# Patient Record
Sex: Female | Born: 1955 | Hispanic: No | Smoking: Former smoker
Health system: Southern US, Community
[De-identification: ages and names within clinical notes are randomized; demographics above are authoritative.]

## PROBLEM LIST (undated history)

## (undated) DIAGNOSIS — Z9989 Dependence on other enabling machines and devices: Secondary | ICD-10-CM

## (undated) DIAGNOSIS — N182 Chronic kidney disease, stage 2 (mild): Secondary | ICD-10-CM

## (undated) DIAGNOSIS — H53453 Other localized visual field defect, bilateral: Secondary | ICD-10-CM

## (undated) DIAGNOSIS — Z972 Presence of dental prosthetic device (complete) (partial): Secondary | ICD-10-CM

## (undated) DIAGNOSIS — R531 Weakness: Secondary | ICD-10-CM

## (undated) DIAGNOSIS — I10 Essential (primary) hypertension: Secondary | ICD-10-CM

## (undated) DIAGNOSIS — N200 Calculus of kidney: Secondary | ICD-10-CM

## (undated) DIAGNOSIS — I6930 Unspecified sequelae of cerebral infarction: Secondary | ICD-10-CM

## (undated) DIAGNOSIS — F419 Anxiety disorder, unspecified: Secondary | ICD-10-CM

## (undated) HISTORY — DX: Weakness: R53.1

## (undated) HISTORY — PX: TROCHANTERIC BURSA EXCISION: SHX2581

## (undated) HISTORY — DX: Anxiety disorder, unspecified: F41.9

---

## 1995-04-27 HISTORY — PX: BREAST LUMPECTOMY: SHX2

## 2000-04-26 HISTORY — PX: LUMBAR SPINE SURGERY: SHX701

## 2005-04-26 DIAGNOSIS — I639 Cerebral infarction, unspecified: Secondary | ICD-10-CM

## 2005-04-26 HISTORY — DX: Cerebral infarction, unspecified: I63.9

## 2005-12-25 DIAGNOSIS — I6930 Unspecified sequelae of cerebral infarction: Secondary | ICD-10-CM

## 2005-12-25 HISTORY — DX: Unspecified sequelae of cerebral infarction: I69.30

## 2006-01-04 ENCOUNTER — Encounter: Admission: RE | Admit: 2006-01-04 | Discharge: 2006-03-14 | Payer: Self-pay | Admitting: Family Medicine

## 2008-04-26 HISTORY — PX: FOOT SURGERY: SHX648

## 2010-09-25 ENCOUNTER — Other Ambulatory Visit (HOSPITAL_COMMUNITY): Payer: Self-pay | Admitting: Podiatry

## 2010-09-25 DIAGNOSIS — S92902A Unspecified fracture of left foot, initial encounter for closed fracture: Secondary | ICD-10-CM

## 2010-10-05 ENCOUNTER — Encounter (HOSPITAL_COMMUNITY): Payer: Self-pay

## 2010-10-05 ENCOUNTER — Inpatient Hospital Stay (HOSPITAL_COMMUNITY): Admission: RE | Admit: 2010-10-05 | Payer: Self-pay | Source: Ambulatory Visit

## 2010-10-20 ENCOUNTER — Other Ambulatory Visit (HOSPITAL_COMMUNITY): Payer: Self-pay

## 2010-10-20 ENCOUNTER — Encounter (HOSPITAL_COMMUNITY): Payer: Self-pay

## 2010-11-12 ENCOUNTER — Other Ambulatory Visit: Payer: Self-pay | Admitting: Neurology

## 2010-11-12 DIAGNOSIS — M545 Low back pain: Secondary | ICD-10-CM

## 2010-11-18 ENCOUNTER — Ambulatory Visit
Admission: RE | Admit: 2010-11-18 | Discharge: 2010-11-18 | Disposition: A | Discharge status: Home / Self Care | Payer: Medicare Other | Source: Ambulatory Visit | Attending: Neurology | Admitting: Neurology

## 2010-11-18 DIAGNOSIS — M545 Low back pain: Secondary | ICD-10-CM

## 2010-11-18 MED ORDER — GADOBENATE DIMEGLUMINE 529 MG/ML IV SOLN
18.0000 mL | Freq: Once | INTRAVENOUS | Status: AC | PRN
  Administered 2010-11-18: 18 mL via INTRAVENOUS

## 2010-11-20 ENCOUNTER — Other Ambulatory Visit: Payer: Self-pay | Admitting: Neurology

## 2010-11-20 DIAGNOSIS — M5410 Radiculopathy, site unspecified: Secondary | ICD-10-CM

## 2010-11-24 ENCOUNTER — Ambulatory Visit
Admission: RE | Admit: 2010-11-24 | Discharge: 2010-11-24 | Disposition: A | Discharge status: Home / Self Care | Payer: Medicare Other | Source: Ambulatory Visit | Attending: Neurology | Admitting: Neurology

## 2010-11-24 DIAGNOSIS — M5410 Radiculopathy, site unspecified: Secondary | ICD-10-CM

## 2010-11-24 MED ORDER — IOHEXOL 180 MG/ML  SOLN
1.0000 mL | Freq: Once | INTRAMUSCULAR | Status: AC | PRN
  Administered 2010-11-24: 1 mL via EPIDURAL

## 2010-11-24 MED ORDER — METHYLPREDNISOLONE ACETATE 40 MG/ML INJ SUSP (RADIOLOG
120.0000 mg | Freq: Once | INTRAMUSCULAR | Status: AC
  Administered 2010-11-24: 120 mg via EPIDURAL

## 2011-08-17 ENCOUNTER — Other Ambulatory Visit (HOSPITAL_COMMUNITY)
Admission: RE | Admit: 2011-08-17 | Discharge: 2011-08-17 | Disposition: A | Discharge status: Home / Self Care | Payer: Medicare Other | Source: Ambulatory Visit | Attending: Family Medicine | Admitting: Family Medicine

## 2011-08-17 ENCOUNTER — Other Ambulatory Visit (HOSPITAL_COMMUNITY): Payer: Self-pay | Admitting: Family Medicine

## 2011-08-17 DIAGNOSIS — Z124 Encounter for screening for malignant neoplasm of cervix: Secondary | ICD-10-CM | POA: Insufficient documentation

## 2011-08-17 DIAGNOSIS — Z1231 Encounter for screening mammogram for malignant neoplasm of breast: Secondary | ICD-10-CM

## 2011-09-09 ENCOUNTER — Ambulatory Visit (HOSPITAL_COMMUNITY)
Admission: RE | Admit: 2011-09-09 | Discharge: 2011-09-09 | Disposition: A | Discharge status: Home / Self Care | Payer: Medicare Other | Source: Ambulatory Visit | Attending: Family Medicine | Admitting: Family Medicine

## 2011-09-09 DIAGNOSIS — Z1231 Encounter for screening mammogram for malignant neoplasm of breast: Secondary | ICD-10-CM | POA: Insufficient documentation

## 2011-11-17 ENCOUNTER — Other Ambulatory Visit (HOSPITAL_COMMUNITY): Payer: Self-pay | Admitting: Internal Medicine

## 2011-11-17 DIAGNOSIS — M25552 Pain in left hip: Secondary | ICD-10-CM

## 2011-11-21 ENCOUNTER — Ambulatory Visit
Admission: RE | Admit: 2011-11-21 | Discharge: 2011-11-21 | Disposition: A | Discharge status: Home / Self Care | Payer: Medicare Other | Source: Ambulatory Visit | Attending: Internal Medicine | Admitting: Internal Medicine

## 2011-11-21 DIAGNOSIS — M25552 Pain in left hip: Secondary | ICD-10-CM

## 2012-07-24 ENCOUNTER — Other Ambulatory Visit: Payer: Self-pay | Admitting: Internal Medicine

## 2012-07-24 DIAGNOSIS — E2839 Other primary ovarian failure: Secondary | ICD-10-CM

## 2012-09-04 ENCOUNTER — Other Ambulatory Visit (HOSPITAL_COMMUNITY): Payer: Self-pay | Admitting: Internal Medicine

## 2012-09-04 DIAGNOSIS — Z1231 Encounter for screening mammogram for malignant neoplasm of breast: Secondary | ICD-10-CM

## 2012-09-13 ENCOUNTER — Ambulatory Visit (HOSPITAL_COMMUNITY)
Admission: RE | Admit: 2012-09-13 | Discharge: 2012-09-13 | Disposition: A | Discharge status: Home / Self Care | Payer: Medicare Other | Source: Ambulatory Visit | Attending: Internal Medicine | Admitting: Internal Medicine

## 2012-09-13 DIAGNOSIS — Z1231 Encounter for screening mammogram for malignant neoplasm of breast: Secondary | ICD-10-CM

## 2012-09-13 DIAGNOSIS — E2839 Other primary ovarian failure: Secondary | ICD-10-CM

## 2012-09-13 DIAGNOSIS — Z780 Asymptomatic menopausal state: Secondary | ICD-10-CM | POA: Insufficient documentation

## 2012-09-13 DIAGNOSIS — Z13820 Encounter for screening for osteoporosis: Secondary | ICD-10-CM | POA: Insufficient documentation

## 2012-09-26 ENCOUNTER — Other Ambulatory Visit: Payer: Self-pay | Admitting: Orthopaedic Surgery

## 2012-09-26 DIAGNOSIS — M25552 Pain in left hip: Secondary | ICD-10-CM

## 2012-10-03 ENCOUNTER — Encounter: Payer: Self-pay | Admitting: Obstetrics & Gynecology

## 2012-10-05 ENCOUNTER — Ambulatory Visit
Admission: RE | Admit: 2012-10-05 | Discharge: 2012-10-05 | Disposition: A | Discharge status: Home / Self Care | Payer: Medicare Other | Source: Ambulatory Visit | Attending: Orthopaedic Surgery | Admitting: Orthopaedic Surgery

## 2012-10-05 DIAGNOSIS — M25552 Pain in left hip: Secondary | ICD-10-CM

## 2012-11-01 ENCOUNTER — Encounter: Payer: Self-pay | Admitting: Obstetrics

## 2012-11-01 ENCOUNTER — Ambulatory Visit (INDEPENDENT_AMBULATORY_CARE_PROVIDER_SITE_OTHER): Payer: Medicare Other | Admitting: Obstetrics

## 2012-11-01 ENCOUNTER — Ambulatory Visit: Payer: Medicare Other | Admitting: *Deleted

## 2012-11-01 VITALS — BP 114/81 | HR 93 | Temp 97.5°F | Wt 160.0 lb

## 2012-11-01 DIAGNOSIS — N760 Acute vaginitis: Secondary | ICD-10-CM

## 2012-11-01 DIAGNOSIS — Z01419 Encounter for gynecological examination (general) (routine) without abnormal findings: Secondary | ICD-10-CM

## 2012-11-01 NOTE — Progress Notes (Signed)
Subjective:     Sierra Krueger is a 57 y.o. female here for a routine exam.  Current complaints: none.  Personal health questionnaire reviewed: not asked.   Gynecologic History No LMP recorded. Patient is postmenopausal. Contraception: post menopausal status Last Pap: 2013. Results were: unknown Last mammogram: 09/2012. Results were: normal  Obstetric History OB History   Grav Para Term Preterm Abortions TAB SAB Ect Mult Living                   The following portions of the patient's history were reviewed and updated as appropriate: allergies, current medications, past family history, past medical history, past social history, past surgical history and problem list.  Review of Systems Pertinent items are noted in HPI.    Objective:    General appearance: alert and no distress Breasts: normal appearance, no masses or tenderness Abdomen: normal findings: soft, non-tender Pelvic: cervix normal in appearance, external genitalia normal, no adnexal masses or tenderness, no cervical motion tenderness, uterus normal size, shape, and consistency and vagina normal without discharge    Assessment:     Healthy female exam.    Plan:    Follow up in: 1 year.

## 2012-11-01 NOTE — Addendum Note (Signed)
Addended by: Julaine Hua on: 11/01/2012 11:24 AM   Modules accepted: Orders

## 2012-11-03 LAB — PAP IG W/ RFLX HPV ASCU

## 2012-11-06 ENCOUNTER — Other Ambulatory Visit: Payer: Self-pay | Admitting: *Deleted

## 2012-11-06 DIAGNOSIS — B9689 Other specified bacterial agents as the cause of diseases classified elsewhere: Secondary | ICD-10-CM

## 2012-11-06 MED ORDER — METRONIDAZOLE 500 MG PO TABS
500.0000 mg | ORAL_TABLET | Freq: Two times a day (BID) | ORAL | Status: AC
Start: 2012-11-06 — End: 2012-11-13

## 2012-11-06 NOTE — Progress Notes (Signed)
Pt aware of results and prescription sent to pt's pharmacy. Pt expressed understanding.   

## 2013-08-14 ENCOUNTER — Other Ambulatory Visit (HOSPITAL_COMMUNITY): Payer: Self-pay | Admitting: Internal Medicine

## 2013-08-14 DIAGNOSIS — Z1231 Encounter for screening mammogram for malignant neoplasm of breast: Secondary | ICD-10-CM

## 2013-08-23 ENCOUNTER — Ambulatory Visit: Payer: Medicare Other | Attending: Orthopaedic Surgery

## 2013-08-23 DIAGNOSIS — M25569 Pain in unspecified knee: Secondary | ICD-10-CM | POA: Insufficient documentation

## 2013-08-23 DIAGNOSIS — R269 Unspecified abnormalities of gait and mobility: Secondary | ICD-10-CM | POA: Insufficient documentation

## 2013-08-23 DIAGNOSIS — IMO0001 Reserved for inherently not codable concepts without codable children: Secondary | ICD-10-CM | POA: Diagnosis present

## 2013-08-29 ENCOUNTER — Ambulatory Visit: Payer: Medicare Other | Attending: Orthopaedic Surgery | Admitting: Rehabilitation

## 2013-08-29 DIAGNOSIS — R269 Unspecified abnormalities of gait and mobility: Secondary | ICD-10-CM | POA: Diagnosis not present

## 2013-08-29 DIAGNOSIS — IMO0001 Reserved for inherently not codable concepts without codable children: Secondary | ICD-10-CM | POA: Diagnosis present

## 2013-08-29 DIAGNOSIS — M25569 Pain in unspecified knee: Secondary | ICD-10-CM | POA: Insufficient documentation

## 2013-08-31 ENCOUNTER — Ambulatory Visit: Payer: Medicare Other

## 2013-08-31 DIAGNOSIS — IMO0001 Reserved for inherently not codable concepts without codable children: Secondary | ICD-10-CM | POA: Diagnosis not present

## 2013-09-03 ENCOUNTER — Ambulatory Visit: Payer: Medicare Other

## 2013-09-03 DIAGNOSIS — IMO0001 Reserved for inherently not codable concepts without codable children: Secondary | ICD-10-CM | POA: Diagnosis not present

## 2013-09-11 ENCOUNTER — Encounter: Payer: Medicare Other | Admitting: Rehabilitation

## 2013-09-18 ENCOUNTER — Ambulatory Visit (HOSPITAL_COMMUNITY): Payer: Medicare Other

## 2013-09-19 ENCOUNTER — Encounter: Payer: Medicare Other | Admitting: Rehabilitation

## 2013-09-28 ENCOUNTER — Ambulatory Visit (HOSPITAL_COMMUNITY)
Admission: RE | Admit: 2013-09-28 | Discharge: 2013-09-28 | Disposition: A | Discharge status: Home / Self Care | Payer: Medicare Other | Source: Ambulatory Visit | Attending: Internal Medicine | Admitting: Internal Medicine

## 2013-09-28 DIAGNOSIS — Z1231 Encounter for screening mammogram for malignant neoplasm of breast: Secondary | ICD-10-CM | POA: Insufficient documentation

## 2013-11-01 ENCOUNTER — Ambulatory Visit: Payer: Medicare Other | Admitting: Obstetrics

## 2014-07-31 DIAGNOSIS — M25552 Pain in left hip: Secondary | ICD-10-CM | POA: Diagnosis not present

## 2014-08-20 DIAGNOSIS — M5136 Other intervertebral disc degeneration, lumbar region: Secondary | ICD-10-CM | POA: Diagnosis not present

## 2014-08-20 DIAGNOSIS — F419 Anxiety disorder, unspecified: Secondary | ICD-10-CM | POA: Diagnosis not present

## 2014-08-20 DIAGNOSIS — Z131 Encounter for screening for diabetes mellitus: Secondary | ICD-10-CM | POA: Diagnosis not present

## 2014-08-20 DIAGNOSIS — I10 Essential (primary) hypertension: Secondary | ICD-10-CM | POA: Diagnosis not present

## 2014-08-20 DIAGNOSIS — Z13220 Encounter for screening for lipoid disorders: Secondary | ICD-10-CM | POA: Diagnosis not present

## 2014-08-20 DIAGNOSIS — M722 Plantar fascial fibromatosis: Secondary | ICD-10-CM | POA: Diagnosis not present

## 2014-08-20 DIAGNOSIS — K219 Gastro-esophageal reflux disease without esophagitis: Secondary | ICD-10-CM | POA: Diagnosis not present

## 2014-09-09 ENCOUNTER — Other Ambulatory Visit (HOSPITAL_COMMUNITY): Payer: Self-pay | Admitting: Internal Medicine

## 2014-09-09 DIAGNOSIS — Z1231 Encounter for screening mammogram for malignant neoplasm of breast: Secondary | ICD-10-CM

## 2014-09-16 DIAGNOSIS — M7752 Other enthesopathy of left foot: Secondary | ICD-10-CM | POA: Diagnosis not present

## 2014-09-16 DIAGNOSIS — G5762 Lesion of plantar nerve, left lower limb: Secondary | ICD-10-CM | POA: Diagnosis not present

## 2014-09-16 DIAGNOSIS — M7989 Other specified soft tissue disorders: Secondary | ICD-10-CM | POA: Diagnosis not present

## 2014-10-02 ENCOUNTER — Ambulatory Visit (HOSPITAL_COMMUNITY)
Admission: RE | Admit: 2014-10-02 | Discharge: 2014-10-02 | Disposition: A | Discharge status: Home / Self Care | Payer: Medicare Other | Source: Ambulatory Visit | Attending: Internal Medicine | Admitting: Internal Medicine

## 2014-10-02 DIAGNOSIS — Z1231 Encounter for screening mammogram for malignant neoplasm of breast: Secondary | ICD-10-CM | POA: Diagnosis not present

## 2014-10-03 DIAGNOSIS — M7752 Other enthesopathy of left foot: Secondary | ICD-10-CM | POA: Diagnosis not present

## 2014-10-03 DIAGNOSIS — G5762 Lesion of plantar nerve, left lower limb: Secondary | ICD-10-CM | POA: Diagnosis not present

## 2014-10-03 DIAGNOSIS — M792 Neuralgia and neuritis, unspecified: Secondary | ICD-10-CM | POA: Diagnosis not present

## 2014-10-17 DIAGNOSIS — M7989 Other specified soft tissue disorders: Secondary | ICD-10-CM | POA: Diagnosis not present

## 2014-10-17 DIAGNOSIS — G5762 Lesion of plantar nerve, left lower limb: Secondary | ICD-10-CM | POA: Diagnosis not present

## 2014-10-17 DIAGNOSIS — M7752 Other enthesopathy of left foot: Secondary | ICD-10-CM | POA: Diagnosis not present

## 2014-10-21 DIAGNOSIS — H35032 Hypertensive retinopathy, left eye: Secondary | ICD-10-CM | POA: Diagnosis not present

## 2014-10-21 DIAGNOSIS — H35031 Hypertensive retinopathy, right eye: Secondary | ICD-10-CM | POA: Diagnosis not present

## 2014-11-11 DIAGNOSIS — M25552 Pain in left hip: Secondary | ICD-10-CM | POA: Diagnosis not present

## 2014-11-13 ENCOUNTER — Other Ambulatory Visit: Payer: Self-pay | Admitting: Orthopaedic Surgery

## 2014-11-13 DIAGNOSIS — M25552 Pain in left hip: Secondary | ICD-10-CM

## 2014-11-17 ENCOUNTER — Ambulatory Visit
Admission: RE | Admit: 2014-11-17 | Discharge: 2014-11-17 | Disposition: A | Discharge status: Home / Self Care | Payer: Medicare Other | Source: Ambulatory Visit | Attending: Orthopaedic Surgery | Admitting: Orthopaedic Surgery

## 2014-11-17 DIAGNOSIS — M5137 Other intervertebral disc degeneration, lumbosacral region: Secondary | ICD-10-CM | POA: Diagnosis not present

## 2014-11-17 DIAGNOSIS — M94251 Chondromalacia, right hip: Secondary | ICD-10-CM | POA: Diagnosis not present

## 2014-11-17 DIAGNOSIS — M160 Bilateral primary osteoarthritis of hip: Secondary | ICD-10-CM | POA: Diagnosis not present

## 2014-11-17 DIAGNOSIS — M25552 Pain in left hip: Secondary | ICD-10-CM

## 2014-11-17 DIAGNOSIS — M94252 Chondromalacia, left hip: Secondary | ICD-10-CM | POA: Diagnosis not present

## 2014-11-19 DIAGNOSIS — I10 Essential (primary) hypertension: Secondary | ICD-10-CM | POA: Diagnosis not present

## 2014-11-19 DIAGNOSIS — I6789 Other cerebrovascular disease: Secondary | ICD-10-CM | POA: Diagnosis not present

## 2014-11-19 DIAGNOSIS — M7062 Trochanteric bursitis, left hip: Secondary | ICD-10-CM | POA: Diagnosis not present

## 2014-11-19 DIAGNOSIS — Z13220 Encounter for screening for lipoid disorders: Secondary | ICD-10-CM | POA: Diagnosis not present

## 2014-11-19 DIAGNOSIS — K219 Gastro-esophageal reflux disease without esophagitis: Secondary | ICD-10-CM | POA: Diagnosis not present

## 2014-11-25 DIAGNOSIS — M25552 Pain in left hip: Secondary | ICD-10-CM | POA: Diagnosis not present

## 2015-02-06 DIAGNOSIS — G5732 Lesion of lateral popliteal nerve, left lower limb: Secondary | ICD-10-CM | POA: Diagnosis not present

## 2015-02-06 DIAGNOSIS — M79672 Pain in left foot: Secondary | ICD-10-CM | POA: Diagnosis not present

## 2015-02-06 DIAGNOSIS — I639 Cerebral infarction, unspecified: Secondary | ICD-10-CM | POA: Diagnosis not present

## 2015-02-06 DIAGNOSIS — M5417 Radiculopathy, lumbosacral region: Secondary | ICD-10-CM | POA: Diagnosis not present

## 2015-02-06 DIAGNOSIS — M2142 Flat foot [pes planus] (acquired), left foot: Secondary | ICD-10-CM | POA: Diagnosis not present

## 2015-03-07 DIAGNOSIS — I10 Essential (primary) hypertension: Secondary | ICD-10-CM | POA: Diagnosis not present

## 2015-03-07 DIAGNOSIS — M5136 Other intervertebral disc degeneration, lumbar region: Secondary | ICD-10-CM | POA: Diagnosis not present

## 2015-03-07 DIAGNOSIS — Z23 Encounter for immunization: Secondary | ICD-10-CM | POA: Diagnosis not present

## 2015-03-07 DIAGNOSIS — M7062 Trochanteric bursitis, left hip: Secondary | ICD-10-CM | POA: Diagnosis not present

## 2015-03-07 DIAGNOSIS — K219 Gastro-esophageal reflux disease without esophagitis: Secondary | ICD-10-CM | POA: Diagnosis not present

## 2015-03-19 DIAGNOSIS — G603 Idiopathic progressive neuropathy: Secondary | ICD-10-CM | POA: Diagnosis not present

## 2015-03-19 DIAGNOSIS — G541 Lumbosacral plexus disorders: Secondary | ICD-10-CM | POA: Diagnosis not present

## 2015-03-19 DIAGNOSIS — M5441 Lumbago with sciatica, right side: Secondary | ICD-10-CM | POA: Diagnosis not present

## 2015-03-19 DIAGNOSIS — G8929 Other chronic pain: Secondary | ICD-10-CM | POA: Diagnosis not present

## 2015-03-19 DIAGNOSIS — M545 Low back pain: Secondary | ICD-10-CM | POA: Diagnosis not present

## 2015-04-04 DIAGNOSIS — G890 Central pain syndrome: Secondary | ICD-10-CM | POA: Diagnosis not present

## 2015-04-16 DIAGNOSIS — M25562 Pain in left knee: Secondary | ICD-10-CM | POA: Diagnosis not present

## 2015-04-16 DIAGNOSIS — G894 Chronic pain syndrome: Secondary | ICD-10-CM | POA: Diagnosis not present

## 2015-04-16 DIAGNOSIS — Z79891 Long term (current) use of opiate analgesic: Secondary | ICD-10-CM | POA: Diagnosis not present

## 2015-09-08 ENCOUNTER — Other Ambulatory Visit: Payer: Self-pay | Admitting: Internal Medicine

## 2015-09-08 DIAGNOSIS — E2839 Other primary ovarian failure: Secondary | ICD-10-CM

## 2015-09-08 DIAGNOSIS — Z1231 Encounter for screening mammogram for malignant neoplasm of breast: Secondary | ICD-10-CM

## 2015-09-17 ENCOUNTER — Ambulatory Visit: Payer: Self-pay | Admitting: Certified Nurse Midwife

## 2015-10-03 ENCOUNTER — Ambulatory Visit
Admission: RE | Admit: 2015-10-03 | Discharge: 2015-10-03 | Disposition: A | Discharge status: Home / Self Care | Payer: Medicare Other | Source: Ambulatory Visit | Attending: Internal Medicine | Admitting: Internal Medicine

## 2015-10-03 DIAGNOSIS — E2839 Other primary ovarian failure: Secondary | ICD-10-CM

## 2015-10-03 DIAGNOSIS — Z1231 Encounter for screening mammogram for malignant neoplasm of breast: Secondary | ICD-10-CM

## 2015-10-10 ENCOUNTER — Ambulatory Visit (INDEPENDENT_AMBULATORY_CARE_PROVIDER_SITE_OTHER): Payer: Medicare Other | Admitting: Certified Nurse Midwife

## 2015-10-10 ENCOUNTER — Encounter: Payer: Self-pay | Admitting: Certified Nurse Midwife

## 2015-10-10 VITALS — BP 121/86 | HR 86 | Temp 97.7°F | Wt 168.0 lb

## 2015-10-10 DIAGNOSIS — Z01419 Encounter for gynecological examination (general) (routine) without abnormal findings: Secondary | ICD-10-CM | POA: Diagnosis not present

## 2015-10-10 NOTE — Progress Notes (Signed)
Patient ID: Sierra Krueger, female   DOB: Mar 20, 1956, 60 y.o.   MRN: [id number]    Subjective:      Sierra Krueger is a 60 y.o. female here for a routine exam.  Current complaints: none.  Postmenopausal status.  Is having issues with hot flashes, at night.  Takes black cohosh, and states that it works well for her.    Not currently sexually active. On disability since CVA.  Dr. Suzy Krueger is her PCP; needs dermatology skin exam.  Declines blood STD testing.  Personal health questionnaire:  Is patient Ashkenazi Jewish, have a family history of breast and/or ovarian cancer: no Is there a family history of uterine cancer diagnosed at age < 54, gastrointestinal cancer, urinary tract cancer, family member who is a Field seismologist syndrome-associated carrier: no Is the patient overweight and hypertensive, family history of diabetes, personal history of gestational diabetes, preeclampsia or PCOS: no Is patient over 31, have PCOS,  family history of premature CHD under age 51, diabetes, smoke, have hypertension or peripheral artery disease:  Yes, patient had a CVA At any time, has a partner hit, kicked or otherwise hurt or frightened you?: no Over the past 2 weeks, have you felt down, depressed or hopeless?: no Over the past 2 weeks, have you felt little interest or pleasure in doing things?:no   Gynecologic History No LMP recorded. Patient is postmenopausal. Contraception: post menopausal status Last Pap: 7/14. Results were: normal Last mammogram: 10/06/15. Results were: normal  Obstetric History OB History  No data available    Past Medical History  Diagnosis Date  . Stroke [facility]) 2007  . Left-sided weakness   . Anxiety     Past Surgical History  Procedure Laterality Date  . Foot surgery Left 2010  . Back surgery  2002    L3,4,5   . Breast lumpectomy Left 1997    benign tumor      Current outpatient prescriptions:  .  aspirin 325 MG tablet, Take 325 mg by mouth daily., Disp: , Rfl:  .  BLACK COHOSH  PO, Take 1 capsule by mouth 2 (two) times daily., Disp: , Rfl:  .  Calcium Carbonate-Vitamin D (CALCIUM + D PO), Take 1 capsule by mouth daily., Disp: , Rfl:  .  clonazePAM (KLONOPIN) 1 MG tablet, Take 1 mg by mouth as needed for anxiety., Disp: , Rfl:  .  traMADol (ULTRAM) 50 MG tablet, Take 50 mg by mouth every 6 (six) hours as needed for pain., Disp: , Rfl:  .  zolpidem (AMBIEN) 5 MG tablet, Take 5 mg by mouth at bedtime as needed for sleep., Disp: , Rfl:  .  Multiple Vitamin (MULTIVITAMIN) tablet, Take 1 tablet by mouth daily., Disp: , Rfl:  Allergies  Allergen Reactions  . Codeine Hives  . Morphine And Related Itching and Nausea And Vomiting    Social History  Substance Use Topics  . Smoking status: Former Smoker    Types: Cigarettes    Quit date: 11/01/2008  . Smokeless tobacco: Never Used  . Alcohol Use: Yes     Comment: rare    No family history on file.    Review of Systems  Constitutional: negative for fatigue and weight loss Respiratory: negative for cough and wheezing Cardiovascular: negative for chest pain, fatigue and palpitations Gastrointestinal: negative for abdominal pain and change in bowel habits Musculoskeletal:negative for myalgias Neurological: negative for gait problems and tremors Behavioral/Psych: negative for abusive relationship, depression Endocrine: negative for temperature intolerance   Genitourinary:negative for  abnormal menstrual periods, genital lesions, hot flashes, sexual problems and vaginal discharge Integument/breast: negative for breast lump, breast tenderness, nipple discharge and skin lesion(s)    Objective:       BP 121/86 mmHg  Pulse 86  Temp(Src) 97.7 F (36.5 C)  Wt 168 lb (76.204 kg) General:   alert  Skin:   no rash or abnormalities  Lungs:   clear to auscultation bilaterally  Heart:   regular rate and rhythm, S1, S2 normal, no murmur, click, rub or gallop  Breasts:   normal without suspicious masses, skin or nipple  changes or axillary nodes  Abdomen:  normal findings: no organomegaly, soft, non-tender and no hernia  Pelvis:  External genitalia: normal general appearance Urinary system: urethral meatus normal and bladder without fullness, nontender Vaginal: normal without tenderness, induration or masses Cervix: normal appearance Adnexa: normal bimanual exam Uterus: anteverted and non-tender, normal size   Lab Review Urine pregnancy test Labs reviewed yes Radiologic studies reviewed yes  50% of 30 min visit spent on counseling and coordination of care.   Assessment:    Healthy female exam.   Post menopausal status  Plan:    Education reviewed: calcium supplements, depression evaluation, low fat, low cholesterol diet, safe sex/STD prevention, self breast exams, skin cancer screening and weight bearing exercise. Hormone replacement therapy: OTC black cohosh. Follow up in: 1 year. Up to date on mammogram   No orders of the defined types were placed in this encounter.   No orders of the defined types were placed in this encounter.    Follow up as needed or in 2 years for routine exam..

## 2015-10-13 LAB — NUSWAB VG, CANDIDA 6SP
Atopobium vaginae: HIGH Score — AB
CANDIDA ALBICANS, NAA: NEGATIVE
CANDIDA GLABRATA, NAA: NEGATIVE
CANDIDA PARAPSILOSIS, NAA: NEGATIVE
CANDIDA TROPICALIS, NAA: NEGATIVE
Candida krusei, NAA: NEGATIVE
Candida lusitaniae, NAA: NEGATIVE
Trich vag by NAA: NEGATIVE

## 2015-10-14 ENCOUNTER — Other Ambulatory Visit: Payer: Self-pay | Admitting: Certified Nurse Midwife

## 2015-10-14 DIAGNOSIS — B9689 Other specified bacterial agents as the cause of diseases classified elsewhere: Secondary | ICD-10-CM

## 2015-10-14 DIAGNOSIS — N760 Acute vaginitis: Principal | ICD-10-CM

## 2015-10-14 LAB — PAP IG AND HPV HIGH-RISK
HPV, high-risk: POSITIVE — AB
PAP SMEAR COMMENT: 0

## 2015-10-14 MED ORDER — METRONIDAZOLE 500 MG PO TABS: 500.0000 mg | ORAL_TABLET | Freq: Two times a day (BID) | ORAL | Status: DC

## 2015-10-16 ENCOUNTER — Encounter: Payer: Self-pay | Admitting: *Deleted

## 2015-10-17 LAB — SPECIMEN STATUS REPORT

## 2015-10-17 LAB — HPV GENOTYPES 16/18,45
HPV GENOTYPE 16: NEGATIVE
HPV Genotype 18,45: NEGATIVE

## 2016-03-17 ENCOUNTER — Ambulatory Visit (INDEPENDENT_AMBULATORY_CARE_PROVIDER_SITE_OTHER): Payer: Medicare Other | Admitting: Orthopaedic Surgery

## 2016-03-17 ENCOUNTER — Encounter (INDEPENDENT_AMBULATORY_CARE_PROVIDER_SITE_OTHER): Payer: Self-pay

## 2016-03-17 DIAGNOSIS — M7062 Trochanteric bursitis, left hip: Secondary | ICD-10-CM | POA: Diagnosis not present

## 2016-03-17 MED ORDER — LIDOCAINE HCL 1 % IJ SOLN
3.0000 mL | INTRAMUSCULAR | Status: AC | PRN
  Administered 2016-03-17: 3 mL

## 2016-03-17 MED ORDER — METHYLPREDNISOLONE ACETATE 40 MG/ML IJ SUSP
40.0000 mg | INTRAMUSCULAR | Status: AC | PRN
  Administered 2016-03-17: 40 mg via INTRA_ARTICULAR

## 2016-03-17 NOTE — Progress Notes (Signed)
Office Visit Note   Patient: Sierra Krueger           Date of Birth: 09-May-1955           MRN: [id number] Visit Date: 03/17/2016              Requested by: Nolene Ebbs, MD [place], [place] [address] PCP: Philis Fendt, MD   Assessment & Plan: Visit Diagnoses: No diagnosis found.  Plan: At this point we have done multiple injections over trochanteric area on the left hip, she has tried physical therapy, rest, ice, heat, activity modification and multiple other modalities. She was consider surgical intervention systems is been going on for over 7 years now. I told her about a bursectomy over this area and described what the surgery involves. She is interested in having this potentially in the future. Right now she would like [place] to follow-up again in 3 months which I think is reasonable. Been 6 months since her last injection.  Follow-Up Instructions: No Follow-up on file.   Orders:  No orders of the defined types were placed in this encounter.  No orders of the defined types were placed in this encounter.     Procedures: Large Joint Inj Date/Time: 03/17/2016 10:18 AM Performed by: Mcarthur Rossetti Authorized by: Mcarthur Rossetti   Indications:  Pain Location:  Hip Site:  L greater trochanter Ultrasound Guidance: No   Fluoroscopic Guidance: No   Arthrogram: No   Medications:  3 mL lidocaine 1 %; 40 mg methylPREDNISolone acetate 40 MG/ML     Clinical Data: No additional findings.   Subjective: Chief Complaint  Patient presents with  . Left Hip - Pain    Patient would like hip injection today. Her last injection was 09/08/15.    HPI She is having the pain that she is always had of her left trochanteric area. We've injected this area multiple times. It hurts mainly with activities and sleeping on that side and is just never gone away. Knee injections have helped temporize it. This is been going on for now over 7 years. At this point is  detrimental effect her activities daily living, her quality of life, and her mobility. Review of Systems Negative for headache, chest pain, shortness breath, fever, chills, nausea, vomiting.  Objective: Vital Signs: There were no vitals taken for this visit.  Physical Exam She is alert and oriented 3 in no acute distress robs discomfort other than left hip pain. Ortho Exam Examination of her left hip show severe pain over the left trochanteric area for the remainder of her hip exam is normal. Specialty Comments:  No specialty comments available.  Imaging: No results found.   PMFS History: There are no active problems to display for this patient.  Past Medical History:  Diagnosis Date  . Anxiety   . Left-sided weakness   . Stroke [facility]) 2007    No family history on file.  Past Surgical History:  Procedure Laterality Date  . BACK SURGERY  2002   L3,4,5   . BREAST LUMPECTOMY Left 1997   benign tumor   . FOOT SURGERY Left 2010   Social History   Occupational History  .  Disabled   Social History Main Topics  . Smoking status: Former Smoker    Types: Cigarettes    Quit date: 11/01/2008  . Smokeless tobacco: Never Used  . Alcohol use Yes     Comment: rare  . Drug use: No  .  Sexual activity: Not Currently

## 2016-05-18 ENCOUNTER — Ambulatory Visit (INDEPENDENT_AMBULATORY_CARE_PROVIDER_SITE_OTHER): Payer: Medicare Other | Admitting: Orthopaedic Surgery

## 2016-05-18 ENCOUNTER — Other Ambulatory Visit (INDEPENDENT_AMBULATORY_CARE_PROVIDER_SITE_OTHER): Payer: Self-pay | Admitting: Orthopaedic Surgery

## 2016-05-18 DIAGNOSIS — M7062 Trochanteric bursitis, left hip: Secondary | ICD-10-CM | POA: Diagnosis not present

## 2016-05-18 MED ORDER — ETODOLAC 500 MG PO TABS: 500.0000 mg | ORAL_TABLET | Freq: Two times a day (BID) | ORAL | 3 refills | Status: DC | PRN

## 2016-05-18 MED ORDER — METHYLPREDNISOLONE ACETATE 40 MG/ML IJ SUSP
40.0000 mg | INTRAMUSCULAR | Status: AC | PRN
  Administered 2016-05-18: 40 mg via INTRA_ARTICULAR

## 2016-05-18 MED ORDER — LIDOCAINE HCL 1 % IJ SOLN
3.0000 mL | INTRAMUSCULAR | Status: AC | PRN
  Administered 2016-05-18: 3 mL

## 2016-05-18 NOTE — Progress Notes (Signed)
   Office Visit Note   Patient: Sierra Krueger           Date of Birth: 02-10-1956           MRN: [id number] Visit Date: 05/18/2016              Requested by: Nolene Ebbs, MD [address] [place], [place] [address] PCP: Philis Fendt, MD   Assessment & Plan: Visit Diagnoses:  1. Trochanteric bursitis, left hip     Plan: She tolerated the left hip trochanteric injection well. We'll see her back in about 3 months to see how she is doing and see if she like to be set up for trochanteric bursectomy.  Follow-Up Instructions: Return in about 3 months (around 08/16/2016).   Orders:  Orders Placed This Encounter  Procedures  . Large Joint Injection/Arthrocentesis   No orders of the defined types were placed in this encounter.     Procedures: Large Joint Inj Date/Time: 05/18/2016 10:40 AM Performed by: Mcarthur Rossetti Authorized by: Mcarthur Rossetti   Location:  Hip Site:  L greater trochanter Ultrasound Guidance: No   Fluoroscopic Guidance: No   Arthrogram: No   Medications:  3 mL lidocaine 1 %; 40 mg methylPREDNISolone acetate 40 MG/ML     Clinical Data: No additional findings.   Subjective: No chief complaint on file.  HPI The patient is well-known to me. She has chronic left hip trochanteric bursitis that is been consistent with same over many years now. She says she may follow to surgery with the failure conservative treatment but not to the warmer weather. She would like to have injection today. Her pain is still only on the lateral aspect of her hip. Review of Systems  She denies any recent chest pain, shortness of breath, fever, chills, nausea, vomiting or headache.  Objective: Vital Signs: There were no vitals taken for this visit.  Physical Exam She is alert and oriented 3 and in no acute distress. She family with a cane. Ortho Exam Examination of her left hip shows full range of motion of the hip actively and passively. Her pain is only  over the trochanteric area. Specialty Comments:  No specialty comments available.  Imaging: No results found. An MRI in the past only shows mild arthritis of the left hip.  PMFS History: Patient Active Problem List   Diagnosis Date Noted  . Trochanteric bursitis, left hip 05/18/2016   Past Medical History:  Diagnosis Date  . Anxiety   . Left-sided weakness   . Stroke [facility]) 2007    No family history on file.  Past Surgical History:  Procedure Laterality Date  . BACK SURGERY  2002   L3,4,5   . BREAST LUMPECTOMY Left 1997   benign tumor   . FOOT SURGERY Left 2010   Social History   Occupational History  .  Disabled   Social History Main Topics  . Smoking status: Former Smoker    Types: Cigarettes    Quit date: 11/01/2008  . Smokeless tobacco: Never Used  . Alcohol use Yes     Comment: rare  . Drug use: No  . Sexual activity: Not Currently

## 2016-08-17 ENCOUNTER — Ambulatory Visit (INDEPENDENT_AMBULATORY_CARE_PROVIDER_SITE_OTHER): Payer: Medicare Other | Admitting: Orthopaedic Surgery

## 2016-08-17 DIAGNOSIS — M7062 Trochanteric bursitis, left hip: Secondary | ICD-10-CM

## 2016-08-17 MED ORDER — LIDOCAINE HCL 1 % IJ SOLN
3.0000 mL | INTRAMUSCULAR | Status: AC | PRN
  Administered 2016-08-17: 3 mL

## 2016-08-17 MED ORDER — METHYLPREDNISOLONE ACETATE 40 MG/ML IJ SUSP
40.0000 mg | INTRAMUSCULAR | Status: AC | PRN
  Administered 2016-08-17: 40 mg via INTRA_ARTICULAR

## 2016-08-17 NOTE — Progress Notes (Signed)
Office Visit Note   Patient: Sierra Krueger           Date of Birth: 1955-07-02           MRN: [id number] Visit Date: 08/17/2016              Requested by: Nolene Ebbs, MD [place], [place] [address] PCP: Philis Fendt, MD   Assessment & Plan: Visit Diagnoses: No diagnosis found.  Plan: She tolerated the trochanteric injection well and her left hip. At this point she is been dealing with this for over 7 years like to consider a trochanteric bursectomy of her left hip. I showed her hip model and explained in detail with the surgery involves. I do feel that she is a good candidate for this since injections have helped. I had a thorough discussion with her about the risk and benefits of surgery and what her postoperative course with involved. I gave her our surgery scheduler's card she was consider having this done in about a month or so once her daughter is out of school. I told her to give [place] a call and we would see her back in 2 weeks postoperative of the surgery. All questions were encouraged and answered.  Follow-Up Instructions: Return for 2 weeks post-op.   Orders:  No orders of the defined types were placed in this encounter.  No orders of the defined types were placed in this encounter.     Procedures: Large Joint Inj Date/Time: 08/17/2016 11:23 AM Performed by: Mcarthur Rossetti Authorized by: Mcarthur Rossetti   Location:  Hip Site:  L greater trochanter Ultrasound Guidance: No   Fluoroscopic Guidance: No   Arthrogram: No   Medications:  3 mL lidocaine 1 %; 40 mg methylPREDNISolone acetate 40 MG/ML     Clinical Data: No additional findings.   Subjective: No chief complaint on file. The patient is well-known to me. She has chronic left hip trochanteric bursitis and is been getting injections occasionally over time. She had one in January like to have one again today. She's been dealing with this rather 7 years now and I'll follow her  regularly for this. At this point she does wish proceed with the trochanteric bursectomy of her left hip in the near future.  HPI  Review of Systems She denies any chest pain, shortness of breath, fever, chills, nausea, vomiting.  Objective: Vital Signs: There were no vitals taken for this visit.  Physical Exam She is alert and oriented 3 and in no acute distress. She family with a cane. Ortho Exam She has fluid range of motion of her left hip with no pain in the groin. Her pain is only over the trochanteric area and IT band of her left side. Specialty Comments:  No specialty comments available.  Imaging: No results found.   PMFS History: Patient Active Problem List   Diagnosis Date Noted  . Trochanteric bursitis, left hip 05/18/2016   Past Medical History:  Diagnosis Date  . Anxiety   . Left-sided weakness   . Stroke [facility]) 2007    No family history on file.  Past Surgical History:  Procedure Laterality Date  . BACK SURGERY  2002   L3,4,5   . BREAST LUMPECTOMY Left 1997   benign tumor   . FOOT SURGERY Left 2010   Social History   Occupational History  .  Disabled   Social History Main Topics  . Smoking status: Former Smoker    Types:  Cigarettes    Quit date: 11/01/2008  . Smokeless tobacco: Never Used  . Alcohol use Yes     Comment: rare  . Drug use: No  . Sexual activity: Not Currently

## 2016-09-02 ENCOUNTER — Encounter: Payer: Self-pay | Admitting: Orthopaedic Surgery

## 2016-09-02 DIAGNOSIS — M7062 Trochanteric bursitis, left hip: Secondary | ICD-10-CM | POA: Diagnosis not present

## 2016-09-16 ENCOUNTER — Encounter (INDEPENDENT_AMBULATORY_CARE_PROVIDER_SITE_OTHER): Payer: Self-pay

## 2016-09-16 ENCOUNTER — Ambulatory Visit (INDEPENDENT_AMBULATORY_CARE_PROVIDER_SITE_OTHER): Payer: Medicare Other | Admitting: Orthopaedic Surgery

## 2016-09-16 DIAGNOSIS — M7062 Trochanteric bursitis, left hip: Secondary | ICD-10-CM

## 2016-09-16 NOTE — Progress Notes (Signed)
The patient is 2 weeks status post a trochanteric bursectomy of her left hip. She is doing well. She family with a cane. She had chronic bursitis for many years now. We did talk about postoperatively would expect from her recovery. On exam her incision looks great and I removed the old sutures and place new ones. There is no evidence infection at all. She is tender to be expected but she's had significant chronic pain with this hip.  We talked about still avoiding laying on her hip and no high impact aerobic activities for another month. I like see her back in 4 weeks as she doing overall. All questions were encouraged and answered.

## 2016-10-18 ENCOUNTER — Ambulatory Visit (INDEPENDENT_AMBULATORY_CARE_PROVIDER_SITE_OTHER): Payer: Medicare Other | Admitting: Orthopaedic Surgery

## 2016-10-18 DIAGNOSIS — M7062 Trochanteric bursitis, left hip: Secondary | ICD-10-CM

## 2016-10-18 MED ORDER — MELOXICAM 15 MG PO TABS: 15.0000 mg | ORAL_TABLET | Freq: Every day | ORAL | 3 refills | Status: DC

## 2016-10-18 NOTE — Progress Notes (Signed)
The patient is following up at 6 weeks status post a left hip trochanteric bursectomy. She says other than some inflammation when she sits too long she is doing great exam of the cane but overall feels that the surgery was successful.  On examination her incisions well-healed. She tolerates me pushing on the trochanteric area on her left side with much less pain.  At this point she is doing so well follow up as needed. Per her request I'll send in some more meloxicam.

## 2016-11-05 ENCOUNTER — Other Ambulatory Visit: Payer: Self-pay | Admitting: Internal Medicine

## 2016-11-05 DIAGNOSIS — Z1231 Encounter for screening mammogram for malignant neoplasm of breast: Secondary | ICD-10-CM

## 2016-11-12 ENCOUNTER — Ambulatory Visit
Admission: RE | Admit: 2016-11-12 | Discharge: 2016-11-12 | Disposition: A | Discharge status: Home / Self Care | Payer: Medicare Other | Source: Ambulatory Visit | Attending: Internal Medicine | Admitting: Internal Medicine

## 2016-11-12 DIAGNOSIS — Z1231 Encounter for screening mammogram for malignant neoplasm of breast: Secondary | ICD-10-CM

## 2017-01-24 ENCOUNTER — Ambulatory Visit (INDEPENDENT_AMBULATORY_CARE_PROVIDER_SITE_OTHER): Payer: Medicare Other | Admitting: Orthopaedic Surgery

## 2017-01-24 DIAGNOSIS — M7062 Trochanteric bursitis, left hip: Secondary | ICD-10-CM | POA: Diagnosis not present

## 2017-01-24 MED ORDER — METHYLPREDNISOLONE ACETATE 40 MG/ML IJ SUSP
40.0000 mg | INTRAMUSCULAR | Status: AC | PRN
  Administered 2017-01-24: 40 mg via INTRA_ARTICULAR

## 2017-01-24 MED ORDER — LIDOCAINE HCL 1 % IJ SOLN
3.0000 mL | INTRAMUSCULAR | Status: AC | PRN
  Administered 2017-01-24: 3 mL

## 2017-01-24 NOTE — Progress Notes (Signed)
Office Visit Note   Patient: Sierra Krueger           Date of Birth: 07/25/1955           MRN: [id number] Visit Date: 01/24/2017              Requested by: Nolene Ebbs, MD [address] [place], [place] [address] PCP: Nolene Ebbs, MD   Assessment & Plan: Visit Diagnoses:  1. Trochanteric bursitis, left hip     Plan: I talked her about trying another steroid injection in this area and she was amenable to this. She tolerated the injection well. I'll see her back in 3 weeks to see how she doing overall. If she still having a significant amount of pain I would then like an AP and lateral of that left hip. I would then likely consider an MRI.  Follow-Up Instructions: Return in about 3 weeks (around 02/14/2017).   Orders:  No orders of the defined types were placed in this encounter.  No orders of the defined types were placed in this encounter.     Procedures: Large Joint Inj Date/Time: 01/24/2017 4:27 PM Performed by: Mcarthur Rossetti Authorized by: Mcarthur Rossetti   Location:  Hip Site:  L greater trochanter Ultrasound Guidance: No   Fluoroscopic Guidance: No   Arthrogram: No   Medications:  3 mL lidocaine 1 %; 40 mg methylPREDNISolone acetate 40 MG/ML     Clinical Data: No additional findings.   Subjective: No chief complaint on file. Patient comes in today with chief complaint of left hip pain. This is a hip that has had chronic trochanteric bursitis for long period time and finally after years performed a trochanteric bursectomy. She said she has done well but then about 2 months ago started to get some pain on that side of the hip again. She denies any other change in her pain or change in her medical status. She does feel like that I can hip can be warm and swollen at times. She says she tries her stretching exercises as well. She has been using a cane in her opposite hand.  HPI  Review of Systems He currently denies any headache, chest  pain, shortness of breath, fever, chills, or vomiting  Objective: Vital Signs: There were no vitals taken for this visit.  Physical Exam She is alert and oriented 3 and in no acute distress Ortho Exam She is tender with palpation over her incision and trochanteric area on the left side. The incisions well-healed. There is no redness or warmth and I did not since there was any swelling in this area. Specialty Comments:  No specialty comments available.  Imaging: No results found.   PMFS History: Patient Active Problem List   Diagnosis Date Noted  . Trochanteric bursitis, left hip 05/18/2016   Past Medical History:  Diagnosis Date  . Anxiety   . Left-sided weakness   . Stroke [facility]) 2007    No family history on file.  Past Surgical History:  Procedure Laterality Date  . BACK SURGERY  2002   L3,4,5   . BREAST EXCISIONAL BIOPSY Left   . BREAST LUMPECTOMY Left 1997   benign tumor   . FOOT SURGERY Left 2010   Social History   Occupational History  .  Disabled   Social History Main Topics  . Smoking status: Former Smoker    Types: Cigarettes    Quit date: 11/01/2008  . Smokeless tobacco: Never Used  . Alcohol use Yes  Comment: rare  . Drug use: No  . Sexual activity: Not Currently

## 2017-02-14 ENCOUNTER — Ambulatory Visit (INDEPENDENT_AMBULATORY_CARE_PROVIDER_SITE_OTHER): Payer: Medicare Other | Admitting: Orthopaedic Surgery

## 2017-02-14 DIAGNOSIS — M7062 Trochanteric bursitis, left hip: Secondary | ICD-10-CM

## 2017-02-14 NOTE — Progress Notes (Signed)
Patient is continuing to follow-up for her left hip.  She had a trochanteric bursectomy time ago.  I injected the trochanteric area at her last visit with a steroid.  He said that helped greatly but is having pain more anterior is where she points to.  Examination I can easily palpate over trochanteric area with no pain at all and her incision is well-healed.  Her pain anterior muscles around the tensor fascia lata.  Injection around this area and we will see if we get a good response to healing.  We will see her back in 3 months to see how she is doing overall.

## 2017-05-17 ENCOUNTER — Ambulatory Visit (INDEPENDENT_AMBULATORY_CARE_PROVIDER_SITE_OTHER): Payer: Medicare Other | Admitting: Orthopaedic Surgery

## 2017-06-09 ENCOUNTER — Other Ambulatory Visit (INDEPENDENT_AMBULATORY_CARE_PROVIDER_SITE_OTHER): Payer: Self-pay | Admitting: Orthopaedic Surgery

## 2017-08-09 ENCOUNTER — Other Ambulatory Visit (INDEPENDENT_AMBULATORY_CARE_PROVIDER_SITE_OTHER): Payer: Self-pay | Admitting: Orthopaedic Surgery

## 2017-08-11 ENCOUNTER — Encounter (INDEPENDENT_AMBULATORY_CARE_PROVIDER_SITE_OTHER): Payer: Self-pay | Admitting: Orthopaedic Surgery

## 2017-08-11 ENCOUNTER — Other Ambulatory Visit (INDEPENDENT_AMBULATORY_CARE_PROVIDER_SITE_OTHER): Payer: Self-pay

## 2017-08-11 ENCOUNTER — Ambulatory Visit (INDEPENDENT_AMBULATORY_CARE_PROVIDER_SITE_OTHER): Payer: Medicare Other | Admitting: Orthopaedic Surgery

## 2017-08-11 DIAGNOSIS — M7062 Trochanteric bursitis, left hip: Secondary | ICD-10-CM

## 2017-08-11 DIAGNOSIS — M25552 Pain in left hip: Secondary | ICD-10-CM | POA: Diagnosis not present

## 2017-08-11 DIAGNOSIS — Z96642 Presence of left artificial hip joint: Secondary | ICD-10-CM

## 2017-08-11 NOTE — Progress Notes (Signed)
The patient is well-known to me.  She has chronic and severe left hip pain that we have treated for years now.  We have performed a trochanteric bursectomy about 2 years ago.  She still has severe pain of the trochanteric area but also pain in her thigh and in the groin.  She has had back surgery on the lumbar spine at several levels.  She does not feel that this is a spine issue at all and continues to be hip issue.  On exam she has significant pain still over the trochanteric area of her hip as well as in the thigh in the groin.  At this point I did provide a more steroid injection of the trochanteric area.  This is on the left side.  At this point an MRI is warranted to assess the cartilage of her left hip as well as the surrounding musculature of the proximal thigh and hip area based on the severity of her pain.  We will see her back in 2 weeks and go over that MRI.  Again I feel this is medically and clinically warranted given the severity of her pain is been going on for multiple years now and we have tried surgery in the trochanteric area we have tried physical therapy as well as anti-inflammatories and time.

## 2017-08-25 ENCOUNTER — Ambulatory Visit (INDEPENDENT_AMBULATORY_CARE_PROVIDER_SITE_OTHER): Payer: Medicare Other | Admitting: Orthopaedic Surgery

## 2017-08-26 ENCOUNTER — Ambulatory Visit
Admission: RE | Admit: 2017-08-26 | Discharge: 2017-08-26 | Disposition: A | Discharge status: Home / Self Care | Payer: Medicare Other | Source: Ambulatory Visit | Attending: Orthopaedic Surgery | Admitting: Orthopaedic Surgery

## 2017-08-26 DIAGNOSIS — Z96642 Presence of left artificial hip joint: Secondary | ICD-10-CM

## 2017-09-01 ENCOUNTER — Ambulatory Visit (INDEPENDENT_AMBULATORY_CARE_PROVIDER_SITE_OTHER): Payer: Medicare Other | Admitting: Orthopaedic Surgery

## 2017-10-10 ENCOUNTER — Ambulatory Visit (INDEPENDENT_AMBULATORY_CARE_PROVIDER_SITE_OTHER): Payer: Medicare Other | Admitting: Physician Assistant

## 2017-10-14 ENCOUNTER — Other Ambulatory Visit (INDEPENDENT_AMBULATORY_CARE_PROVIDER_SITE_OTHER): Payer: Self-pay | Admitting: Orthopaedic Surgery

## 2017-11-16 ENCOUNTER — Encounter (INDEPENDENT_AMBULATORY_CARE_PROVIDER_SITE_OTHER): Payer: Self-pay | Admitting: Orthopaedic Surgery

## 2017-11-16 ENCOUNTER — Ambulatory Visit (INDEPENDENT_AMBULATORY_CARE_PROVIDER_SITE_OTHER): Payer: Medicare Other | Admitting: Orthopaedic Surgery

## 2017-11-16 ENCOUNTER — Other Ambulatory Visit (INDEPENDENT_AMBULATORY_CARE_PROVIDER_SITE_OTHER): Payer: Self-pay

## 2017-11-16 DIAGNOSIS — M79605 Pain in left leg: Secondary | ICD-10-CM

## 2017-11-16 DIAGNOSIS — M25552 Pain in left hip: Secondary | ICD-10-CM

## 2017-11-16 MED ORDER — DOXYCYCLINE HYCLATE 100 MG PO TABS: 100.0000 mg | ORAL_TABLET | Freq: Two times a day (BID) | ORAL | 0 refills | Status: DC

## 2017-11-16 MED ORDER — METHYLPREDNISOLONE 4 MG PO TABS: ORAL_TABLET | ORAL | 0 refills | Status: DC

## 2017-11-16 NOTE — Progress Notes (Signed)
The patient comes in today to go over an MRI of her left hip area.  She still has a lot of pain all around the proximal thigh and hip in general.  She does stretching in the morning and it hurts her quite a bit and she says debilitating at this point in terms of definitely affecting her active daily living and her quality of life.  On exam I can easily move her left hip to internal extra rotation with minimal discomfort and pain.  Her pain seems to be around the proximal thigh area and hip and gluteus area as well.  The MRI is reviewed with her and shows only minimal arthritic changes in the hip itself.  There is no fluid collections around the gluteal area or the trochanteric area.  There is some mild tendinosis of the hip abductor muscles.  There is nonspecific inguinal node swelling but this is only mild.  They can see the lower aspect lumbar spine on the MRI and does show a herniated disc at L3-L4 with possible nerve impingement.  I would like to send her physical therapy to see if they can work on stretching her hip and other modalities around the hip itself in the proximal thigh muscles we can decrease her pain such as even considering a TENS unit.  They can certainly work on her spine as well.  I will send in a 6-day steroid taper and just due to the swollen lymph nodes we will try at least a short course of doxycycline.  Again the skin does not appear redness area and there is no evidence of cellulitis.  I might loss from a orthopedic standpoint with else I can offer.  She is to take the MRI report her primary care physician as well.  I do feel that outpatient physical therapy is worth trying.  Certainly if this ends up being more of lumbar spine issue due to what seen at the L3-L4 level we can always have her set up for an epidural steroid injection in the left side at L3-4 level.  We will see her back in 6 weeks to see how she is doing overall.

## 2017-11-28 ENCOUNTER — Encounter: Payer: Self-pay | Admitting: Physical Therapy

## 2017-11-28 ENCOUNTER — Other Ambulatory Visit: Payer: Self-pay

## 2017-11-28 ENCOUNTER — Ambulatory Visit: Payer: Medicare Other | Attending: Orthopaedic Surgery | Admitting: Physical Therapy

## 2017-11-28 DIAGNOSIS — M62838 Other muscle spasm: Secondary | ICD-10-CM | POA: Diagnosis present

## 2017-11-28 DIAGNOSIS — M25552 Pain in left hip: Secondary | ICD-10-CM | POA: Insufficient documentation

## 2017-11-28 DIAGNOSIS — M6281 Muscle weakness (generalized): Secondary | ICD-10-CM | POA: Diagnosis present

## 2017-11-28 NOTE — Patient Instructions (Signed)
Access Code: [email or web address]  URL: [email or web address]  Date: 11/28/2017  Prepared by: Elly Modena   Exercises  Supine Bridge - 10 reps - 3 sets - 2x daily - 7x weekly  Sidelying Quadriceps Stretch - 3 reps - 20 hold - 2x daily - 7x weekly    [facility] [address] [place],  [address] Phone # [phone] Fax [phone]

## 2017-11-28 NOTE — Therapy (Signed)
[facility] [address] [place], [place], [address] Phone: [phone]   Fax:  [phone]  Physical Therapy Evaluation  Patient Details  Name: Sierra Krueger MRN: [id number] Date of Birth: 02/07/1956 Referring Provider: Jean Rosenthal, MD    Encounter Date: 11/28/2017  PT End of Session - 11/28/17 1322    Visit Number  1    Date for PT Re-Evaluation  01/28/18    Authorization Type  UHC Medicare     Authorization Time Period  11/28/17 to 01/28/18    Authorization - Visit Number  1    Authorization - Number of Visits  10    PT Start Time  6962    PT Stop Time  9528    PT Time Calculation (min)  43 min    Activity Tolerance  Patient tolerated treatment well;No increased pain    Behavior During Therapy  WFL for tasks assessed/performed       Past Medical History:  Diagnosis Date  . Anxiety   . Left-sided weakness   . Stroke [facility]) 2007    Past Surgical History:  Procedure Laterality Date  . BACK SURGERY  2002   L3,4,5   . BREAST EXCISIONAL BIOPSY Left   . BREAST LUMPECTOMY Left 1997   benign tumor   . FOOT SURGERY Left 2010    There were no vitals filed for this visit.   Subjective Assessment - 11/28/17 1236    Subjective  Pt reports that she has had Lt hip pain for years since her stroke in 2007. She had a bursectomy approximately 2 years ago, but recently her Lt hip is still bothering her. She does not need surgery and was encouraged to come to PT to improve pain, strength and activity.     Pertinent History  Lt trochanteric bursectomy in 2017; stroke with Lt sided weakness 2007    Limitations  Sitting    How long can you sit comfortably?  unsure     Currently in Pain?  Yes    Pain Score  8     Pain Location  Hip    Pain Orientation  Anterior;Left    Pain Descriptors / Indicators  Aching;Throbbing    Pain Type  Chronic pain    Pain Radiating Towards  none     Pain Onset  More than a month ago    Pain Frequency  Constant    Aggravating Factors   sitting, reaching into the oven will bother it     Pain Relieving Factors  unsure    Effect of Pain on Daily Activities  difficulty with prolonged activity; limited walking     Multiple Pain Sites  No         [facility] PT Assessment - 11/28/17 0001      Assessment   Medical Diagnosis  pain in Lt leg     Referring Provider  Jean Rosenthal, MD     Next MD Visit  Sept 3, 2019      Precautions   Precautions  None      Restrictions   Weight Bearing Restrictions  No      Balance Screen   Has the patient fallen in the past 6 months  No    Has the patient had a decrease in activity level because of a fear of falling?   No    Is the patient reluctant to leave their home because of a fear of falling?   No  Home Environment   Living Environment  Private residence    Additional Comments  bedroom is upstairs       Prior Function   Level of Independence  Independent      Cognition   Overall Cognitive Status  Within Functional Limits for tasks assessed      Observation/Other Assessments   Focus on Therapeutic Outcomes (FOTO)   39% limited       Sensation   Additional Comments  denies numbness and tingling       ROM / Strength   AROM / PROM / Strength  AROM;Strength      AROM   Overall AROM Comments  standing active lumbar flexion x10 reps (no change in pain), repeated extension in standing x10 reps       Strength   Strength Assessment Site  Hip;Knee;Ankle    Right/Left Hip  Right;Left    Right Hip Flexion  4/5    Right Hip Extension  3/5    Right Hip External Rotation   4/5    Right Hip Internal Rotation  4/5    Right Hip ABduction  3/5 pain with resistance     Left Hip Flexion  3/5    Left Hip Extension  3/5    Left Hip External Rotation  3/5    Left Hip Internal Rotation  3/5    Left Hip ABduction  3/5    Right/Left Knee  Right;Left    Right Knee Flexion  4/5    Right Knee Extension  5/5    Left Knee Flexion   4/5    Left Knee Extension  5/5    Right/Left Ankle  Right;Left    Right Ankle Dorsiflexion  5/5    Left Ankle Dorsiflexion  5/5      Flexibility   Soft Tissue Assessment /Muscle Length  yes    Quadriceps  Lt (+) pain and 100 deg       Palpation   Palpation comment  tenderness along glutes, Lt quadriceps       Special Tests   Other special tests  (+) slump on Lt but (-) after second attempt due to pt difficulty describing pain/symptoms       Transfers   Five time sit to stand comments   13 sec, no UE support                 Objective measurements completed on examination: See above findings.      [facility] Adult PT Treatment/Exercise - 11/28/17 0001      Exercises   Exercises  Knee/Hip      Knee/Hip Exercises: Stretches   Quad Stretch  Left;1 rep;20 seconds    Quad Stretch Limitations  sidelying       Knee/Hip Exercises: Supine   Bridges  1 set;Both;5 reps    Other Supine Knee/Hip Exercises  clam LLE only with green TB x5 reps              PT Education - 11/28/17 1321    Education Details  eval findings/POC; implemented and reviewed HEP    Person(s) Educated  Patient    Methods  Explanation;Handout    Comprehension  Verbalized understanding;Returned demonstration       PT Short Term Goals - 11/28/17 1330      PT SHORT TERM GOAL #1   Title  Pt will demo consistency and independence with her initial HEP to improve strength and flexibility.     Time  4    Period  Weeks    Status  New    Target Date  12/29/17      PT SHORT TERM GOAL #2   Title  Pt will demo improved Lt quadriceps flexibility, to atleast 120 deg which will decrease strain on the anterior hip with daily activity.     Time  4    Period  Weeks    Status  New      PT SHORT TERM GOAL #3   Title  Pt will report atleast 30% improvement in her pain from the start of PT.    Time  4    Period  Weeks    Status  New        PT Long Term Goals - 11/28/17 1332      PT LONG TERM GOAL #1    Title  Pt will demo atleast 4/5 MMT with strength testing of the LLE to improve safety with functional activity.    Time  8    Period  Weeks    Status  New    Target Date  01/28/18      PT LONG TERM GOAL #2   Title  Pt will demo improved wellness evident by her report of walking atleast 20 min, 3x/week without difficulty.     Time  8    Period  Weeks    Status  New      PT LONG TERM GOAL #3   Title  Pt will report atleast 60% improvement in her pain from the start of PT which will allow her to go out with her senior group without limitation.     Time  8    Period  Weeks    Status  New      PT LONG TERM GOAL #4   Title  Pt will complete 5x sit to stand in less than 11 sec without UE support, to reflect improvements in LE strength and power.     Time  8    Status  New             Plan - 11/28/17 1324    Clinical Impression Statement  Pt is a pleasant 62 y.o F referred to OPPT with complaints of Lt hip/thigh pain for several years. She has tenderness along the Lt gluteals and quadriceps, as well as weakness of the hip abductors, rotators and extensors on the Lt. Pt had no symptom response to repeated lumbar movements and her response to neural tension testing made it difficult to clearly determine if this is contributing to her symptoms at this time. She would benefit from skilled PT to address her limitations in ROM, improve hip strength and flexibility as well as facilitate her independence with an advanced HEP to maintain her wellness and functional independence.    History and Personal Factors relevant to plan of care:  Lt trochanteric bursectomy 2017, stroke 2007    Clinical Presentation  Stable    Clinical Decision Making  Low    Rehab Potential  Good    PT Frequency  2x / week    PT Duration  8 weeks    PT Treatment/Interventions  ADLs/Self Care Home Management;Moist Heat;Electrical Stimulation;Cryotherapy;Therapeutic exercise;Therapeutic activities;Functional mobility  training;Neuromuscular re-education;Balance training;Patient/family education;Manual techniques;Passive range of motion;Dry needling    PT Next Visit Plan  STM Lt quadriceps, gluteals; progress hip strength (all directions)    PT Home Exercise Plan  [email or web address]    Consulted and Agree with  Plan of Care  Patient       Patient will benefit from skilled therapeutic intervention in order to improve the following deficits and impairments:  Decreased activity tolerance, Decreased balance, Difficulty walking, Hypomobility, Increased muscle spasms, Obesity, Improper body mechanics, Decreased range of motion, Impaired flexibility, Postural dysfunction, Pain, Decreased strength  Visit Diagnosis: Pain in left hip - Plan: PT plan of care cert/re-cert  Muscle weakness (generalized) - Plan: PT plan of care cert/re-cert  Other muscle spasm - Plan: PT plan of care cert/re-cert     Problem List Patient Active Problem List   Diagnosis Date Noted  . Pain in left hip 08/11/2017  . Trochanteric bursitis, left hip 05/18/2016    1:39 PM,11/28/17 Sherol Dade PT, DPT [place] [facility] [address] [place], [place], [address] Phone: [phone]   Fax:  [phone]  Name: Sierra Krueger MRN: [id number] Date of Birth: June 19, 1955

## 2017-11-30 ENCOUNTER — Ambulatory Visit: Payer: Medicare Other | Admitting: Physical Therapy

## 2017-11-30 DIAGNOSIS — M25552 Pain in left hip: Secondary | ICD-10-CM | POA: Diagnosis not present

## 2017-11-30 DIAGNOSIS — M62838 Other muscle spasm: Secondary | ICD-10-CM

## 2017-11-30 DIAGNOSIS — M6281 Muscle weakness (generalized): Secondary | ICD-10-CM

## 2017-11-30 NOTE — Therapy (Signed)
[facility] [address], [place], [place], [address] Phone: [phone]   Fax:  [phone]  Physical Therapy Treatment  Patient Details  Name: Sierra Krueger MRN: [id number] Date of Birth: 07-Nov-1955 Referring Provider: Jean Rosenthal, MD    Encounter Date: 11/30/2017  PT End of Session - 11/30/17 1451    Visit Number  2    Date for PT Re-Evaluation  01/28/18    Authorization Type  UHC Medicare     Authorization Time Period  11/28/17 to 01/28/18    Authorization - Visit Number  2    Authorization - Number of Visits  10    PT Start Time  1401    PT Stop Time  6962    PT Time Calculation (min)  44 min    Activity Tolerance  Patient tolerated treatment well;No increased pain    Behavior During Therapy  WFL for tasks assessed/performed       Past Medical History:  Diagnosis Date  . Anxiety   . Left-sided weakness   . Stroke [facility]) 2007    Past Surgical History:  Procedure Laterality Date  . BACK SURGERY  2002   L3,4,5   . BREAST EXCISIONAL BIOPSY Left   . BREAST LUMPECTOMY Left 1997   benign tumor   . FOOT SURGERY Left 2010    There were no vitals filed for this visit.  Subjective Assessment - 11/30/17 1405    Subjective  Pt reports that things are going well. She is a little sore after sitting on a hard chair outside before the session.     Pertinent History  Lt trochanteric bursectomy in 2017; stroke with Lt sided weakness 2007    Limitations  Sitting    How long can you sit comfortably?  unsure     Currently in Pain?  Yes    Pain Score  6     Pain Location  Hip    Pain Orientation  Left;Anterior    Pain Descriptors / Indicators  Aching;Throbbing    Pain Type  Chronic pain    Pain Radiating Towards  none    Pain Onset  More than a month ago    Pain Frequency  Constant                       [facility] Adult PT Treatment/Exercise - 11/30/17 0001      Self-Care   Self-Care   RICE;Posture    RICE  ice parameters at home     Posture  sleeping with pillow between knees       Knee/Hip Exercises: Stretches   Other Knee/Hip Stretches  adductor butterfly stretch 10x10 sec     Other Knee/Hip Stretches  Lt and Rt piriformis stretch 3x30 sec each      Knee/Hip Exercises: Seated   Ball Squeeze  3 sec x10 reps       Knee/Hip Exercises: Supine   Bridges  Both;1 set;10 reps    Other Supine Knee/Hip Exercises  Lt hip extension isometric hold 2 sec x15 reps     Other Supine Knee/Hip Exercises  hooklying hip flexion isometric hold 3sec x10 reps each LE      Modalities   Modalities  Cryotherapy      Cryotherapy   Number Minutes Cryotherapy  8 Minutes    Cryotherapy Location  Hip Lt     Type of Cryotherapy  Ice pack      Manual Therapy  Manual Therapy  Soft tissue mobilization    Soft tissue mobilization  Rolling stick Lt lateral quad and ITB, STM Glute max and med             PT Education - 11/30/17 1450    Education Details  technique with therex; ice application; sleeping position    Person(s) Educated  Patient    Methods  Explanation;Verbal cues    Comprehension  Verbalized understanding;Returned demonstration       PT Short Term Goals - 11/28/17 1330      PT SHORT TERM GOAL #1   Title  Pt will demo consistency and independence with her initial HEP to improve strength and flexibility.     Time  4    Period  Weeks    Status  New    Target Date  12/29/17      PT SHORT TERM GOAL #2   Title  Pt will demo improved Lt quadriceps flexibility, to atleast 120 deg which will decrease strain on the anterior hip with daily activity.     Time  4    Period  Weeks    Status  New      PT SHORT TERM GOAL #3   Title  Pt will report atleast 30% improvement in her pain from the start of PT.    Time  4    Period  Weeks    Status  New        PT Long Term Goals - 11/28/17 1332      PT LONG TERM GOAL #1   Title  Pt will demo atleast 4/5 MMT with strength  testing of the LLE to improve safety with functional activity.    Time  8    Period  Weeks    Status  New    Target Date  01/28/18      PT LONG TERM GOAL #2   Title  Pt will demo improved wellness evident by her report of walking atleast 20 min, 3x/week without difficulty.     Time  8    Period  Weeks    Status  New      PT LONG TERM GOAL #3   Title  Pt will report atleast 60% improvement in her pain from the start of PT which will allow her to go out with her senior group without limitation.     Time  8    Period  Weeks    Status  New      PT LONG TERM GOAL #4   Title  Pt will complete 5x sit to stand in less than 11 sec without UE support, to reflect improvements in LE strength and power.     Time  8    Status  New            Plan - 11/30/17 1451    Clinical Impression Statement  Pt arrived for her first treatment since her evaluation 2 days ago. Therapist completed soft tissue mobilization along the Lt lateral and anterior hip. Pt does have scar adhesions from her previous surgery which are causing pain with palpation to the area. Also progressed gentle hip strength and flexibility, with pt requiring intermittent cuing. Ended session without increase in pain. Will continue with current POC.     Rehab Potential  Good    PT Frequency  2x / week    PT Duration  8 weeks    PT Treatment/Interventions  ADLs/Self Care Home Management;Moist  Heat;Electrical Stimulation;Cryotherapy;Therapeutic exercise;Therapeutic activities;Functional mobility training;Neuromuscular re-education;Balance training;Patient/family education;Manual techniques;Passive range of motion;Dry needling    PT Next Visit Plan  STM Lt quadriceps, gluteals; progress hip strength (all directions)    PT Home Exercise Plan  [email or web address]    Consulted and Agree with Plan of Care  Patient       Patient will benefit from skilled therapeutic intervention in order to improve the following deficits and impairments:  Decreased  activity tolerance, Decreased balance, Difficulty walking, Hypomobility, Increased muscle spasms, Obesity, Improper body mechanics, Decreased range of motion, Impaired flexibility, Postural dysfunction, Pain, Decreased strength  Visit Diagnosis: Pain in left hip  Muscle weakness (generalized)  Other muscle spasm     Problem List Patient Active Problem List   Diagnosis Date Noted  . Pain in left hip 08/11/2017  . Trochanteric bursitis, left hip 05/18/2016    2:58 PM,11/30/17 Sherol Dade PT, DPT [place] [facility] [address] [place], [place], [address] Phone: [phone]   Fax:  [phone]  Name: Sierra Krueger MRN: [id number] Date of Birth: [birth year]

## 2017-12-05 ENCOUNTER — Encounter: Payer: Medicare Other | Admitting: Physical Therapy

## 2017-12-07 ENCOUNTER — Ambulatory Visit: Payer: Medicare Other | Admitting: Physical Therapy

## 2017-12-07 DIAGNOSIS — M25552 Pain in left hip: Secondary | ICD-10-CM | POA: Diagnosis not present

## 2017-12-07 DIAGNOSIS — M62838 Other muscle spasm: Secondary | ICD-10-CM

## 2017-12-07 DIAGNOSIS — M6281 Muscle weakness (generalized): Secondary | ICD-10-CM

## 2017-12-07 NOTE — Patient Instructions (Signed)
Access Code: [email or web address]  URL: [email or web address]  Date: 12/07/2017  Prepared by: Elly Modena   Exercises  Supine Bridge - 10 reps - 3 sets - 2x daily - 7x weekly  Sidelying Quadriceps Stretch - 3 reps - 20 hold - 2x daily - 7x weekly  Clamshell with Resistance - 10 reps - 2 sets - 2x daily - 7x weekly    [facility] [address], [place], [place] [address] Phone # [phone] Fax [phone]

## 2017-12-07 NOTE — Therapy (Signed)
[facility] [address], [place], [place], [address] Phone: [phone]   Fax:  [phone]  Physical Therapy Treatment  Patient Details  Name: Sierra Krueger MRN: [id number] Date of Birth: 22-Nov-1955 Referring Provider: Jean Rosenthal, MD    Encounter Date: 12/07/2017  PT End of Session - 12/07/17 1224    Visit Number  3    Date for PT Re-Evaluation  01/28/18    Authorization Type  UHC Medicare     Authorization Time Period  11/28/17 to 01/28/18    Authorization - Visit Number  3    Authorization - Number of Visits  10    PT Start Time  9628    PT Stop Time  3662    PT Time Calculation (min)  39 min    Activity Tolerance  Patient tolerated treatment well;No increased pain    Behavior During Therapy  WFL for tasks assessed/performed       Past Medical History:  Diagnosis Date  . Anxiety   . Left-sided weakness   . Stroke [facility]) 2007    Past Surgical History:  Procedure Laterality Date  . BACK SURGERY  2002   L3,4,5   . BREAST EXCISIONAL BIOPSY Left   . BREAST LUMPECTOMY Left 1997   benign tumor   . FOOT SURGERY Left 2010    There were no vitals filed for this visit.  Subjective Assessment - 12/07/17 1149    Subjective  Pt states that her Lt hip is sore today. She saw her NP who thinks she should go see her oncologist due to swollen lymphnodes in her Lt groin area.     Pertinent History  Lt trochanteric bursectomy in 2017; stroke with Lt sided weakness 2007    Limitations  Sitting    How long can you sit comfortably?  unsure     Currently in Pain?  Yes    Pain Score  6     Pain Location  Hip    Pain Orientation  Left    Pain Descriptors / Indicators  Aching;Throbbing    Pain Type  Chronic pain    Pain Radiating Towards  none     Pain Onset  More than a month ago    Pain Frequency  Constant    Aggravating Factors   alot of activity.     Pain Relieving Factors  ice                        [facility] Adult PT Treatment/Exercise - 12/07/17 0001      Exercises   Exercises  Knee/Hip      Knee/Hip Exercises: Stretches   Hip Flexor Stretch  3 reps;20 seconds;Both    Hip Flexor Stretch Limitations  lunge on steps     Other Knee/Hip Stretches  Lt and Rt standing hip adductor stretch 2x10 sec with close supervision       Knee/Hip Exercises: Standing   Other Standing Knee Exercises  3 way hip slider on Lt x10 reps       Knee/Hip Exercises: Seated   Ball Squeeze  10x5 sec hold     Sit to Sand  2 sets;10 reps;without UE support   2nd set on foam pad      Knee/Hip Exercises: Supine   Bridges  Both;2 sets;10 reps    Straight Leg Raises  2 sets;10 reps;Both    Other Supine Knee/Hip Exercises  Rt clam with red TB  2x10 reps       Knee/Hip Exercises: Sidelying   Clams  Lt 2x10 reps with red TB             PT Education - 12/07/17 1224    Education Details  technique with therex; updated HEP    Person(s) Educated  Patient    Methods  Explanation;Verbal cues;Tactile cues    Comprehension  Verbalized understanding;Returned demonstration       PT Short Term Goals - 12/07/17 1227      PT SHORT TERM GOAL #1   Title  Pt will demo consistency and independence with her initial HEP to improve strength and flexibility.     Time  4    Period  Weeks    Status  Achieved      PT SHORT TERM GOAL #2   Title  Pt will demo improved Lt quadriceps flexibility, to atleast 120 deg which will decrease strain on the anterior hip with daily activity.     Time  4    Period  Weeks    Status  On-going      PT SHORT TERM GOAL #3   Title  Pt will report atleast 30% improvement in her pain from the start of PT.    Time  4    Period  Weeks    Status  On-going        PT Long Term Goals - 11/28/17 1332      PT LONG TERM GOAL #1   Title  Pt will demo atleast 4/5 MMT with strength testing of the LLE to improve safety with functional activity.    Time  8     Period  Weeks    Status  New    Target Date  01/28/18      PT LONG TERM GOAL #2   Title  Pt will demo improved wellness evident by her report of walking atleast 20 min, 3x/week without difficulty.     Time  8    Period  Weeks    Status  New      PT LONG TERM GOAL #3   Title  Pt will report atleast 60% improvement in her pain from the start of PT which will allow her to go out with her senior group without limitation.     Time  8    Period  Weeks    Status  New      PT LONG TERM GOAL #4   Title  Pt will complete 5x sit to stand in less than 11 sec without UE support, to reflect improvements in LE strength and power.     Time  8    Status  New            Plan - 12/07/17 1224    Clinical Impression Statement  Pt has been consistently completing her HEP since last session. She did note increase in Lt hip pain following a busy weekend, but she was able to demonstrate good use of ice modality to manage her pain. Therapist instructed pt in several new exercises to promote hip strength, and pt was able to complete without increase in hip pain. Clamshells were modified to avoid laying on Lt hip. No increase in pain was reported end of session.     Rehab Potential  Good    PT Frequency  2x / week    PT Duration  8 weeks    PT Treatment/Interventions  ADLs/Self Care Home Management;Moist  Heat;Electrical Stimulation;Cryotherapy;Therapeutic exercise;Therapeutic activities;Functional mobility training;Neuromuscular re-education;Balance training;Patient/family education;Manual techniques;Passive range of motion;Dry needling    PT Next Visit Plan  STM Lt quadriceps, gluteals; progress hip strength (all directions)    PT Home Exercise Plan  [email or web address]    Consulted and Agree with Plan of Care  Patient       Patient will benefit from skilled therapeutic intervention in order to improve the following deficits and impairments:  Decreased activity tolerance, Decreased balance, Difficulty walking,  Hypomobility, Increased muscle spasms, Obesity, Improper body mechanics, Decreased range of motion, Impaired flexibility, Postural dysfunction, Pain, Decreased strength  Visit Diagnosis: Pain in left hip  Muscle weakness (generalized)  Other muscle spasm     Problem List Patient Active Problem List   Diagnosis Date Noted  . Pain in left hip 08/11/2017  . Trochanteric bursitis, left hip 05/18/2016   12:29 PM,12/07/17 Sherol Dade PT, DPT [place] [facility] [place] [facility] [address], [place], [place], [address] Phone: [phone]   Fax:  [phone]  Name: Carinna Newhart MRN: [id number] Date of Birth: 1955-09-10

## 2017-12-08 ENCOUNTER — Other Ambulatory Visit (INDEPENDENT_AMBULATORY_CARE_PROVIDER_SITE_OTHER): Payer: Self-pay | Admitting: Orthopaedic Surgery

## 2017-12-15 ENCOUNTER — Encounter: Payer: Medicare Other | Admitting: Physical Therapy

## 2017-12-19 ENCOUNTER — Ambulatory Visit: Payer: Medicare Other | Admitting: Physical Therapy

## 2017-12-19 ENCOUNTER — Encounter: Payer: Self-pay | Admitting: Physical Therapy

## 2017-12-19 DIAGNOSIS — M6281 Muscle weakness (generalized): Secondary | ICD-10-CM

## 2017-12-19 DIAGNOSIS — M62838 Other muscle spasm: Secondary | ICD-10-CM

## 2017-12-19 DIAGNOSIS — M25552 Pain in left hip: Secondary | ICD-10-CM

## 2017-12-19 NOTE — Therapy (Signed)
[facility] [address], [place], [place], [address] Phone: [phone]   Fax:  [phone]  Physical Therapy Treatment  Patient Details  Name: Sierra Krueger MRN: [id number] Date of Birth: 1956-03-29 Referring Provider: Jean Rosenthal, MD    Encounter Date: 12/19/2017  PT End of Session - 12/19/17 1120    Visit Number  4    Date for PT Re-Evaluation  01/28/18    Authorization Type  [employer] Medicare     Authorization Time Period  11/28/17 to 01/28/18    Authorization - Visit Number  4    Authorization - Number of Visits  10    PT Start Time  1100    PT Stop Time  5053    PT Time Calculation (min)  44 min    Activity Tolerance  Patient tolerated treatment well;No increased pain    Behavior During Therapy  WFL for tasks assessed/performed       Past Medical History:  Diagnosis Date  . Anxiety   . Left-sided weakness   . Stroke [facility]) 2007    Past Surgical History:  Procedure Laterality Date  . BACK SURGERY  2002   L3,4,5   . BREAST EXCISIONAL BIOPSY Left   . BREAST LUMPECTOMY Left 1997   benign tumor   . FOOT SURGERY Left 2010    There were no vitals filed for this visit.  Subjective Assessment - 12/19/17 1103    Subjective  Pt reports that she had high blood pressure over the last week, but is doing better now. She states her hip is feeling good right now just with her normal morning routine. She will try some of her exercises at home but not the one with the stairs.     Pertinent History  Lt trochanteric bursectomy in 2017; stroke with Lt sided weakness 2007    Limitations  Sitting    How long can you sit comfortably?  unsure     Currently in Pain?  No/denies    Pain Onset  More than a month ago                       [facility] Adult PT Treatment/Exercise - 12/19/17 0001      Exercises   Exercises  Knee/Hip      Knee/Hip Exercises: Stretches   Quad Stretch  Left;3 reps;30 seconds    Quad Stretch Limitations  2 prone and 1 sidelying     Other Knee/Hip Stretches  Lt glute stretch 3x15 sec       Knee/Hip Exercises: Aerobic   Stationary Bike  L2 x5 min, PT present to discuss progress       Knee/Hip Exercises: Standing   Other Standing Knee Exercises  3 way hip slider Lt and Rt x5 reps each, x5 reps yellow TB       Knee/Hip Exercises: Seated   Other Seated Knee/Hip Exercises  Lt and Rt hip ER with yellow TB 2x15 reps each, graded resistance for 2nd set       Knee/Hip Exercises: Supine   Straight Leg Raise with External Rotation  Both;2 sets;10 reps      Knee/Hip Exercises: Prone   Hip Extension  Left;2 sets;10 reps    Hip Extension Limitations  1x10 reps on Rt              PT Education - 12/19/17 1118    Education Details  technique with therex    Person(s)  Educated  Patient    Methods  Explanation;Verbal cues;Tactile cues    Comprehension  Verbalized understanding;Returned demonstration       PT Short Term Goals - 12/19/17 1106      PT SHORT TERM GOAL #1   Title  Pt will demo consistency and independence with her initial HEP to improve strength and flexibility.     Time  4    Period  Weeks    Status  Achieved      PT SHORT TERM GOAL #2   Title  Pt will demo improved Lt quadriceps flexibility, to atleast 120 deg which will decrease strain on the anterior hip with daily activity.     Time  4    Period  Weeks    Status  On-going      PT SHORT TERM GOAL #3   Title  Pt will report atleast 30% improvement in her pain from the start of PT.    Baseline  50% improved    Time  4    Period  Weeks    Status  Achieved        PT Long Term Goals - 11/28/17 1332      PT LONG TERM GOAL #1   Title  Pt will demo atleast 4/5 MMT with strength testing of the LLE to improve safety with functional activity.    Time  8    Period  Weeks    Status  New    Target Date  01/28/18      PT LONG TERM GOAL #2   Title  Pt will demo improved wellness evident by her  report of walking atleast 20 min, 3x/week without difficulty.     Time  8    Period  Weeks    Status  New      PT LONG TERM GOAL #3   Title  Pt will report atleast 60% improvement in her pain from the start of PT which will allow her to go out with her senior group without limitation.     Time  8    Period  Weeks    Status  New      PT LONG TERM GOAL #4   Title  Pt will complete 5x sit to stand in less than 11 sec without UE support, to reflect improvements in LE strength and power.     Time  8    Status  New            Plan - 12/19/17 1120    Clinical Impression Statement  Pt is making steady progress towards her goals, noting that she feels 50% improved overall. She was able to complete her cooking yesterday without needing to sit down. Session continued with therex to promote hip strength, pt has increased difficulty with standing therex due to gluteal weakness, and therapist had to make some cues to improve alignment. Progressed LE strengthening with noted LLE fatigue, but no increase in pain was reported. Will continue with current POC to improve LE strength and flexibility.     Rehab Potential  Good    PT Frequency  2x / week    PT Duration  8 weeks    PT Treatment/Interventions  ADLs/Self Care Home Management;Moist Heat;Electrical Stimulation;Cryotherapy;Therapeutic exercise;Therapeutic activities;Functional mobility training;Neuromuscular re-education;Balance training;Patient/family education;Manual techniques;Passive range of motion;Dry needling    PT Next Visit Plan  STM Lt quadriceps, gluteals; progress hip strength (all directions)    PT Home Exercise Plan  [facility] [place] [facility]  Consulted and Agree with Plan of Care  Patient       Patient will benefit from skilled therapeutic intervention in order to improve the following deficits and impairments:  Decreased activity tolerance, Decreased balance, Difficulty walking, Hypomobility, Increased muscle spasms, Obesity, Improper body  mechanics, Decreased range of motion, Impaired flexibility, Postural dysfunction, Pain, Decreased strength  Visit Diagnosis: Pain in left hip  Muscle weakness (generalized)  Other muscle spasm     Problem List Patient Active Problem List   Diagnosis Date Noted  . Pain in left hip 08/11/2017  . Trochanteric bursitis, left hip 05/18/2016     11:41 AM,12/19/17 Sherol Dade PT, DPT [facility] [address], [place], [place], [address] Phone: [phone]   Fax:  [phone]  Name: Sierra Krueger MRN: [id number] Date of Birth: [birth year]

## 2017-12-21 ENCOUNTER — Encounter: Payer: Self-pay | Admitting: Physical Therapy

## 2017-12-21 ENCOUNTER — Ambulatory Visit: Payer: Medicare Other | Admitting: Physical Therapy

## 2017-12-21 DIAGNOSIS — M25552 Pain in left hip: Secondary | ICD-10-CM

## 2017-12-21 DIAGNOSIS — M62838 Other muscle spasm: Secondary | ICD-10-CM

## 2017-12-21 DIAGNOSIS — M6281 Muscle weakness (generalized): Secondary | ICD-10-CM

## 2017-12-21 NOTE — Therapy (Addendum)
[facility] [address], [place], [place], [address] Phone: [phone]   Fax:  [phone]  Physical Therapy Treatment/Discharge  Patient Details  Name: Sierra Krueger MRN: [id number] Date of Birth: Nov 17, 1955 Referring Provider: Jean Rosenthal, MD    Encounter Date: 12/21/2017  PT End of Session - 12/21/17 1105    Visit Number  5    Date for PT Re-Evaluation  01/28/18    Authorization Type  [employer] Medicare     Authorization Time Period  11/28/17 to 01/28/18    Authorization - Visit Number  5    Authorization - Number of Visits  10    PT Start Time  1050    PT Stop Time  1130    PT Time Calculation (min)  40 min    Activity Tolerance  Patient tolerated treatment well;No increased pain    Behavior During Therapy  WFL for tasks assessed/performed       Past Medical History:  Diagnosis Date  . Anxiety   . Left-sided weakness   . Stroke [facility]) 2007    Past Surgical History:  Procedure Laterality Date  . BACK SURGERY  2002   L3,4,5   . BREAST EXCISIONAL BIOPSY Left   . BREAST LUMPECTOMY Left 1997   benign tumor   . FOOT SURGERY Left 2010    There were no vitals filed for this visit.  Subjective Assessment - 12/21/17 1053    Subjective  Pt reports that she slept really good last night. She has no pain in her hip currently and thinks that her blood pressure is undercontrol.     Pertinent History  Lt trochanteric bursectomy in 2017; stroke with Lt sided weakness 2007    Limitations  Sitting    How long can you sit comfortably?  unsure     Currently in Pain?  No/denies    Pain Onset  More than a month ago                       [facility] Adult PT Treatment/Exercise - 12/21/17 0001      Knee/Hip Exercises: Stretches   Hip Flexor Stretch  3 reps;Both;30 seconds    Hip Flexor Stretch Limitations  on steps       Knee/Hip Exercises: Aerobic   Stationary Bike  x5 min, L2/L4 intervals for LE  endurance      Knee/Hip Exercises: Standing   Other Standing Knee Exercises  3 way hip slider with yellow TB around ankles x10 reps each LE      Manual Therapy   Manual Therapy  Myofascial release    Manual therapy comments  Lt glute max stretch 2x20 sec     Soft tissue mobilization  STM Lt quadriceps, Lt TFL     Myofascial Release  Lt quad trigger point release              PT Education - 12/21/17 1108    Education Details  technique with therex     Person(s) Educated  Patient    Methods  Explanation;Verbal cues;Handout       PT Short Term Goals - 12/19/17 1106      PT SHORT TERM GOAL #1   Title  Pt will demo consistency and independence with her initial HEP to improve strength and flexibility.     Time  4    Period  Weeks    Status  Achieved      PT  SHORT TERM GOAL #2   Title  Pt will demo improved Lt quadriceps flexibility, to atleast 120 deg which will decrease strain on the anterior hip with daily activity.     Time  4    Period  Weeks    Status  On-going      PT SHORT TERM GOAL #3   Title  Pt will report atleast 30% improvement in her pain from the start of PT.    Baseline  50% improved    Time  4    Period  Weeks    Status  Achieved        PT Long Term Goals - 11/28/17 1332      PT LONG TERM GOAL #1   Title  Pt will demo atleast 4/5 MMT with strength testing of the LLE to improve safety with functional activity.    Time  8    Period  Weeks    Status  New    Target Date  01/28/18      PT LONG TERM GOAL #2   Title  Pt will demo improved wellness evident by her report of walking atleast 20 min, 3x/week without difficulty.     Time  8    Period  Weeks    Status  New      PT LONG TERM GOAL #3   Title  Pt will report atleast 60% improvement in her pain from the start of PT which will allow her to go out with her senior group without limitation.     Time  8    Period  Weeks    Status  New      PT LONG TERM GOAL #4   Title  Pt will complete 5x  sit to stand in less than 11 sec without UE support, to reflect improvements in LE strength and power.     Time  8    Status  New            Plan - 12/21/17 1131    Clinical Impression Statement  Pt reports intermittent Lt quadriceps pain throughout the day. Continued with therex to improve hip strength and therapist completed manual treatment to the quadriceps and TFL. Pt did have palpable trigger points in the quadriceps and therapist noted improved tissue flexibility end of session. Pt's clamshell was progressed with a green TB for home and she verbalized understanding of this. Ended without reports of increased LLE pain.     Rehab Potential  Good    PT Frequency  2x / week    PT Duration  8 weeks    PT Treatment/Interventions  ADLs/Self Care Home Management;Moist Heat;Electrical Stimulation;Cryotherapy;Therapeutic exercise;Therapeutic activities;Functional mobility training;Neuromuscular re-education;Balance training;Patient/family education;Manual techniques;Passive range of motion;Dry needling    PT Next Visit Plan  STM Lt quadriceps, gluteals; progress hip strength (all directions)    PT Home Exercise Plan  [id number]    Consulted and Agree with Plan of Care  Patient       Patient will benefit from skilled therapeutic intervention in order to improve the following deficits and impairments:  Decreased activity tolerance, Decreased balance, Difficulty walking, Hypomobility, Increased muscle spasms, Obesity, Improper body mechanics, Decreased range of motion, Impaired flexibility, Postural dysfunction, Pain, Decreased strength  Visit Diagnosis: Pain in left hip  Muscle weakness (generalized)  Other muscle spasm     Problem List Patient Active Problem List   Diagnosis Date Noted  . Pain in left hip 08/11/2017  . Trochanteric bursitis,  left hip 05/18/2016     11:41 AM,12/21/17 Sherol Dade PT, DPT [facility] [address], [place], [place], [address] Phone: [phone]   Fax:  [phone]  Name: Sierra Krueger MRN: [id number] Date of Birth: 24-Feb-1956  *addendum to resolve episode of care and d/c pt from [place]  Visits from Start of Care: 5  Current functional level related to goals / functional outcomes: See above for more details    Remaining deficits: See above for more details    Education / Equipment: See above for more details  Plan: Patient agrees to discharge.  Patient goals were partially met. Patient is being discharged due to the physician's request.  ?????    9:15 AM,01/10/18 Sherol Dade PT, [place] [facility] [place]

## 2017-12-27 ENCOUNTER — Encounter (INDEPENDENT_AMBULATORY_CARE_PROVIDER_SITE_OTHER): Payer: Self-pay | Admitting: Orthopaedic Surgery

## 2017-12-27 ENCOUNTER — Ambulatory Visit (INDEPENDENT_AMBULATORY_CARE_PROVIDER_SITE_OTHER): Payer: Medicare Other | Admitting: Orthopaedic Surgery

## 2017-12-27 DIAGNOSIS — M79652 Pain in left thigh: Secondary | ICD-10-CM | POA: Diagnosis not present

## 2017-12-27 NOTE — Progress Notes (Signed)
HPI: Mrs. Sierra Krueger returns today for follow-up of her left thigh pain.  She states that the Medrol dose pack it helps some.  She is unable to see tolerate the Etodolac due to GI upset.  She is back on Mobic.  She seen her primary care physician who was ordered a CT scan that has not been set up as of yet to evaluate her left thigh.  She continues to have thigh swelling pain anterior and posterior aspect of the thigh.  No numbness or tingling down the leg no back pain.  She continues to work with physical therapy states that therapy helps the day that she is doing therapy the next day she is back to having thigh pain is located.  She does find Voltaren gel that she is rubbing on 5 to be beneficial.  She denies any fevers chills.  She is set up to start seeing pain management on 12/29/2017.  Review of systems please see HPI otherwise negative  Physical exam: General well-developed well-nourished female in no acute distress mood affect appropriate Left thigh: Anterior thigh global tenderness.  Posterior thigh proximal thigh is tender to palpation.  She has good range of motion of the left hip without significant pain.  Negative straight leg raise bilaterally.  She is able to touch her toes.  She has good extension of the lumbar spine without pain.  Nontender over the lower lumbar spinal column and paraspinous region.  Impression: Left thigh pain  Plan: We will see her back in 1 month to review the CT scan of her thigh also see how she is overall doing regards to her left thigh pain.  She will continue exercises as taught by therapy.  Continue Voltaren gel and Mobic.  Questions were encouraged and answered at length.

## 2017-12-29 ENCOUNTER — Encounter: Payer: Medicare Other | Admitting: Physical Therapy

## 2018-01-03 ENCOUNTER — Encounter: Payer: Medicare Other | Admitting: Physical Therapy

## 2018-01-05 ENCOUNTER — Encounter: Payer: Medicare Other | Admitting: Physical Therapy

## 2018-01-10 ENCOUNTER — Encounter: Payer: Medicare Other | Admitting: Physical Therapy

## 2018-01-12 ENCOUNTER — Encounter: Payer: Medicare Other | Admitting: Physical Therapy

## 2018-01-16 ENCOUNTER — Other Ambulatory Visit: Payer: Self-pay | Admitting: Internal Medicine

## 2018-01-16 DIAGNOSIS — R591 Generalized enlarged lymph nodes: Secondary | ICD-10-CM

## 2018-01-16 DIAGNOSIS — M5136 Other intervertebral disc degeneration, lumbar region: Secondary | ICD-10-CM

## 2018-01-17 ENCOUNTER — Encounter: Payer: Medicare Other | Admitting: Physical Therapy

## 2018-01-19 ENCOUNTER — Encounter: Payer: Medicare Other | Admitting: Physical Therapy

## 2018-01-20 ENCOUNTER — Ambulatory Visit
Admission: RE | Admit: 2018-01-20 | Discharge: 2018-01-20 | Disposition: A | Discharge status: Home / Self Care | Payer: Medicare Other | Source: Ambulatory Visit | Attending: Internal Medicine | Admitting: Internal Medicine

## 2018-01-20 DIAGNOSIS — R591 Generalized enlarged lymph nodes: Secondary | ICD-10-CM

## 2018-01-20 MED ORDER — IOPAMIDOL (ISOVUE-300) INJECTION 61%
100.0000 mL | Freq: Once | INTRAVENOUS | Status: AC | PRN
  Administered 2018-01-20: 75 mL via INTRAVENOUS

## 2018-01-21 ENCOUNTER — Ambulatory Visit
Admission: RE | Admit: 2018-01-21 | Discharge: 2018-01-21 | Disposition: A | Discharge status: Home / Self Care | Payer: Medicare Other | Source: Ambulatory Visit | Attending: Internal Medicine | Admitting: Internal Medicine

## 2018-01-21 DIAGNOSIS — M5136 Other intervertebral disc degeneration, lumbar region: Secondary | ICD-10-CM

## 2018-01-24 ENCOUNTER — Encounter: Payer: Medicare Other | Admitting: Physical Therapy

## 2018-01-26 ENCOUNTER — Ambulatory Visit (INDEPENDENT_AMBULATORY_CARE_PROVIDER_SITE_OTHER): Payer: Medicare Other | Admitting: Physician Assistant

## 2018-01-26 ENCOUNTER — Encounter: Payer: Medicare Other | Admitting: Physical Therapy

## 2018-02-07 ENCOUNTER — Other Ambulatory Visit (INDEPENDENT_AMBULATORY_CARE_PROVIDER_SITE_OTHER): Payer: Self-pay | Admitting: Orthopaedic Surgery

## 2018-03-20 ENCOUNTER — Encounter (INDEPENDENT_AMBULATORY_CARE_PROVIDER_SITE_OTHER): Payer: Self-pay | Admitting: Orthopaedic Surgery

## 2018-03-20 ENCOUNTER — Ambulatory Visit (INDEPENDENT_AMBULATORY_CARE_PROVIDER_SITE_OTHER): Payer: Medicare Other | Admitting: Orthopaedic Surgery

## 2018-03-20 ENCOUNTER — Other Ambulatory Visit (HOSPITAL_COMMUNITY): Payer: Self-pay | Admitting: Urology

## 2018-03-20 ENCOUNTER — Other Ambulatory Visit: Payer: Self-pay | Admitting: Urology

## 2018-03-20 DIAGNOSIS — M48061 Spinal stenosis, lumbar region without neurogenic claudication: Secondary | ICD-10-CM | POA: Diagnosis not present

## 2018-03-20 DIAGNOSIS — N200 Calculus of kidney: Secondary | ICD-10-CM

## 2018-03-20 DIAGNOSIS — M7062 Trochanteric bursitis, left hip: Secondary | ICD-10-CM | POA: Diagnosis not present

## 2018-03-20 MED ORDER — LIDOCAINE HCL 1 % IJ SOLN
3.0000 mL | INTRAMUSCULAR | Status: AC | PRN
  Administered 2018-03-20: 3 mL

## 2018-03-20 MED ORDER — METHYLPREDNISOLONE ACETATE 40 MG/ML IJ SUSP
40.0000 mg | INTRAMUSCULAR | Status: AC | PRN
  Administered 2018-03-20: 40 mg via INTRA_ARTICULAR

## 2018-03-20 NOTE — Progress Notes (Signed)
Office Visit Note   Patient: Sierra Krueger           Date of Birth: Jan 16, 1956           MRN: [id number] Visit Date: 03/20/2018              Requested by: Nolene Ebbs, MD [address] [place], [place] [address] PCP: Nolene Ebbs, MD   Assessment & Plan: Visit Diagnoses:  1. Trochanteric bursitis, left hip   2. Spinal stenosis of lumbar region without neurogenic claudication     Plan: I did provide a steroid injection of the trochanteric area per request today of her left hip.  I will send her to Dr. Louanne Skye to see if she is potentially a surgical candidate from the standpoint of her lumbar spine.  Follow-Up Instructions: No follow-ups on file.   Orders:  Orders Placed This Encounter  Procedures  . Large Joint Inj  . Large Joint Inj   No orders of the defined types were placed in this encounter.     Procedures: Large Joint Inj: L greater trochanter on 03/20/2018 9:30 AM Indications: pain and diagnostic evaluation Details: 22 G 1.5 in needle, lateral approach  Arthrogram: No  Medications: 3 mL lidocaine 1 %; 40 mg methylPREDNISolone acetate 40 MG/ML Outcome: tolerated well, no immediate complications Procedure, treatment alternatives, risks and benefits explained, specific risks discussed. Consent was given by the patient. Immediately prior to procedure a time out was called to verify the correct patient, procedure, equipment, support staff and site/side marked as required. Patient was prepped and draped in the usual sterile fashion.       Clinical Data: No additional findings.   Subjective: Chief Complaint  Patient presents with  . Left Hip - Pain  The patient comes in continues to complain of severe left hip pain.  I have seen her for this before and heavy for an MRI of her left hip.  She had a previous lumbar laminectomy done elsewhere and not in [place] but this was many years ago.  There is no retained hardware in her spine.  This is a laminectomy to  the left at L4.  She is recently had an MRI of her lumbar spine and a CT scan of her abdomen pelvis that was done by primary care physician.  She is here today in the office in tears complaining of her left-sided hip pain and weakness.  She is had several visits to pain management as well.  She denies any groin pain.  She is frustrated.  She does use a cane when she ambulates.  I have actually even performed a left hip trochanteric bursectomy and a Z-lengthening of the IT band in this area to try to calm her symptoms down and this did not help.  This was done remotely.  HPI  Review of Systems She denies any headache or chest pain.  She denies any shortness of breath, fever, chills, nausea, vomiting.  Objective: Vital Signs: There were no vitals taken for this visit.  Physical Exam She is alert and oriented x3 and in no acute distress Ortho Exam Examination of the left hip shows that moves fluidly.  She has pain of the trochanteric area but also issue of pain.  She has a positive straight leg raise left side.  She seems weak in both legs worse on the left than the right. Specialty Comments:  No specialty comments available.  Imaging: No results found. The MRI is independently reviewed and it does  show severe stenosis at multiple levels of lumbar spine especially at L2-L3 more so at L3-L4 and L4-L5.  There is evidence of her previous laminectomy as well.  There is significant foraminal stenosis at the areas to the left.  The CT scan is also reviewed and the left hip appears normal in terms of the bony anatomy of the joint space itself.  PMFS History: Patient Active Problem List   Diagnosis Date Noted  . Pain in left hip 08/11/2017  . Trochanteric bursitis, left hip 05/18/2016   Past Medical History:  Diagnosis Date  . Anxiety   . Left-sided weakness   . Stroke [facility]) 2007    History reviewed. No pertinent family history.  Past Surgical History:  Procedure Laterality Date  . BACK  SURGERY  2002   L3,4,5   . BREAST EXCISIONAL BIOPSY Left   . BREAST LUMPECTOMY Left 1997   benign tumor   . FOOT SURGERY Left 2010   Social History   Occupational History    Employer: DISABLED  Tobacco Use  . Smoking status: Former Smoker    Types: Cigarettes    Last attempt to quit: 11/01/2008    Years since quitting: 9.3  . Smokeless tobacco: Never Used  Substance and Sexual Activity  . Alcohol use: Yes    Comment: rare  . Drug use: No  . Sexual activity: Not Currently

## 2018-04-09 ENCOUNTER — Other Ambulatory Visit (INDEPENDENT_AMBULATORY_CARE_PROVIDER_SITE_OTHER): Payer: Self-pay | Admitting: Orthopaedic Surgery

## 2018-04-10 ENCOUNTER — Other Ambulatory Visit (INDEPENDENT_AMBULATORY_CARE_PROVIDER_SITE_OTHER): Payer: Self-pay | Admitting: Orthopaedic Surgery

## 2018-04-10 NOTE — Telephone Encounter (Signed)
Ok to refill 

## 2018-04-10 NOTE — Telephone Encounter (Signed)
Patient aware.

## 2018-04-10 NOTE — Telephone Encounter (Signed)
Done. Duplicate

## 2018-04-17 NOTE — Patient Instructions (Signed)
Sierra Krueger  04/17/2018   Your procedure is scheduled on: 04-24-18   Report to [facility] Main  Entrance    Report to RADIOLOGY department at 8:00AM    Call this number if you have problems the morning of surgery [phone]     Remember: Do not eat food or drink liquids :After Midnight. BRUSH YOUR TEETH MORNING OF SURGERY AND RINSE YOUR MOUTH OUT, NO CHEWING GUM CANDY OR MINTS.     Take these medicines the morning of surgery with A SIP OF WATER: AMLODIPINE, PROTONIX, ZYRTEC IF NEEDED, FLONASE NASAL SPRAY IF NEEDED                                 You may not have any metal on your body including hair pins and              piercings  Do not wear jewelry, make-up, lotions, powders or perfumes, deodorant             Do not wear nail polish.  Do not shave  48 hours prior to surgery.           Do not bring valuables to the hospital. [place].  Contacts, dentures or bridgework may not be worn into surgery.  Leave suitcase in the car. After surgery it may be brought to your room.                   Please read over the following fact sheets you were given: _____________________________________________________________________             [facility] - Preparing for Surgery Before surgery, you can play an important role.  Because skin is not sterile, your skin needs to be as free of germs as possible.  You can reduce the number of germs on your skin by washing with CHG (chlorahexidine gluconate) soap before surgery.  CHG is an antiseptic cleaner which kills germs and bonds with the skin to continue killing germs even after washing. Please DO NOT use if you have an allergy to CHG or antibacterial soaps.  If your skin becomes reddened/irritated stop using the CHG and inform your nurse when you arrive at Short Stay. Do not shave (including legs and underarms) for at least 48 hours prior to the first CHG shower.   You may shave your face/neck. Please follow these instructions carefully:  1.  Shower with CHG Soap the night before surgery and the  morning of Surgery.  2.  If you choose to wash your hair, wash your hair first as usual with your  normal  shampoo.  3.  After you shampoo, rinse your hair and body thoroughly to remove the  shampoo.                           4.  Use CHG as you would any other liquid soap.  You can apply chg directly  to the skin and wash                       Gently with a scrungie or clean washcloth.  5.  Apply the CHG Soap to your body ONLY  FROM THE NECK DOWN.   Do not use on face/ open                           Wound or open sores. Avoid contact with eyes, ears mouth and genitals (private parts).                       Wash face,  Genitals (private parts) with your normal soap.             6.  Wash thoroughly, paying special attention to the area where your surgery  will be performed.  7.  Thoroughly rinse your body with warm water from the neck down.  8.  DO NOT shower/wash with your normal soap after using and rinsing off  the CHG Soap.                9.  Pat yourself dry with a clean towel.            10.  Wear clean pajamas.            11.  Place clean sheets on your bed the night of your first shower and do not  sleep with pets. Day of Surgery : Do not apply any lotions/deodorants the morning of surgery.  Please wear clean clothes to the hospital/surgery center.  FAILURE TO FOLLOW THESE INSTRUCTIONS MAY RESULT IN THE CANCELLATION OF YOUR SURGERY PATIENT SIGNATURE_________________________________  NURSE SIGNATURE__________________________________  ________________________________________________________________________

## 2018-04-20 ENCOUNTER — Other Ambulatory Visit: Payer: Self-pay

## 2018-04-20 ENCOUNTER — Encounter (HOSPITAL_COMMUNITY): Payer: Self-pay

## 2018-04-20 ENCOUNTER — Encounter (HOSPITAL_COMMUNITY)
Admission: RE | Admit: 2018-04-20 | Discharge: 2018-04-20 | Disposition: A | Discharge status: Home / Self Care | Payer: Medicare Other | Source: Ambulatory Visit | Attending: Urology | Admitting: Urology

## 2018-04-20 ENCOUNTER — Other Ambulatory Visit: Payer: Self-pay | Admitting: Radiology

## 2018-04-20 DIAGNOSIS — Z01818 Encounter for other preprocedural examination: Secondary | ICD-10-CM | POA: Diagnosis not present

## 2018-04-20 HISTORY — DX: Other localized visual field defect, bilateral: H53.453

## 2018-04-20 HISTORY — DX: Dependence on other enabling machines and devices: Z99.89

## 2018-04-20 LAB — CBC
HEMATOCRIT: 40.3 % (ref 36.0–46.0)
Hemoglobin: 12.3 g/dL (ref 12.0–15.0)
MCH: 28.7 pg (ref 26.0–34.0)
MCHC: 30.5 g/dL (ref 30.0–36.0)
MCV: 93.9 fL (ref 80.0–100.0)
NRBC: 0 % (ref 0.0–0.2)
Platelets: 261 10*3/uL (ref 150–400)
RBC: 4.29 MIL/uL (ref 3.87–5.11)
RDW: 14.4 % (ref 11.5–15.5)
WBC: 5.5 10*3/uL (ref 4.0–10.5)

## 2018-04-20 LAB — BASIC METABOLIC PANEL
ANION GAP: 9 (ref 5–15)
BUN: 17 mg/dL (ref 6–20)
CHLORIDE: 106 mmol/L (ref 98–111)
CO2: 24 mmol/L (ref 22–32)
CREATININE: 1.23 mg/dL — AB (ref 0.44–1.00)
Calcium: 10.4 mg/dL — ABNORMAL HIGH (ref 8.9–10.3)
GFR calc Af Amer: 55 mL/min — ABNORMAL LOW (ref >60)
GFR calc non Af Amer: 48 mL/min — ABNORMAL LOW (ref >60)
GLUCOSE: 90 mg/dL (ref 70–99)
Potassium: 4.1 mmol/L (ref 3.5–5.1)
Sodium: 139 mmol/L (ref 135–145)

## 2018-04-22 NOTE — H&P (Signed)
HPI: Sierra Krueger is a 62 year-old female patient with bilateral renal calculi Lt..Rt. who presents today for a left PCNL.  The problem is on both sides. Her symptoms include back pain. Patient denies having flank pain, groin pain, nausea, vomiting, fever, chills, and blood in urine. She first began experiencing symptoms 11/11/2017. She is currently having back pain. She denies having flank pain, groin pain, nausea, vomiting, fever, and chills. She has not caught a stone in her urine strainer since her symptoms began.   She has never had surgical treatment for calculi in the past.   03/14/18: She underwent a CT scan on 01/20/18 that revealed 2 large left renal calculi (17 mm in the lower pole, 1100 HU and 12 mm in renal pelvis, 700 HU) and a large right renal pelvic calculus measuring 9 mm with Hounsfield units of 1100 and a smaller 3 mm lower pole stone.  She reports that in 7/19 after a long trade ride she began experiencing back pain. Her back pain is worsened by bending movements. It is constant. It is not localized to 1 side or the other such as the flank. She does have a history of prior back surgery. She has no prior history stones. She has not experienced any hematuria and has no history of UTIs.     ALLERGIES: Animal dander - Dogs Codeine Derivatives Fresh Fruit Morphine Sulfate TABS Shellfish-derived Products     MEDICATIONS: AmLODIPine Besylate 2.5 MG Oral Tablet Oral  ClonazePAM 1 MG Oral Tablet Oral  Flexeril 10 MG TABS Oral  Mirtazapine  Nucynta  Pantoprazole Sodium  Tizanidine Hcl  TraMADol HCl - 50 MG Oral Tablet Oral  Zolpidem Tartrate  Zolpidem Tartrate 5 MG Oral Tablet Oral     GU PSH: None     PSH Notes: Back Surgery, Foot Surgery, Breast Surgery Lumpectomy   NON-GU PSH: Back surgery Foot surgery (unspecified) Remove Breast Lesion - 2013    GU PMH: None     PMH Notes: .   NON-GU PMH: Cerebral infarction, unspecified, Stroke Syndrome - 2014 Personal  history of other diseases of the circulatory system, History of hypertension - 2014 Personal history of other specified conditions, History of insomnia - 2014 Anxiety Depression GERD Hypertension    FAMILY HISTORY: None   SOCIAL HISTORY: Marital Status: Divorced Preferred Language: English; Ethnicity: Not Hispanic Or Latino; Race: Black or African American Current Smoking Status: Patient does not smoke anymore.   Tobacco Use Assessment Completed: Used Tobacco in last 30 days? Has never drank.  Drinks 1 caffeinated drink per day.     Notes: Caffeine Use, Alcohol Use   REVIEW OF SYSTEMS:    GU Review Female:   Patient reports frequent urination, burning /pain with urination, and get up at night to urinate. Patient denies hard to postpone urination, leakage of urine, stream starts and stops, trouble starting your stream, have to strain to urinate, and being pregnant.  Gastrointestinal (Upper):   Patient denies nausea, vomiting, and indigestion/ heartburn.  Gastrointestinal (Lower):   Patient denies diarrhea and constipation.  Constitutional:   Patient denies fever, night sweats, weight loss, and fatigue.  Skin:   Patient denies skin rash/ lesion and itching.  Eyes:   Patient denies blurred vision and double vision.  Ears/ Nose/ Throat:   Patient denies sore throat and sinus problems.  Hematologic/Lymphatic:   Patient denies swollen glands and easy bruising.  Cardiovascular:   Patient denies leg swelling and chest pains.  Respiratory:   Patient denies  cough and shortness of breath.  Endocrine:   Patient denies excessive thirst.  Musculoskeletal:   Patient denies back pain and joint pain.  Neurological:   Patient denies headaches and dizziness.  Psychologic:   Patient denies depression and anxiety.   VITAL SIGNS: None   MULTI-SYSTEM PHYSICAL EXAMINATION:    Constitutional: Well-nourished. No physical deformities. Normally developed. Good grooming. She is in a wheelchair.  Neck:  Neck symmetrical, not swollen. Normal tracheal position.  Respiratory: No labored breathing, no use of accessory muscles.   Cardiovascular: Normal temperature, normal extremity pulses, no swelling, no varicosities.  Lymphatic: No enlargement of neck, axillae, groin.  Skin: No paleness, no jaundice, no cyanosis. No lesion, no ulcer, no rash.  Neurologic / Psychiatric: Oriented to time, oriented to place, oriented to person. No depression, no anxiety, no agitation.  Gastrointestinal: No mass, no tenderness, no rigidity, non obese abdomen.  Eyes: Normal conjunctivae. Normal eyelids.  Ears, Nose, Mouth, and Throat: Left ear no scars, no lesions, no masses. Right ear no scars, no lesions, no masses. Nose no scars, no lesions, no masses. Normal hearing. Normal lips.  Musculoskeletal: Normal gait and station of head and neck.     PAST DATA REVIEWED:  Source Of History:  Patient  Lab Test Review:   BUN/Creatinine, Calcium  X-Ray Review: C.T. Abdomen/Pelvis: Reviewed Films. Reviewed Report. CT scan 01/20/18: Extensive bilateral nephrolithiasis greater on the left with a 17 mm lower pole stone and an oval 12 mm stone in the renal pelvis near the UPJ. On the right side there was a 9 mm stone MRI Lumbar Spine: Reviewed Report. Lumbar spine MRI on 01/21/18 revealed the 11 mm left renal pelvic calculus and a 6 mm right renal pelvic calculus without hydronephrosis.   Notes:                     In 5/19 her creatinine was 1.28 with an elevated serum calcium of 11.2.   PROCEDURES: None   ASSESSMENT/PLAN:      ICD-10 Details  1 GU:   Renal calculus - N20.0 Bilateral, Because of obstruction, larger stone burden and what appears to be some chronic obstructive changes of the kidney on the left-hand side I have recommended proceeding with left PCNL. I told her that I thought there was a very low likelihood that the pain she was experiencing in her back was from her stones although it is remotely possible however  unlikely. She understands this. Has elected to proceed with surgery.  2 NON-GU:   Hypercalcemia - E83.52 She was found to have hypercalcemia. Because of this I am going to blood work to rule out hyperparathyroidism. He          Notes:   She has 2 large stones in the left kidney one in the lower pole 1 in the renal pelvis without obstruction. There is a stone in the right renal pelvis as well but it's not obstructing the kidney. There is some parenchymal loss on the left-hand side. The stones have Hounsfield units that range from 700-900. She also has hypercalcemia.   We discussed the management of urinary stones. These options include observation, ureteroscopy, shockwave lithotripsy, and PCNL. We discussed which options are relevant to these particular stones. We discussed the natural history of stones as well as the complications of untreated stones and the impact on quality of life without treatment as well as with each of the above listed treatments. We also discussed the efficacy of each  treatment in its ability to clear the stone burden. With any of these management options I discussed the signs and symptoms of infection and the need for emergent treatment should these be experienced. For each option we discussed the ability of each procedure to clear the patient of their stone burden.   For observation I described the risks which include but are not limited to silent renal damage, life-threatening infection, need for emergent surgery, failure to pass stone, and pain.   For ureteroscopy I described the risks which include heart attack, stroke, pulmonary embolus, death, bleeding, infection, damage to contiguous structures, positioning injury, ureteral stricture, ureteral avulsion, ureteral injury, need for ureteral stent, inability to perform ureteroscopy, need for an interval procedure, inability to clear stone burden, stent discomfort and pain.   For shockwave lithotripsy I described the risks which  include arrhythmia, kidney contusion, kidney hemorrhage, need for transfusion, long-term risk of diabetes or hypertension, back discomfort, flank ecchymosis, flank abrasion, inability to break up stone, inability to pass stone fragments, Steinstrasse, infection associated with obstructing stones, need for different surgical procedure and possible need for repeat shockwave lithotripsy.   For PCNL I described the risks including heart attack, sure, pulmonary embolus, death, positioning injury, pneumothorax, hydrothorax, need for chest tube, inability to clear stone burden, renal laceration, arterial venous fistula or malformation, need for embolization of kidney, loss of kidney or renal function, need for repeat procedure, need for prolonged nephrostomy tube, ureteral avulsion, fistula.  I drew her a picture to help explain how the procedure is performed in its entirety. We discussed the need for an overnight stay in the hospital and the possible need for a nephrostomy tube. I told her she would have a ureteral stent in place.

## 2018-04-22 NOTE — Discharge Instructions (Signed)

## 2018-04-24 ENCOUNTER — Other Ambulatory Visit: Payer: Self-pay

## 2018-04-24 ENCOUNTER — Ambulatory Visit (HOSPITAL_COMMUNITY): Payer: Medicare Other

## 2018-04-24 ENCOUNTER — Encounter (HOSPITAL_COMMUNITY)
Admission: RE | Disposition: A | Discharge status: Home / Self Care | Payer: Self-pay | Source: Home / Self Care | Attending: Urology

## 2018-04-24 ENCOUNTER — Ambulatory Visit (HOSPITAL_COMMUNITY): Payer: Medicare Other | Admitting: Anesthesiology

## 2018-04-24 ENCOUNTER — Ambulatory Visit (INDEPENDENT_AMBULATORY_CARE_PROVIDER_SITE_OTHER): Payer: Medicare Other | Admitting: Specialist

## 2018-04-24 ENCOUNTER — Ambulatory Visit (HOSPITAL_COMMUNITY)
Admission: RE | Admit: 2018-04-24 | Discharge: 2018-04-25 | Disposition: A | Discharge status: Home / Self Care | Payer: Medicare Other | Attending: Urology | Admitting: Urology

## 2018-04-24 ENCOUNTER — Ambulatory Visit (HOSPITAL_COMMUNITY)
Admission: RE | Admit: 2018-04-24 | Discharge: 2018-04-24 | Disposition: A | Discharge status: Home / Self Care | Payer: Medicare Other | Source: Ambulatory Visit | Attending: Urology | Admitting: Urology

## 2018-04-24 ENCOUNTER — Encounter (HOSPITAL_COMMUNITY): Payer: Self-pay | Admitting: *Deleted

## 2018-04-24 DIAGNOSIS — Z87891 Personal history of nicotine dependence: Secondary | ICD-10-CM

## 2018-04-24 DIAGNOSIS — K219 Gastro-esophageal reflux disease without esophagitis: Secondary | ICD-10-CM | POA: Diagnosis not present

## 2018-04-24 DIAGNOSIS — Z8673 Personal history of transient ischemic attack (TIA), and cerebral infarction without residual deficits: Secondary | ICD-10-CM

## 2018-04-24 DIAGNOSIS — N200 Calculus of kidney: Secondary | ICD-10-CM

## 2018-04-24 DIAGNOSIS — F329 Major depressive disorder, single episode, unspecified: Secondary | ICD-10-CM | POA: Insufficient documentation

## 2018-04-24 DIAGNOSIS — Z7982 Long term (current) use of aspirin: Secondary | ICD-10-CM | POA: Insufficient documentation

## 2018-04-24 DIAGNOSIS — I10 Essential (primary) hypertension: Secondary | ICD-10-CM | POA: Diagnosis not present

## 2018-04-24 DIAGNOSIS — R531 Weakness: Secondary | ICD-10-CM | POA: Insufficient documentation

## 2018-04-24 DIAGNOSIS — Z79899 Other long term (current) drug therapy: Secondary | ICD-10-CM | POA: Insufficient documentation

## 2018-04-24 DIAGNOSIS — Z885 Allergy status to narcotic agent status: Secondary | ICD-10-CM | POA: Insufficient documentation

## 2018-04-24 DIAGNOSIS — G4700 Insomnia, unspecified: Secondary | ICD-10-CM | POA: Diagnosis not present

## 2018-04-24 DIAGNOSIS — F419 Anxiety disorder, unspecified: Secondary | ICD-10-CM | POA: Diagnosis not present

## 2018-04-24 DIAGNOSIS — Z436 Encounter for attention to other artificial openings of urinary tract: Secondary | ICD-10-CM

## 2018-04-24 HISTORY — PX: IR URETERAL STENT LEFT NEW ACCESS W/O SEP NEPHROSTOMY CATH: IMG6075

## 2018-04-24 HISTORY — PX: NEPHROLITHOTOMY: SHX5134

## 2018-04-24 LAB — BASIC METABOLIC PANEL
Anion gap: 12 (ref 5–15)
BUN: 16 mg/dL (ref 6–20)
CALCIUM: 10.6 mg/dL — AB (ref 8.9–10.3)
CO2: 21 mmol/L — ABNORMAL LOW (ref 22–32)
Chloride: 107 mmol/L (ref 98–111)
Creatinine, Ser: 1.08 mg/dL — ABNORMAL HIGH (ref 0.44–1.00)
GFR calc Af Amer: >60 mL/min (ref >60)
GFR calc non Af Amer: 56 mL/min — ABNORMAL LOW (ref >60)
Glucose, Bld: 99 mg/dL (ref 70–99)
Potassium: 3.9 mmol/L (ref 3.5–5.1)
Sodium: 140 mmol/L (ref 135–145)

## 2018-04-24 LAB — CBC WITH DIFFERENTIAL/PLATELET
Abs Immature Granulocytes: 0.01 10*3/uL (ref 0.00–0.07)
Basophils Absolute: 0 10*3/uL (ref 0.0–0.1)
Basophils Relative: 1 %
Eosinophils Absolute: 0.1 10*3/uL (ref 0.0–0.5)
Eosinophils Relative: 3 %
HCT: 40.5 % (ref 36.0–46.0)
Hemoglobin: 12.6 g/dL (ref 12.0–15.0)
Immature Granulocytes: 0 %
Lymphocytes Relative: 44 %
Lymphs Abs: 2.1 10*3/uL (ref 0.7–4.0)
MCH: 29 pg (ref 26.0–34.0)
MCHC: 31.1 g/dL (ref 30.0–36.0)
MCV: 93.1 fL (ref 80.0–100.0)
MONO ABS: 0.3 10*3/uL (ref 0.1–1.0)
Monocytes Relative: 7 %
Neutro Abs: 2.2 10*3/uL (ref 1.7–7.7)
Neutrophils Relative %: 45 %
Platelets: 257 10*3/uL (ref 150–400)
RBC: 4.35 MIL/uL (ref 3.87–5.11)
RDW: 14.5 % (ref 11.5–15.5)
WBC: 4.8 10*3/uL (ref 4.0–10.5)
nRBC: 0 % (ref 0.0–0.2)

## 2018-04-24 LAB — PROTIME-INR
INR: 1.04
Prothrombin Time: 13.5 seconds (ref 11.4–15.2)

## 2018-04-24 SURGERY — NEPHROLITHOTOMY PERCUTANEOUS
Anesthesia: General | Laterality: Left

## 2018-04-24 MED ORDER — OXYBUTYNIN CHLORIDE 5 MG PO TABS: 5.0000 mg | ORAL_TABLET | Freq: Three times a day (TID) | ORAL | Status: DC | PRN

## 2018-04-24 MED ORDER — OXYCODONE-ACETAMINOPHEN 5-325 MG PO TABS
1.0000 | ORAL_TABLET | ORAL | Status: DC | PRN
  Administered 2018-04-25 (×2): 2 via ORAL
  Filled 2018-04-24 (×2): qty 2

## 2018-04-24 MED ORDER — SUGAMMADEX SODIUM 500 MG/5ML IV SOLN
INTRAVENOUS | Status: AC
  Filled 2018-04-24: qty 5

## 2018-04-24 MED ORDER — FENTANYL CITRATE (PF) 100 MCG/2ML IJ SOLN
INTRAMUSCULAR | Status: AC
  Filled 2018-04-24: qty 2

## 2018-04-24 MED ORDER — MIDAZOLAM HCL 2 MG/2ML IJ SOLN
INTRAMUSCULAR | Status: AC
  Filled 2018-04-24: qty 6

## 2018-04-24 MED ORDER — FENTANYL CITRATE (PF) 100 MCG/2ML IJ SOLN
25.0000 ug | INTRAMUSCULAR | Status: DC | PRN
  Administered 2018-04-24 (×3): 50 ug via INTRAVENOUS

## 2018-04-24 MED ORDER — ACETAMINOPHEN 325 MG PO TABS: 650.0000 mg | ORAL_TABLET | ORAL | Status: DC | PRN

## 2018-04-24 MED ORDER — LIDOCAINE 2% (20 MG/ML) 5 ML SYRINGE
INTRAMUSCULAR | Status: AC
  Filled 2018-04-24: qty 5

## 2018-04-24 MED ORDER — ONDANSETRON HCL 4 MG/2ML IJ SOLN
INTRAMUSCULAR | Status: AC
  Filled 2018-04-24: qty 2

## 2018-04-24 MED ORDER — FENTANYL CITRATE (PF) 250 MCG/5ML IJ SOLN
INTRAMUSCULAR | Status: DC | PRN
  Administered 2018-04-24 (×3): 50 ug via INTRAVENOUS
  Administered 2018-04-24: 100 ug via INTRAVENOUS
  Administered 2018-04-24 (×2): 50 ug via INTRAVENOUS

## 2018-04-24 MED ORDER — MEPERIDINE HCL 50 MG/ML IJ SOLN
6.2500 mg | INTRAMUSCULAR | Status: DC | PRN
  Administered 2018-04-24: 12.5 mg via INTRAVENOUS

## 2018-04-24 MED ORDER — DEXAMETHASONE SODIUM PHOSPHATE 10 MG/ML IJ SOLN
INTRAMUSCULAR | Status: DC | PRN
  Administered 2018-04-24: 10 mg via INTRAVENOUS

## 2018-04-24 MED ORDER — IOPAMIDOL (ISOVUE-300) INJECTION 61%
INTRAVENOUS | Status: AC
  Administered 2018-04-24: 5 mL
  Filled 2018-04-24: qty 50

## 2018-04-24 MED ORDER — LIDOCAINE HCL 1 % IJ SOLN
INTRAMUSCULAR | Status: AC
  Filled 2018-04-24: qty 20

## 2018-04-24 MED ORDER — ZOLPIDEM TARTRATE 5 MG PO TABS
5.0000 mg | ORAL_TABLET | Freq: Once | ORAL | Status: AC
  Administered 2018-04-24: 5 mg via ORAL
  Filled 2018-04-24: qty 1

## 2018-04-24 MED ORDER — PHENYLEPHRINE 40 MCG/ML (10ML) SYRINGE FOR IV PUSH (FOR BLOOD PRESSURE SUPPORT)
PREFILLED_SYRINGE | INTRAVENOUS | Status: DC | PRN
  Administered 2018-04-24: 80 ug via INTRAVENOUS

## 2018-04-24 MED ORDER — CEFAZOLIN SODIUM-DEXTROSE 1-4 GM/50ML-% IV SOLN
1.0000 g | Freq: Three times a day (TID) | INTRAVENOUS | Status: AC
  Administered 2018-04-24 – 2018-04-25 (×2): 1 g via INTRAVENOUS
  Filled 2018-04-24 (×2): qty 50

## 2018-04-24 MED ORDER — DEXAMETHASONE SODIUM PHOSPHATE 10 MG/ML IJ SOLN
INTRAMUSCULAR | Status: AC
  Filled 2018-04-24: qty 1

## 2018-04-24 MED ORDER — SODIUM CHLORIDE 0.9 % IV SOLN
INTRAVENOUS | Status: DC
  Administered 2018-04-25: 03:00:00 via INTRAVENOUS

## 2018-04-24 MED ORDER — FENTANYL CITRATE (PF) 100 MCG/2ML IJ SOLN
INTRAMUSCULAR | Status: AC | PRN
  Administered 2018-04-24 (×2): 50 ug via INTRAVENOUS

## 2018-04-24 MED ORDER — SODIUM CHLORIDE 0.9 % IR SOLN
Status: DC | PRN
  Administered 2018-04-24: 18000 mL

## 2018-04-24 MED ORDER — ROCURONIUM BROMIDE 10 MG/ML (PF) SYRINGE
PREFILLED_SYRINGE | INTRAVENOUS | Status: AC
  Filled 2018-04-24: qty 10

## 2018-04-24 MED ORDER — MIDAZOLAM HCL 2 MG/2ML IJ SOLN
INTRAMUSCULAR | Status: AC
  Filled 2018-04-24: qty 2

## 2018-04-24 MED ORDER — MIDAZOLAM HCL 2 MG/2ML IJ SOLN
INTRAMUSCULAR | Status: AC | PRN
  Administered 2018-04-24 (×3): 1 mg via INTRAVENOUS

## 2018-04-24 MED ORDER — ONDANSETRON HCL 4 MG/2ML IJ SOLN
INTRAMUSCULAR | Status: DC | PRN
  Administered 2018-04-24: 4 mg via INTRAVENOUS

## 2018-04-24 MED ORDER — LIDOCAINE 2% (20 MG/ML) 5 ML SYRINGE
INTRAMUSCULAR | Status: DC | PRN
  Administered 2018-04-24: 100 mg via INTRAVENOUS

## 2018-04-24 MED ORDER — MIDAZOLAM HCL 5 MG/5ML IJ SOLN
INTRAMUSCULAR | Status: DC | PRN
  Administered 2018-04-24: 2 mg via INTRAVENOUS

## 2018-04-24 MED ORDER — SODIUM CHLORIDE 0.9 % IV SOLN: INTRAVENOUS | Status: DC

## 2018-04-24 MED ORDER — CEFAZOLIN SODIUM-DEXTROSE 2-4 GM/100ML-% IV SOLN
INTRAVENOUS | Status: AC
  Administered 2018-04-24: 2000 mg
  Filled 2018-04-24: qty 100

## 2018-04-24 MED ORDER — SUGAMMADEX SODIUM 200 MG/2ML IV SOLN
INTRAVENOUS | Status: DC | PRN
  Administered 2018-04-24: 300 mg via INTRAVENOUS

## 2018-04-24 MED ORDER — PROMETHAZINE HCL 25 MG/ML IJ SOLN: 6.2500 mg | INTRAMUSCULAR | Status: DC | PRN

## 2018-04-24 MED ORDER — 0.9 % SODIUM CHLORIDE (POUR BTL) OPTIME
TOPICAL | Status: DC | PRN
  Administered 2018-04-24: 1000 mL

## 2018-04-24 MED ORDER — LACTATED RINGERS IV SOLN
INTRAVENOUS | Status: DC
  Administered 2018-04-24 (×2): via INTRAVENOUS

## 2018-04-24 MED ORDER — ACETAMINOPHEN 10 MG/ML IV SOLN: 1000.0000 mg | Freq: Once | INTRAVENOUS | Status: DC | PRN

## 2018-04-24 MED ORDER — FENTANYL CITRATE (PF) 100 MCG/2ML IJ SOLN
INTRAMUSCULAR | Status: AC
  Filled 2018-04-24: qty 4

## 2018-04-24 MED ORDER — CEFAZOLIN SODIUM-DEXTROSE 2-4 GM/100ML-% IV SOLN: 2.0000 g | INTRAVENOUS | Status: DC

## 2018-04-24 MED ORDER — ENSURE ENLIVE PO LIQD
237.0000 mL | Freq: Two times a day (BID) | ORAL | Status: DC
  Administered 2018-04-24: 237 mL via ORAL

## 2018-04-24 MED ORDER — HYDROMORPHONE HCL 1 MG/ML IJ SOLN
0.5000 mg | INTRAMUSCULAR | Status: DC | PRN
  Administered 2018-04-24 (×3): 1 mg via INTRAVENOUS
  Filled 2018-04-24 (×4): qty 1

## 2018-04-24 MED ORDER — ONDANSETRON HCL 4 MG/2ML IJ SOLN: 4.0000 mg | INTRAMUSCULAR | Status: DC | PRN

## 2018-04-24 MED ORDER — PROPOFOL 10 MG/ML IV BOLUS
INTRAVENOUS | Status: DC | PRN
  Administered 2018-04-24: 40 mg via INTRAVENOUS
  Administered 2018-04-24: 150 mg via INTRAVENOUS
  Administered 2018-04-24: 10 mg via INTRAVENOUS

## 2018-04-24 MED ORDER — FENTANYL CITRATE (PF) 250 MCG/5ML IJ SOLN
INTRAMUSCULAR | Status: AC
  Filled 2018-04-24: qty 5

## 2018-04-24 MED ORDER — ROCURONIUM BROMIDE 10 MG/ML (PF) SYRINGE
PREFILLED_SYRINGE | INTRAVENOUS | Status: DC | PRN
  Administered 2018-04-24: 20 mg via INTRAVENOUS
  Administered 2018-04-24: 50 mg via INTRAVENOUS

## 2018-04-24 MED ORDER — PROPOFOL 10 MG/ML IV BOLUS
INTRAVENOUS | Status: AC
  Filled 2018-04-24: qty 20

## 2018-04-24 MED ORDER — CEFAZOLIN SODIUM-DEXTROSE 2-4 GM/100ML-% IV SOLN: 2.0000 g | Freq: Once | INTRAVENOUS | Status: DC

## 2018-04-24 MED ORDER — BUPIVACAINE-EPINEPHRINE (PF) 0.25% -1:200000 IJ SOLN
INTRAMUSCULAR | Status: AC
  Filled 2018-04-24: qty 30

## 2018-04-24 MED ORDER — MEPERIDINE HCL 50 MG/ML IJ SOLN
INTRAMUSCULAR | Status: AC
  Filled 2018-04-24: qty 1

## 2018-04-24 MED ORDER — LIDOCAINE HCL (PF) 1 % IJ SOLN
INTRAMUSCULAR | Status: AC | PRN
  Administered 2018-04-24: 10 mL

## 2018-04-24 SURGICAL SUPPLY — 53 items
AGENT HMST KT MTR STRL THRMB (HEMOSTASIS) ×1
APL ESCP 34 STRL LF DISP (HEMOSTASIS) ×1
APL SKNCLS STERI-STRIP NONHPOA (GAUZE/BANDAGES/DRESSINGS) ×1
APPLICATOR SURGIFLO ENDO (HEMOSTASIS) ×1 IMPLANT
BAG URINE DRAINAGE (UROLOGICAL SUPPLIES) IMPLANT
BASKET ZERO TIP NITINOL 2.4FR (BASKET) ×1 IMPLANT
BENZOIN TINCTURE PRP APPL 2/3 (GAUZE/BANDAGES/DRESSINGS) ×2 IMPLANT
BLADE SURG 15 STRL LF DISP TIS (BLADE) ×1 IMPLANT
BLADE SURG 15 STRL SS (BLADE) ×2
BSKT STON RTRVL ZERO TP 2.4FR (BASKET) ×1
CATH FOLEY 2W COUNCIL 20FR 5CC (CATHETERS) IMPLANT
CATH FOLEY 2WAY SLVR  5CC 18FR (CATHETERS)
CATH FOLEY 2WAY SLVR 5CC 18FR (CATHETERS) IMPLANT
CATH IMAGER II 65CM (CATHETERS) IMPLANT
CATH X-FORCE N30 NEPHROSTOMY (TUBING) ×2 IMPLANT
CHLORAPREP W/TINT 26ML (MISCELLANEOUS) ×2 IMPLANT
COVER SURGICAL LIGHT HANDLE (MISCELLANEOUS) ×2 IMPLANT
COVER WAND RF STERILE (DRAPES) IMPLANT
DRAPE C-ARM 42X120 X-RAY (DRAPES) ×2 IMPLANT
DRAPE LINGEMAN PERC (DRAPES) ×2 IMPLANT
DRAPE SURG IRRIG POUCH 19X23 (DRAPES) ×2 IMPLANT
DRSG PAD ABDOMINAL 8X10 ST (GAUZE/BANDAGES/DRESSINGS) ×4 IMPLANT
DRSG TEGADERM 8X12 (GAUZE/BANDAGES/DRESSINGS) ×2 IMPLANT
EXTRACTOR STONE PERC NCIRCLE (MISCELLANEOUS) ×1 IMPLANT
FIBER LASER FLEXIVA 365 (UROLOGICAL SUPPLIES) ×1 IMPLANT
FIBER LASER TRAC TIP (UROLOGICAL SUPPLIES) IMPLANT
GAUZE SPONGE 4X4 12PLY STRL (GAUZE/BANDAGES/DRESSINGS) ×2 IMPLANT
GLOVE BIOGEL M 8.0 STRL (GLOVE) ×2 IMPLANT
GOWN STRL REUS W/TWL XL LVL3 (GOWN DISPOSABLE) ×2 IMPLANT
GUIDEWIRE AMPLAZ .035X145 (WIRE) ×4 IMPLANT
GUIDEWIRE STR DUAL SENSOR (WIRE) IMPLANT
HOLDER FOLEY CATH W/STRAP (MISCELLANEOUS) ×2 IMPLANT
KIT BASIN OR (CUSTOM PROCEDURE TRAY) ×2 IMPLANT
KIT PROBE TRILOGY 3.9X350 (MISCELLANEOUS) ×1 IMPLANT
MANIFOLD NEPTUNE II (INSTRUMENTS) ×2 IMPLANT
NS IRRIG 1000ML POUR BTL (IV SOLUTION) ×2 IMPLANT
PACK CYSTO (CUSTOM PROCEDURE TRAY) ×2 IMPLANT
PAD ABD 8X10 STRL (GAUZE/BANDAGES/DRESSINGS) ×2 IMPLANT
PROBE LITHOCLAST ULTRA 3.8X403 (UROLOGICAL SUPPLIES) IMPLANT
PROBE PNEUMATIC 1.0MMX570MM (UROLOGICAL SUPPLIES) IMPLANT
SHEATH PEELAWAY SET 9 (SHEATH) ×2 IMPLANT
SPONGE LAP 4X18 RFD (DISPOSABLE) ×2 IMPLANT
STENT URET 6FRX24 CONTOUR (STENTS) ×1 IMPLANT
SURGIFLO W/THROMBIN 8M KIT (HEMOSTASIS) ×1 IMPLANT
SUT MNCRL AB 4-0 PS2 18 (SUTURE) ×1 IMPLANT
SUT SILK 2 0 30  PSL (SUTURE)
SUT SILK 2 0 30 PSL (SUTURE) IMPLANT
SYR 10ML LL (SYRINGE) ×2 IMPLANT
SYR 20CC LL (SYRINGE) ×4 IMPLANT
TOWEL OR 17X26 10 PK STRL BLUE (TOWEL DISPOSABLE) ×2 IMPLANT
TOWEL OR NON WOVEN STRL DISP B (DISPOSABLE) ×2 IMPLANT
TRAY FOLEY MTR SLVR 16FR STAT (SET/KITS/TRAYS/PACK) ×2 IMPLANT
TUBING CONNECTING 10 (TUBING) ×4 IMPLANT

## 2018-04-24 NOTE — Anesthesia Postprocedure Evaluation (Signed)
Anesthesia Post Note  Patient: Sierra Krueger  Procedure(s) Performed: NEPHROLITHOTOMY PERCUTANEOUS (Left )     Patient location during evaluation: PACU Anesthesia Type: General Level of consciousness: awake and alert Pain management: pain level controlled Vital Signs Assessment: post-procedure vital signs reviewed and stable Respiratory status: spontaneous breathing, nonlabored ventilation and respiratory function stable Cardiovascular status: blood pressure returned to baseline and stable Postop Assessment: no apparent nausea or vomiting Anesthetic complications: no    Last Vitals:  Vitals:   04/24/18 1415 04/24/18 1430  BP: 125/87 (!) 141/85  Pulse: 88 96  Resp: 16 20  Temp:    SpO2: 97% 98%    Last Pain:  Vitals:   04/24/18 1430  TempSrc:   PainSc: [name]

## 2018-04-24 NOTE — Transfer of Care (Signed)
Immediate Anesthesia Transfer of Care Note  Patient: Sierra Krueger  Procedure(s) Performed: NEPHROLITHOTOMY PERCUTANEOUS (Left )  Patient Location: PACU  Anesthesia Type:General  Level of Consciousness: awake, alert , oriented and patient cooperative  Airway & Oxygen Therapy: Patient Spontanous Breathing and Patient connected to face mask oxygen  Post-op Assessment: Report given to RN, Post -op Vital signs reviewed and stable and Patient moving all extremities  Post vital signs: Reviewed and stable  Last Vitals:  Vitals Value Taken Time  BP 191/179 04/24/2018  1:30 PM  Temp 36.6 C 04/24/2018  1:22 PM  Pulse 109 04/24/2018  1:31 PM  Resp 20 04/24/2018  1:32 PM  SpO2 100 % 04/24/2018  1:31 PM  Vitals shown include unvalidated device data.  Last Pain:  Vitals:   04/24/18 1026  TempSrc: Oral         Complications: No apparent anesthesia complications

## 2018-04-24 NOTE — Anesthesia Preprocedure Evaluation (Addendum)
Anesthesia Evaluation    Reviewed: Allergy & Precautions, Patient's Chart, lab work & pertinent test results  Airway Mallampati: II  TM Distance: >3 FB Neck ROM: Full    Dental  (+) Dental Advisory Given, Partial Upper, Poor Dentition, Missing,    Pulmonary former smoker,    Pulmonary exam normal breath sounds clear to auscultation       Cardiovascular hypertension, Pt. on medications Normal cardiovascular exam Rhythm:Regular Rate:Normal     Neuro/Psych Anxiety CVA (2007, left sided weakness, walks with cane), Residual Symptoms    GI/Hepatic Neg liver ROS, GERD  Medicated,  Endo/Other  negative endocrine ROS  Renal/GU Left renal stone     Musculoskeletal negative musculoskeletal ROS (+)   Abdominal   Peds  Hematology negative hematology ROS (+)   Anesthesia Other Findings Day of surgery medications reviewed with the patient.  Reproductive/Obstetrics                            Anesthesia Physical Anesthesia Plan  ASA: II  Anesthesia Plan: General   Post-op Pain Management:    Induction: Intravenous  PONV Risk Score and Plan: 3 and Treatment may vary due to age or medical condition, Ondansetron, Dexamethasone and Midazolam  Airway Management Planned: Oral ETT  Additional Equipment:   Intra-op Plan:   Post-operative Plan: Extubation in OR  Informed Consent: I have reviewed the patients History and Physical, chart, labs and discussed the procedure including the risks, benefits and alternatives for the proposed anesthesia with the patient or authorized representative who has indicated his/her understanding and acceptance.   Dental advisory given  Plan Discussed with: CRNA  Anesthesia Plan Comments:        Anesthesia Quick Evaluation

## 2018-04-24 NOTE — Consult Note (Addendum)
Chief Complaint: Patient was seen in consultation today for left percutaneous nephrostomy/nephroureteral catheter placement  Referring Physician(s): [place]  Supervising Physician: Sandi Mariscal  Patient Status: [facility] - Out-pt TBA  History of Present Illness: Sierra Krueger is a 62 y.o. female with prior history of stroke in 2007 with some residual left-sided weakness as well as nephrolithiasis bilaterally more so on the left.  She presents today for left percutaneous nephrostomy/nephroureteral catheter placement prior to nephrolithotomy.   Past Medical History:  Diagnosis Date  . Ambulates with cane   . Anxiety   . Decreased peripheral vision of both eyes    s/p 2007 stroke   . Elevated blood-pressure reading without diagnosis of hypertension    reports was started on BP medication at time of her stroke in 2007 and just remained on it but has never been dx with htn   . Kidney stone on left side   . Left-sided weakness   . Stroke [facility]) 2007    Past Surgical History:  Procedure Laterality Date  . BACK SURGERY  2002   L3,4,5   . BREAST EXCISIONAL BIOPSY Left   . BREAST LUMPECTOMY Left 1997   benign tumor   . FOOT SURGERY Left 2010  . hip bursectomy  Left 2018    Allergies: Codeine; Morphine and related; and Shellfish allergy  Medications: Prior to Admission medications   Medication Sig Start Date End Date Taking? Authorizing Provider  amLODipine (NORVASC) 2.5 MG tablet Take 2.5 mg by mouth daily.    [provider]  aspirin 325 MG tablet Take 325 mg by mouth daily.    [provider]  cetirizine (ZYRTEC) 10 MG tablet Take 10 mg by mouth daily as needed for allergies.    [provider]  diclofenac sodium (VOLTAREN) 1 % GEL Apply 1 application topically 4 (four) times daily as needed for pain. 02/13/18   [provider]  fluticasone (FLONASE) 50 MCG/ACT nasal spray Place 1 spray into both nostrils daily as needed for allergies or  rhinitis.    [provider]  HYDROcodone-acetaminophen (NORCO/VICODIN) 5-325 MG tablet Take 1 tablet by mouth 4 (four) times daily as needed for moderate pain.    [provider]  meloxicam (MOBIC) 15 MG tablet TAKE 1 TABLET(15 MG) BY MOUTH DAILY Patient taking differently: Take 15 mg by mouth daily.  04/10/18   Mcarthur Rossetti, MD  mirtazapine (REMERON) 15 MG tablet Take 15 mg by mouth every evening.    [provider]  Multiple Vitamin (MULTIVITAMIN) tablet Take 1 tablet by mouth daily.    [provider]  pantoprazole (PROTONIX) 40 MG tablet Take 40 mg by mouth 2 (two) times daily.    [provider]  tiZANidine (ZANAFLEX) 4 MG tablet Take 4 mg by mouth 2 (two) times daily as needed for muscle spasms.    [provider]  zolpidem (AMBIEN) 10 MG tablet Take 10 mg by mouth at bedtime as needed for sleep.    [provider]     No family history on file.  Social History   Socioeconomic History  . Marital status: Unknown    Spouse name: Not on file  . Number of children: 2  . Years of education: Not on file  . Highest education level: Not on file  Occupational History    Employer: DISABLED  Social Needs  . Financial resource strain: Not on file  . Food insecurity:    Worry: Not on file  Inability: Not on file  . Transportation needs:    Medical: Not on file    Non-medical: Not on file  Tobacco Use  . Smoking status: Former Smoker    Types: Cigarettes    Last attempt to quit: 11/01/2008    Years since quitting: 9.4  . Smokeless tobacco: Never Used  Substance and Sexual Activity  . Alcohol use: Not Currently  . Drug use: No  . Sexual activity: Not Currently  Lifestyle  . Physical activity:    Days per week: Not on file    Minutes per session: Not on file  . Stress: Not on file  Relationships  . Social connections:    Talks on phone: Not on file    Gets together: Not on file    Attends religious  service: Not on file    Active member of club or organization: Not on file    Attends meetings of clubs or organizations: Not on file    Relationship status: Not on file  Other Topics Concern  . Not on file  Social History Narrative  . Not on file      Review of Systems he denies fever, headache, chest pain, dyspnea, cough, abdominal pain, vomiting or abnormal bleeding.  She does have back pain and occasional nausea.  Vital Signs: BP 137/90   Pulse 95   Temp 98.2 F (36.8 C) (Oral)   Resp 16   SpO2 96%   Physical Exam awake, alert.  Chest clear to auscultation bilaterally.  Heart with regular rate and rhythm.  Abdomen soft, positive bowel sounds, nontender.    No significant lower extremity edema.  Mild left upper and lower extremity weakness.  Imaging: No results found.  Labs:  CBC: Recent Labs    04/20/18 1152 04/24/18 0803  WBC 5.5 4.8  HGB 12.3 12.6  HCT 40.3 40.5  PLT 261 257    COAGS: No results for input(s): INR, APTT in the last 8760 hours.  BMP: Recent Labs    04/20/18 1152  NA 139  K 4.1  CL 106  CO2 24  GLUCOSE 90  BUN 17  CALCIUM 10.4*  CREATININE 1.23*  GFRNONAA 48*  GFRAA 55*    LIVER FUNCTION TESTS: No results for input(s): BILITOT, AST, ALT, ALKPHOS, PROT, ALBUMIN in the last 8760 hours.  TUMOR MARKERS: No results for input(s): AFPTM, CEA, CA199, CHROMGRNA in the last 8760 hours.  Assessment and Plan: 62 y.o. female with prior history of stroke in 2007 with some residual left-sided weakness as well as nephrolithiasis bilaterally more so on the left.  She presents today for left percutaneous nephrostomy/nephroureteral catheter placement prior to nephrolithotomy.Risks and benefits of procedure were discussed with the patient/family including, but not limited to, infection, bleeding, significant bleeding causing loss or decrease in renal function or damage to adjacent structures.   All of the patient's questions were answered, patient  is agreeable to proceed.  Consent signed and in chart.      Thank you for this interesting consult.  I greatly enjoyed meeting Sierra Krueger and look forward to participating in their care.  A copy of this report was sent to the requesting provider on this date.  Electronically Signed: D. Rowe Robert, PA-C 04/24/2018, 8:29 AM   I spent a total of 25 minutes    in face to face in clinical consultation, greater than 50% of which was counseling/coordinating care for left percutaneous nephrostomy/nephroureteral catheter placement

## 2018-04-24 NOTE — Progress Notes (Signed)
Patient ID: Sierra Krueger, female   DOB: 1955-08-08, 62 y.o.   MRN: [id number] Night of surgery note  Subjective: The patient is doing well.  No complaints.  Objective: Vital signs in last 24 hours: Temp:  [97.6 F (36.4 C)-98.2 F (36.8 C)] 97.6 F (36.4 C) (12/30 1452) Pulse Rate:  [88-110] 110 (12/30 1452) Resp:  [14-20] 16 (12/30 1452) BP: (121-162)/(84-99) 141/86 (12/30 1452) SpO2:  [92 %-100 %] 98 % (12/30 1452) Weight:  [73 kg] 73 kg (12/30 1711)  Intake/Output this shift: Total I/O In: 1813.3 [P.O.:240; I.V.:1573.3] Out: 420 [Urine:400; Blood:20]  Physical Exam:  General: Alert and oriented. Abdomen: Soft, Nondistended. Incision: Clean with dry, intact dressing.  Lab Results: Recent Labs    04/24/18 0803  HGB 12.6  HCT 40.5    Assessment/Plan: 1) Continue to monitor 2) Per orders   Sierra Stenzel C. Karsten Ro, MD  Sierra Krueger 04/24/2018, 5:44 PM

## 2018-04-24 NOTE — Anesthesia Procedure Notes (Signed)
Procedure Name: Intubation Date/Time: 04/24/2018 11:02 AM Performed by: Mitzie Na, CRNA Pre-anesthesia Checklist: Patient identified, Emergency Drugs available, Suction available, Patient being monitored and Timeout performed Patient Re-evaluated:Patient Re-evaluated prior to induction Oxygen Delivery Method: Circle system utilized Preoxygenation: Pre-oxygenation with 100% oxygen Induction Type: IV induction Ventilation: Mask ventilation without difficulty Laryngoscope Size: Mac and 3 Grade View: Grade I Tube type: Oral Tube size: 7.0 mm Airway Equipment and Method: Stylet Placement Confirmation: ETT inserted through vocal cords under direct vision,  positive ETCO2 and breath sounds checked- equal and bilateral Secured at: 23 cm Tube secured with: Tape Dental Injury: Teeth and Oropharynx as per pre-operative assessment

## 2018-04-24 NOTE — Op Note (Signed)
PATIENT:  Sierra Krueger  PRE-OPERATIVE DIAGNOSIS: Left renal calculi  POST-OPERATIVE DIAGNOSIS: Same  PROCEDURE: 1. Percutaneous nephrostomy sheath placement. 2.  Right/left percutaneous nephrolithotomy (2.9 cm.) 3. Antegrade double-J stent placement. 4.  Fluoroscopy greater than 1 hour less than 2 hours  SURGEON:  Claybon Jabs  INDICATION: Sierra Krueger is a 62 year old female with bilateral renal calculi.  Her 2 large stones were on the left-hand side and she had been experiencing back pain.  We discussed management of the stones and she elected to proceed with PCNL due to the volume of the stones.  Since she had a greater volume on the left-hand side I proceeded with that side first.  ANESTHESIA:  General  EBL: 50 cc  DRAINS: 6 French, 24 cm double-J stent in the left ureter  LOCAL MEDICATIONS USED:  None  SPECIMEN: Stone given to patient  Description of procedure: After informed consent the patient was taken to the operating room and administered general endotracheal anesthesia. Once fully anesthetized the patient had an 84 French Foley catheter placed It was then moved from the stretcher onto the operating room table in a prone position with bony prominences padded and chest pads in place. The flank with exiting nephrostomy catheter was then sterilely prepped and draped in standard fashion. An official timeout was then performed.  Using the existing nephrostomy catheter access I passed a 0.038 inch floppy tipped guidewire down the ureter into the bladder under fluoroscopy.  This was left in place and the nephrostomy catheter was removed.  A transverse incision was made over the guidewire and a peel-away coaxial catheter was then passed over the guidewire and down the ureter under fluoroscopy.  The inner portion of the coaxial system was then removed and a second guidewire was passed through this catheter and down the ureter into the bladder under fluoroscopy.  The coaxial catheter was  then removed and one of the guidewires was secured to the drape as a safety guidewire and the second guidewire was used as a working guidewire.  The NephroMax nephrostomy dilating balloon was then passed over the working guidewire into the area of the renal pelvis under fluoroscopy.  It was then inflated using dilute contrast under fluoroscopy until the balloon was fully inflated.  I then passed the 28 French nephrostomy access sheath over the balloon into the area of the renal pelvis under fluoroscopy and then deflated the balloon and removed the dilating balloon.  The 34 French rigid nephroscope was then passed under direct vision through the nephrostomy access sheath.  Clotted blood was evacuated and the stone in the lower pole calyx was identified.  I used 2 prong graspers to extract the stone.  I then advanced the nephroscope into the area of the renal pelvis and identified a second stone which I was able to grasp and extract.  I then began to search for the stone in the parallel lower pole calyx.  I could not see it with the rigid scope so I switched to the flexible cystoscope and was able to identify the stone.  I tried engaging it with a 0 tip nitinol basket but it was too large.  Various maneuvers were undertaken to try to either engage the stone in the basket or to get it into a position where I could use a laser but again due to its location I was unable to get the tip of the laser fiber in contact with the stone.  I switched back and forth multiple times  between the rigid and flexible scopes and eventually noted the stone had become dislodged from the lower pole calyx and had moved into the area of the renal pelvis.  I was therefore able to use the Swiss lithoclast and fragment the stone and extracted all of the portions with the 2 prong graspers.  The remaining small fragments were vacuumed with the ultrasound setting of the Swiss lithoclast.  At the end of the procedure there were some clots  present that could potentially have obscured a stone that I could not identify any stones remaining.  I therefore elected to proceed without a nephrostomy tube due to the minimal bleeding that occurred during the case.  I backloaded the rigid nephroscope over the working guidewire and then passed the double-J stent over the guidewire into the area of the bladder.  As I began to remove the guidewire I noted good curl in the bladder and good curl was noted in the area of the renal pelvis as well.  I removed the safety guidewire and then measured the distance to the level of the renal parenchyma and marked this on the laparoscopic introducer.  I then injected FloSeal through the laparoscopic introducer starting at the level marked in order to place FloSeal in the area of the renal parenchyma and then backed this out slightly filling the tract with FloSeal as well.  With the sheath removed there was no bleeding noted.  I then closed the skin with a running subcuticular 4-0 Monocryl suture and 4 x 4's as well as a Tegaderm were applied.  The patient was then awakened and taken to the recovery room in stable and satisfactory condition.  She tolerated the procedure well with no intraoperative complications.    PLAN OF CARE: Discharge to home after an overnight stay.  PATIENT DISPOSITION:  PACU - hemodynamically stable.

## 2018-04-25 ENCOUNTER — Encounter (HOSPITAL_COMMUNITY): Payer: Self-pay | Admitting: Urology

## 2018-04-25 DIAGNOSIS — N200 Calculus of kidney: Secondary | ICD-10-CM | POA: Diagnosis not present

## 2018-04-25 MED ORDER — OXYCODONE-ACETAMINOPHEN 10-325 MG PO TABS: 1.0000 | ORAL_TABLET | ORAL | 0 refills | Status: DC | PRN

## 2018-04-25 NOTE — Discharge Summary (Signed)
Physician Discharge Summary      Patient ID: Sierra Krueger MRN: [id number] DOB/AGE: 1955-12-28 62 y.o.  Admit date: 04/24/2018 Discharge date: 04/25/2018  Admission Diagnoses: LEFT RENAL CALCULI  Discharge Diagnoses:  Active Problems:   Renal calculus, left   Discharged Condition: good  Hospital Course: She was admitted for elective left PCNL which was undertaken on 13/24/4010 without complication and minimal bleeding therefore it was performed without the need for a nephrostomy tube.  A stent was left indwelling.  I was able to access and remove all visible stones although some small fragments may have persisted as there were some blood clots present. Postoperatively she did well.  She was having minimal pain and it was controlled with oral medications.  Her Foley catheter was draining clear and her incision was dry and intact.  The following morning she was doing well with minimal discomfort other than from the Foley catheter which was removed.  Her urine was clear.  She  denied nausea and has tolerated a regular diet and therefore is felt ready for discharge at this time.  She will follow-up with me as an outpatient and be scheduled for management of her right renal calculus with a KUB at that time to assess any residual fragments.  Discharge Exam: Blood pressure 129/79, pulse (!) 104, temperature 99.3 F (37.4 C), temperature source Oral, resp. rate 12, height 5\' 5"  (1.651 m), weight 73 kg, SpO2 97 %. General: Awake, alert and in no apparent distress. Chest: Normal respiratory effort. Cardiovascular: Regular rate and rhythm. Abdomen: Soft, nontender, nondistended. Flank: Dressing in place.  It is dry and intact.  Disposition: Discharge disposition: 01-Home or Self Care        Allergies as of 04/25/2018      Reactions   Codeine Hives   Morphine And Related Itching, Nausea And Vomiting   Shellfish Allergy Hives, Swelling      Medication List    STOP taking these  medications   pantoprazole 40 MG tablet Commonly known as:  PROTONIX     TAKE these medications   amLODipine 2.5 MG tablet Commonly known as:  NORVASC Take 2.5 mg by mouth daily.   aspirin 325 MG tablet Take 325 mg by mouth daily.   cetirizine 10 MG tablet Commonly known as:  ZYRTEC Take 10 mg by mouth daily as needed for allergies.   diclofenac sodium 1 % Gel Commonly known as:  VOLTAREN Apply 1 application topically 4 (four) times daily as needed for pain.   fluticasone 50 MCG/ACT nasal spray Commonly known as:  FLONASE Place 1 spray into both nostrils daily as needed for allergies or rhinitis.   HYDROcodone-acetaminophen 5-325 MG tablet Commonly known as:  NORCO/VICODIN Take 1 tablet by mouth 4 (four) times daily as needed for moderate pain.   meloxicam 15 MG tablet Commonly known as:  MOBIC TAKE 1 TABLET(15 MG) BY MOUTH DAILY What changed:  See the new instructions.   mirtazapine 15 MG tablet Commonly known as:  REMERON Take 15 mg by mouth every evening.   multivitamin tablet Take 1 tablet by mouth daily.   oxyCODONE-acetaminophen 10-325 MG tablet Commonly known as:  PERCOCET Take 1-2 tablets by mouth every 4 (four) hours as needed for pain. Maximum dose per 24 hours - 8 pills   tiZANidine 4 MG tablet Commonly known as:  ZANAFLEX Take 4 mg by mouth 2 (two) times daily as needed for muscle spasms.   zolpidem 10 MG tablet Commonly known as:  AMBIEN  Take 10 mg by mouth at bedtime as needed for sleep.      Follow-up Information    [facility] On 05/01/2018.   Why:  For your appiontment at 8:15 Contact information: [place]          Signed: Claybon Jabs 04/25/2018, 7:00 AM

## 2018-05-09 ENCOUNTER — Encounter (HOSPITAL_BASED_OUTPATIENT_CLINIC_OR_DEPARTMENT_OTHER): Payer: Self-pay | Admitting: *Deleted

## 2018-05-09 ENCOUNTER — Other Ambulatory Visit: Payer: Self-pay | Admitting: Urology

## 2018-05-10 ENCOUNTER — Encounter (HOSPITAL_BASED_OUTPATIENT_CLINIC_OR_DEPARTMENT_OTHER): Payer: Self-pay | Admitting: *Deleted

## 2018-05-10 NOTE — Progress Notes (Signed)
Spoke w/ pt via phone for pre-op interview.  Npo after mn.  Arrive at 0530.  Need istat.  Current ekg in chart and epic.  Will take protonix, norvasc, and if needed take oxycodone am dos w/ sips of water.

## 2018-05-15 ENCOUNTER — Ambulatory Visit (INDEPENDENT_AMBULATORY_CARE_PROVIDER_SITE_OTHER): Payer: Medicare Other | Admitting: Specialist

## 2018-05-15 ENCOUNTER — Ambulatory Visit (HOSPITAL_BASED_OUTPATIENT_CLINIC_OR_DEPARTMENT_OTHER): Payer: Medicare Other | Admitting: Anesthesiology

## 2018-05-15 ENCOUNTER — Other Ambulatory Visit: Payer: Self-pay

## 2018-05-15 ENCOUNTER — Encounter (HOSPITAL_BASED_OUTPATIENT_CLINIC_OR_DEPARTMENT_OTHER)
Admission: RE | Disposition: A | Discharge status: Home / Self Care | Payer: Self-pay | Source: Home / Self Care | Attending: Urology

## 2018-05-15 ENCOUNTER — Encounter (HOSPITAL_BASED_OUTPATIENT_CLINIC_OR_DEPARTMENT_OTHER): Payer: Self-pay | Admitting: *Deleted

## 2018-05-15 ENCOUNTER — Ambulatory Visit (HOSPITAL_BASED_OUTPATIENT_CLINIC_OR_DEPARTMENT_OTHER)
Admission: RE | Admit: 2018-05-15 | Discharge: 2018-05-15 | Disposition: A | Discharge status: Home / Self Care | Payer: Medicare Other | Attending: Urology | Admitting: Urology

## 2018-05-15 DIAGNOSIS — Z87891 Personal history of nicotine dependence: Secondary | ICD-10-CM | POA: Insufficient documentation

## 2018-05-15 DIAGNOSIS — F419 Anxiety disorder, unspecified: Secondary | ICD-10-CM | POA: Diagnosis not present

## 2018-05-15 DIAGNOSIS — Z91018 Allergy to other foods: Secondary | ICD-10-CM | POA: Diagnosis not present

## 2018-05-15 DIAGNOSIS — F329 Major depressive disorder, single episode, unspecified: Secondary | ICD-10-CM | POA: Diagnosis not present

## 2018-05-15 DIAGNOSIS — Z8673 Personal history of transient ischemic attack (TIA), and cerebral infarction without residual deficits: Secondary | ICD-10-CM | POA: Diagnosis not present

## 2018-05-15 DIAGNOSIS — K219 Gastro-esophageal reflux disease without esophagitis: Secondary | ICD-10-CM | POA: Diagnosis not present

## 2018-05-15 DIAGNOSIS — I10 Essential (primary) hypertension: Secondary | ICD-10-CM | POA: Insufficient documentation

## 2018-05-15 DIAGNOSIS — Z79899 Other long term (current) drug therapy: Secondary | ICD-10-CM | POA: Diagnosis not present

## 2018-05-15 DIAGNOSIS — Z91013 Allergy to seafood: Secondary | ICD-10-CM | POA: Insufficient documentation

## 2018-05-15 DIAGNOSIS — G4700 Insomnia, unspecified: Secondary | ICD-10-CM | POA: Insufficient documentation

## 2018-05-15 DIAGNOSIS — N200 Calculus of kidney: Secondary | ICD-10-CM | POA: Diagnosis not present

## 2018-05-15 DIAGNOSIS — Z885 Allergy status to narcotic agent status: Secondary | ICD-10-CM | POA: Diagnosis not present

## 2018-05-15 DIAGNOSIS — Z91048 Other nonmedicinal substance allergy status: Secondary | ICD-10-CM | POA: Insufficient documentation

## 2018-05-15 HISTORY — DX: Unspecified sequelae of cerebral infarction: I69.30

## 2018-05-15 HISTORY — DX: Chronic kidney disease, stage 2 (mild): N18.2

## 2018-05-15 HISTORY — PX: CYSTOSCOPY/URETEROSCOPY/HOLMIUM LASER/STENT PLACEMENT: SHX6546

## 2018-05-15 HISTORY — DX: Essential (primary) hypertension: I10

## 2018-05-15 HISTORY — DX: Presence of dental prosthetic device (complete) (partial): Z97.2

## 2018-05-15 HISTORY — PX: HOLMIUM LASER APPLICATION: SHX5852

## 2018-05-15 HISTORY — DX: Calculus of kidney: N20.0

## 2018-05-15 LAB — POCT I-STAT 4, (NA,K, GLUC, HGB,HCT)
Glucose, Bld: 97 mg/dL (ref 70–99)
HCT: 34 % — ABNORMAL LOW (ref 36.0–46.0)
Hemoglobin: 11.6 g/dL — ABNORMAL LOW (ref 12.0–15.0)
Potassium: 4.2 mmol/L (ref 3.5–5.1)
Sodium: 140 mmol/L (ref 135–145)

## 2018-05-15 SURGERY — CYSTOSCOPY/URETEROSCOPY/HOLMIUM LASER/STENT PLACEMENT
Anesthesia: General | Site: Renal | Laterality: Bilateral

## 2018-05-15 MED ORDER — PROMETHAZINE HCL 25 MG/ML IJ SOLN
6.2500 mg | INTRAMUSCULAR | Status: DC | PRN
  Filled 2018-05-15: qty 1

## 2018-05-15 MED ORDER — LACTATED RINGERS IV SOLN
INTRAVENOUS | Status: DC
  Administered 2018-05-15 (×2): via INTRAVENOUS
  Filled 2018-05-15: qty 1000

## 2018-05-15 MED ORDER — KETOROLAC TROMETHAMINE 30 MG/ML IJ SOLN
INTRAMUSCULAR | Status: AC
  Filled 2018-05-15: qty 1

## 2018-05-15 MED ORDER — FENTANYL CITRATE (PF) 100 MCG/2ML IJ SOLN
25.0000 ug | INTRAMUSCULAR | Status: DC | PRN
  Filled 2018-05-15: qty 1

## 2018-05-15 MED ORDER — TRAMADOL HCL 50 MG PO TABS
ORAL_TABLET | ORAL | Status: AC
  Filled 2018-05-15: qty 1

## 2018-05-15 MED ORDER — LIDOCAINE HCL 1 % IJ SOLN
INTRAMUSCULAR | Status: DC | PRN
  Administered 2018-05-15: 30 mg via INTRADERMAL

## 2018-05-15 MED ORDER — PHENAZOPYRIDINE HCL 200 MG PO TABS
200.0000 mg | ORAL_TABLET | Freq: Once | ORAL | Status: DC
  Filled 2018-05-15: qty 1

## 2018-05-15 MED ORDER — FENTANYL CITRATE (PF) 100 MCG/2ML IJ SOLN
INTRAMUSCULAR | Status: AC
  Filled 2018-05-15: qty 2

## 2018-05-15 MED ORDER — TRAMADOL HCL 50 MG PO TABS: 50.0000 mg | ORAL_TABLET | Freq: Four times a day (QID) | ORAL | 0 refills | Status: DC | PRN

## 2018-05-15 MED ORDER — PHENAZOPYRIDINE HCL 200 MG PO TABS: 200.0000 mg | ORAL_TABLET | Freq: Three times a day (TID) | ORAL | 0 refills | Status: DC | PRN

## 2018-05-15 MED ORDER — MIDAZOLAM HCL 2 MG/2ML IJ SOLN
INTRAMUSCULAR | Status: AC
  Filled 2018-05-15: qty 2

## 2018-05-15 MED ORDER — DEXAMETHASONE SODIUM PHOSPHATE 10 MG/ML IJ SOLN
INTRAMUSCULAR | Status: AC
  Filled 2018-05-15: qty 1

## 2018-05-15 MED ORDER — FENTANYL CITRATE (PF) 100 MCG/2ML IJ SOLN
INTRAMUSCULAR | Status: DC | PRN
  Administered 2018-05-15: 50 ug via INTRAVENOUS
  Administered 2018-05-15 (×6): 25 ug via INTRAVENOUS

## 2018-05-15 MED ORDER — PROPOFOL 10 MG/ML IV BOLUS
INTRAVENOUS | Status: AC
  Filled 2018-05-15: qty 40

## 2018-05-15 MED ORDER — ONDANSETRON HCL 4 MG/2ML IJ SOLN
INTRAMUSCULAR | Status: AC
  Filled 2018-05-15: qty 2

## 2018-05-15 MED ORDER — DEXAMETHASONE SODIUM PHOSPHATE 10 MG/ML IJ SOLN
INTRAMUSCULAR | Status: DC | PRN
  Administered 2018-05-15: 4 mg via INTRAVENOUS

## 2018-05-15 MED ORDER — LIDOCAINE 2% (20 MG/ML) 5 ML SYRINGE
INTRAMUSCULAR | Status: AC
  Filled 2018-05-15: qty 5

## 2018-05-15 MED ORDER — CEFAZOLIN SODIUM-DEXTROSE 2-4 GM/100ML-% IV SOLN
INTRAVENOUS | Status: AC
  Filled 2018-05-15: qty 100

## 2018-05-15 MED ORDER — ONDANSETRON HCL 4 MG/2ML IJ SOLN
INTRAMUSCULAR | Status: DC | PRN
  Administered 2018-05-15: 4 mg via INTRAVENOUS

## 2018-05-15 MED ORDER — MIDAZOLAM HCL 5 MG/5ML IJ SOLN
INTRAMUSCULAR | Status: DC | PRN
  Administered 2018-05-15: 1 mg via INTRAVENOUS

## 2018-05-15 MED ORDER — SODIUM CHLORIDE 0.9 % IR SOLN
Status: DC | PRN
  Administered 2018-05-15: 3000 mL

## 2018-05-15 MED ORDER — KETOROLAC TROMETHAMINE 30 MG/ML IJ SOLN
30.0000 mg | Freq: Once | INTRAMUSCULAR | Status: AC | PRN
  Administered 2018-05-15: 30 mg via INTRAVENOUS
  Filled 2018-05-15: qty 1

## 2018-05-15 MED ORDER — CEFAZOLIN SODIUM-DEXTROSE 2-4 GM/100ML-% IV SOLN
2.0000 g | Freq: Once | INTRAVENOUS | Status: AC
  Administered 2018-05-15: 2 g via INTRAVENOUS
  Filled 2018-05-15: qty 100

## 2018-05-15 MED ORDER — TRAMADOL HCL 50 MG PO TABS
50.0000 mg | ORAL_TABLET | Freq: Once | ORAL | Status: AC
  Administered 2018-05-15: 50 mg via ORAL
  Filled 2018-05-15: qty 1

## 2018-05-15 MED ORDER — IOHEXOL 300 MG/ML  SOLN
INTRAMUSCULAR | Status: DC | PRN
  Administered 2018-05-15: 24 mL

## 2018-05-15 MED ORDER — PROPOFOL 10 MG/ML IV BOLUS
INTRAVENOUS | Status: DC | PRN
  Administered 2018-05-15: 100 mg via INTRAVENOUS

## 2018-05-15 SURGICAL SUPPLY — 29 items
BAG DRAIN URO-CYSTO SKYTR STRL (DRAIN) ×2 IMPLANT
BAG DRN UROCATH (DRAIN) ×1
BASKET ZERO TIP NITINOL 2.4FR (BASKET) ×1 IMPLANT
BSKT STON RTRVL ZERO TP 2.4FR (BASKET) ×1
CATH INTERMIT  6FR 70CM (CATHETERS) ×1 IMPLANT
CATH URET 5FR 28IN CONE TIP (BALLOONS)
CATH URET 5FR 70CM CONE TIP (BALLOONS) IMPLANT
CLOTH BEACON ORANGE TIMEOUT ST (SAFETY) ×2 IMPLANT
EXTRACTOR STONE 1.7FRX115CM (UROLOGICAL SUPPLIES) ×1 IMPLANT
FIBER LASER FLEXIVA 365 (UROLOGICAL SUPPLIES) IMPLANT
FIBER LASER TRAC TIP (UROLOGICAL SUPPLIES) ×1 IMPLANT
GLOVE BIO SURGEON STRL SZ 6.5 (GLOVE) ×1 IMPLANT
GLOVE BIO SURGEON STRL SZ8 (GLOVE) ×2 IMPLANT
GLOVE BIOGEL PI IND STRL 6.5 (GLOVE) IMPLANT
GLOVE BIOGEL PI INDICATOR 6.5 (GLOVE) ×2
GOWN STRL REUS W/TWL XL LVL3 (GOWN DISPOSABLE) ×3 IMPLANT
GUIDEWIRE ANG ZIPWIRE 038X150 (WIRE) IMPLANT
GUIDEWIRE STR DUAL SENSOR (WIRE) ×3 IMPLANT
INFUSOR MANOMETER BAG 3000ML (MISCELLANEOUS) ×1 IMPLANT
IV NS IRRIG 3000ML ARTHROMATIC (IV SOLUTION) ×4 IMPLANT
KIT TURNOVER CYSTO (KITS) ×2 IMPLANT
MANIFOLD NEPTUNE II (INSTRUMENTS) ×1 IMPLANT
NS IRRIG 500ML POUR BTL (IV SOLUTION) ×2 IMPLANT
PACK CYSTO (CUSTOM PROCEDURE TRAY) ×2 IMPLANT
SHEATH URET ACCESS 12FR/35CM (UROLOGICAL SUPPLIES) ×1 IMPLANT
STENT URET 6FRX24 CONTOUR (STENTS) ×2 IMPLANT
TUBE CONNECTING 12X1/4 (SUCTIONS) ×1 IMPLANT
TUBING UROLOGY SET (TUBING) ×1 IMPLANT
WATER STERILE IRR 3000ML UROMA (IV SOLUTION) IMPLANT

## 2018-05-15 NOTE — H&P (Signed)
HPI: Sierra Krueger is a 63 year-old female with bilateral renal calculi.  01/29/18: Returns today for KUB and follow-up post left-sided PCNL. She's continued to have intermittent left sided pain but mostly with movement. She's been using Percocet for this. Denies fevers, n/v. Denies painful voiding or persistent gross hematuria. She states not seeing blood in her urine since the procedure itself. She's tolerated her indwelling left ureteral stent ok otherwise with only mildly bothersome increased frequency/urgency.   03/14/18: She underwent a CT scan on 01/20/18 that revealed 2 large left renal calculi (17 mm in the lower pole, 1100 HU and 12 mm in renal pelvis, 700 HU) and a large right renal pelvic calculus measuring 9 mm with Hounsfield units of 1100 and a smaller 3 mm lower pole stone.  She reports that in 7/19 after a long trade ride she began experiencing back pain. Her back pain is worsened by bending movements. It is constant. It is not localized to 1 side or the other such as the flank. She does have a history of prior back surgery. She has no prior history stones. She has not experienced any hematuria and has no history of UTIs.    The stone was on the left side. She had Stent and PCNL for treatment of her renal calculi. Patient denies Ureteroscopy and ESWL. This procedure was done 04/24/2018. She does have a stent in place.   She is currently having back pain. She denies having flank pain, groin pain, nausea, vomiting, fever, and chills.   She does not have dysuria. She does have urgency. She does have frequency.     ALLERGIES: Animal dander - Dogs Codeine Derivatives Fresh Fruit Morphine Sulfate TABS Shellfish-derived Products     MEDICATIONS: AmLODIPine Besylate 2.5 MG Oral Tablet Oral  ClonazePAM 1 MG Oral Tablet Oral  Flexeril 10 MG TABS Oral  Mirtazapine  Nucynta  Pantoprazole Sodium  Tizanidine Hcl  TraMADol HCl - 50 MG Oral Tablet Oral  Zolpidem Tartrate  Zolpidem  Tartrate 5 MG Oral Tablet Oral     GU PSH: No GU PSH      PSH Notes: Back Surgery, Foot Surgery, Breast Surgery Lumpectomy   NON-GU PSH: Back surgery Foot surgery (unspecified) Remove Breast Lesion - 2013    GU PMH: Renal calculus, Bilateral, Because of obstruction, larger stone burden and what appears to be some chronic obstructive changes of the kidney on the left-hand side I have recommended proceeding with left PCNL. I told her that I thought there was a very low likelihood that the pain she was experiencing in her back was from her stones although it is remotely possible however unlikely. She understands this. Has elected to proceed with surgery. - 03/14/2018      PMH Notes: .   NON-GU PMH: Hypercalcemia, She was found to have hypercalcemia. Because of this I am going to blood work to rule out hyperparathyroidism. He - 03/14/2018 Cerebral infarction, unspecified, Stroke Syndrome - 2014 Personal history of other diseases of the circulatory system, History of hypertension - 2014 Personal history of other specified conditions, History of insomnia - 2014 Anxiety Depression GERD Hypertension    FAMILY HISTORY: No Family History    SOCIAL HISTORY: Marital Status: Divorced Preferred Language: English; Ethnicity: Not Hispanic Or Latino; Race: Black or African American Current Smoking Status: Patient does not smoke anymore.   Tobacco Use Assessment Completed: Used Tobacco in last 30 days? Has never drank.  Drinks 1 caffeinated drink per day.  Notes: Caffeine Use, Alcohol Use   REVIEW OF SYSTEMS:    GU Review Female:   Patient denies frequent urination, hard to postpone urination, burning /pain with urination, get up at night to urinate, leakage of urine, stream starts and stops, trouble starting your stream, have to strain to urinate, and being pregnant.  Gastrointestinal (Upper):   Patient reports nausea. Patient denies vomiting and indigestion/ heartburn.  Gastrointestinal  (Lower):   Patient denies diarrhea and constipation.  Constitutional:   Patient reports night sweats. Patient denies fever, weight loss, and fatigue.  Skin:   Patient denies skin rash/ lesion and itching.  Eyes:   Patient denies blurred vision and double vision.  Ears/ Nose/ Throat:   Patient reports sore throat. Patient denies sinus problems.  Hematologic/Lymphatic:   Patient denies easy bruising and swollen glands.  Cardiovascular:   Patient denies leg swelling and chest pains.  Respiratory:   Patient denies cough and shortness of breath.  Endocrine:   Patient denies excessive thirst.  Musculoskeletal:   Patient reports back pain. Patient denies joint pain.  Neurological:   Patient denies headaches and dizziness.  Psychologic:   Patient reports depression and anxiety.    VITAL SIGNS:    BP 141/90 mmHg  Pulse 1112 /min  Temperature 97.9 F / 36.6 C   MULTI-SYSTEM PHYSICAL EXAMINATION:    Constitutional: Well-nourished. No physical deformities. Normally developed. Good grooming.  Neck: Neck symmetrical, not swollen. Normal tracheal position.  Respiratory: No labored breathing, no use of accessory muscles.   Cardiovascular: Normal temperature, normal extremity pulses, no swelling, no varicosities.  Skin: No paleness, no jaundice, no cyanosis. No lesion, no ulcer, no rash.  Neurologic / Psychiatric: Oriented to time, oriented to place, oriented to person. No depression, no anxiety, no agitation.  Gastrointestinal: No mass, no tenderness, no rigidity, non obese abdomen. NO CVAT, flank tenderness. Well healed nephrostomy catheter site.  Musculoskeletal: Normal gait and station of head and neck.     PAST DATA REVIEWED:  Source Of History:  Patient  Records Review:   Previous Hospital Records, Previous Patient Records  Urine Test Review:   Urinalysis  X-Ray Review: KUB: Reviewed Films. Discussed With Patient.      PROCEDURES:         KUB - [id number]  A single view of the abdomen is  obtained. An 26mm LLP calculi is identified. I can't exclude a few small non-obstructicing calculi adjacent to this and in the mid-pole region. Her right sided stone burden is poorly visualized today but remains grossly unchanged compared to previous imaging studies. No obvious ureteral calculi identified today. Stable pelvic phlebolith noted. Bowel gas pattern within normal limits.  Ureteral Stent:  In position ureteral stent. Left ureteral stent.               Urinalysis w/Scope Dipstick Dipstick Cont'd Micro  Color: Yellow Bilirubin: Neg mg/dL WBC/hpf: 6 - 10/hpf  Appearance: Cloudy Ketones: Neg mg/dL RBC/hpf: >60/hpf  Specific Gravity: 1.020 Blood: 3+ ery/uL Bacteria: Rare (0-9/hpf)  pH: 5.5 Protein: 2+ mg/dL Cystals: NS (Not Seen)  Glucose: Neg mg/dL Urobilinogen: 1.0 mg/dL Casts: Hyaline    Nitrites: Neg Trichomonas: Not Present    Leukocyte Esterase: 3+ leu/uL Mucous: Present      Epithelial Cells: NS (Not Seen)      Yeast: NS (Not Seen)      Sperm: Not Present     Impression: Residual left renal calculi with stent in place and right renal pelvis stone.  PLAN:  She has some residual stone in the left kidney after her PCNL due to access through a parallel calyx.  Her stent is in good position and she has a stone in the renal pelvis on the right-hand side as well.  My plan is to address her right renal pelvic stone and also will plan to treat the residual stone on the left-hand side as well.

## 2018-05-15 NOTE — Op Note (Signed)
PATIENT:  Sierra Krueger  PRE-OPERATIVE DIAGNOSIS: Bilateral renal calculi  POST-OPERATIVE DIAGNOSIS: Same  PROCEDURE:  1. Cystoscopy with bilateral retrograde pyelograms including interpretation. 2. Right ureteroscopy, laser lithotripsy, stone extraction and right double-J stent placement. 3. Left ureteral stent removal. 4. Left ureteroscopy, laser lithotripsy, stone extraction and left double-J stent placement 5. Fluoroscopy time greater than 1 hour and less than 2 hours  SURGEON: Claybon Jabs, MD  INDICATION: Ms. Wiersma is a 63 year old female who had a stone in the right renal pelvis and a large stone burden within the left kidney.  She underwent a left PCNL and fragments remained within the left kidney.  A stent was placed on the left-hand side.  She returns today for left ureteroscopy and treatment of her residual left renal fragments and right ureteroscopy and laser lithotripsy of her right renal pelvic stone.  ANESTHESIA:  General  EBL:  Minimal  DRAINS: 6 French, 24 cm double-J stents in the right and left ureters (with string)  SPECIMEN: Stone given to patient  DESCRIPTION OF PROCEDURE: The patient was taken to the major OR and placed on the table. General anesthesia was administered and then the patient was moved to the dorsal lithotomy position. The genitalia was sterilely prepped and draped. An official timeout was performed.  Initially the 57 French cystoscope with 30 lens was passed under direct vision into the bladder. The bladder was then fully inspected. It was noted be free of any tumors, stones or inflammatory lesions. Ureteral orifices were of normal configuration and position with a stent exiting the left ureteral orifice.  I passed a 6 [place] open-ended ureteral catheter through the cystoscope and into the right ureteral orifice.  A retrograde pyelogram was performed by injecting full-strength contrast up the right ureter under direct fluoroscopic control. It  revealed a filling defect in the renal pelvis consistent with the stone seen on the preoperative imaging. The remainder of the ureter was noted to be normal as was the intrarenal collecting system. I then passed a 0.038 inch floppy-tipped guidewire through the open ended catheter and into the area of the renal pelvis and this was left in place.  A dual-lumen ureteral catheter was then passed over the guidewire and a second safety guidewire was then placed.  The dual-lumen catheter was removed and I passed a ureteral access sheath over the working guidewire into the area of the UPJ and remove the inner portion of the access sheath as well as guidewire.  I then proceeded with ureteroscopy.  A 6 [place] digital, flexible ureteroscope was then passed up the access sheath .The stone in the renal pelvis was identified and I felt it was too large to extract and therefore elected to proceed with laser lithotripsy. The 200  holmium laser fiber was used to fragment the stone. I then used the escape basket to extract all of the stone fragments and reinspection of the upper, middle and lower pole calyces ureteroscopically revealed no further stone fragments and no injury to the collecting system.  I left the safety guidewire in place and then proceeded to address the left side.  The cystoscope was inserted in the bladder and an alligator forceps was used to grasp the stent exiting the left ureteral orifice and this was withdrawn through the urethral meatus.  I then passed a guidewire through the stent and into the area of the renal pelvis under fluoroscopy.  I then passed a 6 [place] open-ended ureteral catheter through the cystoscope and into the  left ureter and injected full-strength Omnipaque contrast to perform a left retrograde pyelogram which revealed a normal-appearing collecting system with what appeared to be a filling defect in the lower pole calyx consistent with preoperative imaging.  A second guidewire was then  passed through the open-ended catheter and into the area of the renal pelvis and the open-ended catheter was removed.  I again used the ureteral access sheath and passed this over the working guidewire and into the area of the renal pelvis.  The flexible ureteroscope was then passed through the access sheath and I started inspecting each calyx.  I found several small stone fragments and engaged these with the basket and remove them easily.  I then was able to identify the large stone in the lower pole calyx.  I was able to engage this in the basket and moved this into the renal pelvic region where I was able to fragment the stone and removed pieces.  The remaining stone was then "dusted".  I removed the access sheath and then placed stents bilaterally.   I then backloaded the cystoscope over the guidewire and passed the stent over the guidewire into the area of the renal pelvis on the left side. As the guidewire was removed good curl was noted in the renal pelvis.  The same procedure was performed on the right-hand side with good curl noted in the renal pelvis on that side as well.  The bladder was drained and the cystoscope was then removed.  The strings which were affixed to the distal aspect of the stents were then tucked into the vagina.  The patient tolerated the procedure well no intraoperative complications.  PLAN OF CARE: Discharge to home after PACU  PATIENT DISPOSITION:  PACU - hemodynamically stable.

## 2018-05-15 NOTE — Discharge Instructions (Signed)
Post stone removal/stent placement surgery instructions ° ° °Definitions: ° °Ureter: The duct that transports urine from the kidney to the bladder. °Stent: A plastic hollow tube that is placed into the ureter, from the kidney to the bladder to prevent the ureter from swelling shut. ° °General instructions: ° °Despite the fact that no skin incisions were used, the area around the ureter and bladder is raw and irritated. The stent is a foreign body which will further irritate the bladder wall. This irritation is manifested by increased frequency of urination, both day and night, and by an increase in the urge to urinate. In some, the urge to urinate is present almost always. Sometimes the urge is strong enough that you may not be able to stop your self from urinating. The only real cure is to remove the stent and then give time for the bladder wall to heal which can't be done until the danger of the ureter swelling shut has passed. (This varies from 2-21 days). ° °You may see some blood in your urine while the stent is in place and a few days afterward. Do not be alarmed, even if the urine is clear for a while. Get off your feet and drink lots of fluids until clearing occurs. If you start to pass clots or don't improve, call us. ° °If you have a string coming from your urethra:  The stent string is attached to your ureteral stent.  Do not pull on thisIf you have a string coming from your urethra:  The stent string is attached to your ureteral stent.  Do not pull on this. ° °Diet: ° °You may return to your normal diet immediately. Because of the raw surface of your bladder, alcohol, spicy foods, foods high in acid and drinks with caffeine may cause irritation or frequency and should be used in moderation. To keep your urine flowing freely and avoid constipation, drink plenty of fluids during the day (8-10 glasses). Tip: Avoid cranberry juice because it is very acidic. ° °Activity: ° °Your physical activity doesn't need  to be restricted. However, if you are very active, you may see some blood in the urine. We suggest that you reduce your activity under the circumstances until the bleeding has stopped. ° °Bowels: ° °It is important to keep your bowels regular during the postoperative period. Straining with bowel movements can cause bleeding. A bowel movement every other day is reasonable. Use a mild laxative if needed, such as milk of magnesia 2-3 tablespoons, or 2 Dulcolax tablets. Call if you continue to have problems. If you had been taking narcotics for pain, before, during or after your surgery, you may be constipated. Take a laxative if necessary. ° ° ° ° °Medication: ° °You should resume your pre-surgery medications unless told not to. DO NOT RESUME YOUR ASPIRIN, or any other medicines like ibuprofen, motrin, excedrin, advil, aleve, vitamin E, fish oil as these can all cause bleeding x 7 days. In addition you may be given an antibiotic to prevent or treat infection. Antibiotics are not always necessary. All medication should be taken as prescribed until the bottles are finished unless you are having an unusual reaction to one of the drugs. ° °Problems you should report to us: ° °a. Fever greater than 101°F. °b. Heavy bleeding, or clots (see notes above about blood in urine). °c. Inability to urinate. °d. Drug reactions (hives, rash, nausea, vomiting, diarrhea). °e. Severe burning or pain with urination that is not improving. ° °Followup: ° °  You will need a followup appointment to monitor your progress in most cases. Please call the office for this appointment when you get home if your appointment has not already been scheduled. Usually the first appointment will be about 5-14 days after your surgery and if you have a stent in place it will likely be removed at that time. ° ° °Post Anesthesia Home Care Instructions ° °Activity: °Get plenty of rest for the remainder of the day. A responsible individual must stay with you for 24  hours following the procedure.  °For the next 24 hours, DO NOT: °-Drive a car °-Operate machinery °-Drink alcoholic beverages °-Take any medication unless instructed by your physician °-Make any legal decisions or sign important papers. ° °Meals: °Start with liquid foods such as gelatin or soup. Progress to regular foods as tolerated. Avoid greasy, spicy, heavy foods. If nausea and/or vomiting occur, drink only clear liquids until the nausea and/or vomiting subsides. Call your physician if vomiting continues. ° °Special Instructions/Symptoms: °Your throat may feel dry or sore from the anesthesia or the breathing tube placed in your throat during surgery. If this causes discomfort, gargle with warm salt water. The discomfort should disappear within 24 hours. ° ° °

## 2018-05-15 NOTE — Progress Notes (Signed)
Repeat vitals   bp  115/69    p 96 sao2   100      t 99.4

## 2018-05-15 NOTE — Anesthesia Postprocedure Evaluation (Signed)
Anesthesia Post Note  Patient: Sierra Krueger  Procedure(Krueger) Performed: CYSTOSCOPY/RETROGRADE/URETEROSCOPY/HOLMIUM LASER/BASKET STONE EXTRACTION,  LEFT STENT REPLACEMENT, RIGHT STENT PLACEMENT (Bilateral Renal) HOLMIUM LASER APPLICATION (Bilateral Renal)     Patient location during evaluation: PACU Anesthesia Type: General Level of consciousness: awake and alert Pain management: pain level controlled Vital Signs Assessment: post-procedure vital signs reviewed and stable Respiratory status: spontaneous breathing, nonlabored ventilation, respiratory function stable and patient connected to nasal cannula oxygen Cardiovascular status: blood pressure returned to baseline and stable Postop Assessment: no apparent nausea or vomiting Anesthetic complications: no    Last Vitals:  Vitals:   05/15/18 0942 05/15/18 0945  BP: (!) 144/64 140/79  Pulse: (!) 120 (!) 115  Resp: 16 16  Temp: 36.8 C   SpO2: 100% 100%    Last Pain:  Vitals:   05/15/18 0539  TempSrc: Oral  PainSc:                  Sierra Krueger

## 2018-05-15 NOTE — Anesthesia Procedure Notes (Signed)
Procedure Name: LMA Insertion Date/Time: 05/15/2018 7:36 AM Performed by: Garrel Ridgel, CRNA Pre-anesthesia Checklist: Patient identified, Emergency Drugs available, Suction available, Patient being monitored and Timeout performed Patient Re-evaluated:Patient Re-evaluated prior to induction Oxygen Delivery Method: Circle system utilized Preoxygenation: Pre-oxygenation with 100% oxygen Induction Type: IV induction Ventilation: Mask ventilation without difficulty LMA: LMA inserted LMA Size: 3.0 Number of attempts: 1 Placement Confirmation: positive ETCO2 Tube secured with: Tape Dental Injury: Teeth and Oropharynx as per pre-operative assessment

## 2018-05-15 NOTE — Transfer of Care (Signed)
Immediate Anesthesia Transfer of Care Note  Patient: Sierra Krueger  Procedure(s) Performed: CYSTOSCOPY/RETROGRADE/URETEROSCOPY/HOLMIUM LASER/BASKET STONE EXTRACTION,  LEFT STENT REPLACEMENT, RIGHT STENT PLACEMENT (Bilateral Renal) HOLMIUM LASER APPLICATION (Bilateral Renal)  Patient Location: PACU  Anesthesia Type:General  Level of Consciousness: awake, alert  and oriented  Airway & Oxygen Therapy: Patient Spontanous Breathing and Patient connected to face mask oxygen  Post-op Assessment: Report given to RN and Post -op Vital signs reviewed and stable  Post vital signs: Reviewed and stable  Last Vitals:  Vitals Value Taken Time  BP    Temp    Pulse 117 05/15/2018  9:42 AM  Resp    SpO2 100 % 05/15/2018  9:42 AM  Vitals shown include unvalidated device data.  Last Pain:  Vitals:   05/15/18 0539  TempSrc: Oral  PainSc:       Patients Stated Pain Goal: 6 (24/82/50 0370)  Complications: No apparent anesthesia complications

## 2018-05-15 NOTE — Anesthesia Preprocedure Evaluation (Signed)
Anesthesia Evaluation  Patient identified by MRN, date of birth, ID band Patient awake    Reviewed: Allergy & Precautions, NPO status , Patient's Chart, lab work & pertinent test results  Airway Mallampati: II  TM Distance: >3 FB Neck ROM: Limited    Dental no notable dental hx.    Pulmonary neg pulmonary ROS, former smoker,    Pulmonary exam normal breath sounds clear to auscultation       Cardiovascular hypertension, Normal cardiovascular exam Rhythm:Regular Rate:Normal     Neuro/Psych CVA, Residual Symptoms negative psych ROS   GI/Hepatic negative GI ROS, Neg liver ROS,   Endo/Other  negative endocrine ROS  Renal/GU Renal InsufficiencyRenal disease  negative genitourinary   Musculoskeletal negative musculoskeletal ROS (+)   Abdominal   Peds negative pediatric ROS (+)  Hematology negative hematology ROS (+)   Anesthesia Other Findings   Reproductive/Obstetrics negative OB ROS                             Anesthesia Physical Anesthesia Plan  ASA: III  Anesthesia Plan: General   Post-op Pain Management:    Induction: Intravenous  PONV Risk Score and Plan: 3 and Ondansetron, Dexamethasone and Treatment may vary due to age or medical condition  Airway Management Planned: LMA  Additional Equipment:   Intra-op Plan:   Post-operative Plan: Extubation in OR  Informed Consent: I have reviewed the patients History and Physical, chart, labs and discussed the procedure including the risks, benefits and alternatives for the proposed anesthesia with the patient or authorized representative who has indicated his/her understanding and acceptance.     Dental advisory given  Plan Discussed with: CRNA and Surgeon  Anesthesia Plan Comments:         Anesthesia Quick Evaluation

## 2018-05-16 ENCOUNTER — Encounter (HOSPITAL_BASED_OUTPATIENT_CLINIC_OR_DEPARTMENT_OTHER): Payer: Self-pay | Admitting: Urology

## 2018-08-21 ENCOUNTER — Other Ambulatory Visit: Payer: Self-pay | Admitting: Surgery

## 2018-08-22 ENCOUNTER — Other Ambulatory Visit (HOSPITAL_COMMUNITY): Payer: Self-pay | Admitting: Surgery

## 2018-08-22 ENCOUNTER — Other Ambulatory Visit: Payer: Self-pay | Admitting: Surgery

## 2018-08-22 DIAGNOSIS — Z87442 Personal history of urinary calculi: Secondary | ICD-10-CM

## 2018-10-05 ENCOUNTER — Encounter (HOSPITAL_COMMUNITY)
Admission: RE | Admit: 2018-10-05 | Discharge: 2018-10-05 | Disposition: A | Discharge status: Home / Self Care | Payer: Medicare Other | Source: Ambulatory Visit | Attending: Surgery | Admitting: Surgery

## 2018-10-05 ENCOUNTER — Other Ambulatory Visit: Payer: Self-pay

## 2018-10-05 ENCOUNTER — Ambulatory Visit (HOSPITAL_COMMUNITY)
Admission: RE | Admit: 2018-10-05 | Discharge: 2018-10-05 | Disposition: A | Discharge status: Home / Self Care | Payer: Medicare Other | Source: Ambulatory Visit | Attending: Surgery | Admitting: Surgery

## 2018-10-05 DIAGNOSIS — Z87442 Personal history of urinary calculi: Secondary | ICD-10-CM

## 2018-10-05 MED ORDER — TECHNETIUM TC 99M SESTAMIBI GENERIC - CARDIOLITE
25.0000 | Freq: Once | INTRAVENOUS | Status: AC | PRN
  Administered 2018-10-05: 25 via INTRAVENOUS

## 2018-11-28 ENCOUNTER — Other Ambulatory Visit: Payer: Self-pay | Admitting: Internal Medicine

## 2018-11-28 DIAGNOSIS — Z1231 Encounter for screening mammogram for malignant neoplasm of breast: Secondary | ICD-10-CM

## 2018-12-11 ENCOUNTER — Other Ambulatory Visit: Payer: Self-pay | Admitting: Surgery

## 2018-12-11 DIAGNOSIS — Z87442 Personal history of urinary calculi: Secondary | ICD-10-CM

## 2018-12-18 ENCOUNTER — Other Ambulatory Visit: Payer: Self-pay | Admitting: Surgery

## 2018-12-19 ENCOUNTER — Other Ambulatory Visit: Payer: Medicare Other

## 2018-12-26 ENCOUNTER — Ambulatory Visit
Admission: RE | Admit: 2018-12-26 | Discharge: 2018-12-26 | Disposition: A | Discharge status: Home / Self Care | Payer: Medicare Other | Source: Ambulatory Visit | Attending: Surgery | Admitting: Surgery

## 2018-12-26 MED ORDER — IOPAMIDOL (ISOVUE-300) INJECTION 61%
75.0000 mL | Freq: Once | INTRAVENOUS | Status: AC | PRN
  Administered 2018-12-26: 75 mL via INTRAVENOUS

## 2019-01-11 ENCOUNTER — Ambulatory Visit: Payer: Medicare Other

## 2019-02-26 ENCOUNTER — Ambulatory Visit: Payer: Medicare Other

## 2019-02-28 ENCOUNTER — Inpatient Hospital Stay: Admission: RE | Admit: 2019-02-28 | Payer: Medicare Other | Source: Ambulatory Visit

## 2019-03-20 ENCOUNTER — Ambulatory Visit: Payer: Self-pay | Admitting: Surgery

## 2019-05-09 ENCOUNTER — Ambulatory Visit: Payer: Medicare Other

## 2019-05-22 ENCOUNTER — Inpatient Hospital Stay (HOSPITAL_COMMUNITY): Admission: RE | Admit: 2019-05-22 | Payer: Medicare Other | Source: Ambulatory Visit

## 2019-05-25 ENCOUNTER — Ambulatory Visit: Admit: 2019-05-25 | Payer: Medicare Other | Admitting: Surgery

## 2019-05-25 SURGERY — PARATHYROIDECTOMY
Anesthesia: General

## 2019-05-29 ENCOUNTER — Ambulatory Visit (INDEPENDENT_AMBULATORY_CARE_PROVIDER_SITE_OTHER): Payer: Medicare Other | Admitting: Sports Medicine

## 2019-05-29 ENCOUNTER — Other Ambulatory Visit: Payer: Self-pay

## 2019-05-29 ENCOUNTER — Ambulatory Visit (INDEPENDENT_AMBULATORY_CARE_PROVIDER_SITE_OTHER): Payer: Medicare Other

## 2019-05-29 ENCOUNTER — Encounter: Payer: Self-pay | Admitting: Sports Medicine

## 2019-05-29 ENCOUNTER — Other Ambulatory Visit: Payer: Self-pay | Admitting: Sports Medicine

## 2019-05-29 VITALS — BP 150/90 | HR 103

## 2019-05-29 DIAGNOSIS — M722 Plantar fascial fibromatosis: Secondary | ICD-10-CM

## 2019-05-29 DIAGNOSIS — M79672 Pain in left foot: Secondary | ICD-10-CM

## 2019-05-29 DIAGNOSIS — M729 Fibroblastic disorder, unspecified: Secondary | ICD-10-CM | POA: Diagnosis not present

## 2019-05-29 DIAGNOSIS — G90522 Complex regional pain syndrome I of left lower limb: Secondary | ICD-10-CM

## 2019-05-29 DIAGNOSIS — L905 Scar conditions and fibrosis of skin: Secondary | ICD-10-CM

## 2019-05-29 DIAGNOSIS — M779 Enthesopathy, unspecified: Secondary | ICD-10-CM | POA: Diagnosis not present

## 2019-05-29 DIAGNOSIS — M792 Neuralgia and neuritis, unspecified: Secondary | ICD-10-CM

## 2019-05-29 NOTE — Progress Notes (Signed)
Subjective: Sierra Krueger is a 64 y.o. female patient who presents to office for evaluation of left foot pain. Patient complains of progressive pain especially over the last several years in the left foot at the [place]. Ranks pain 10/10 constant all day and is now interferring with daily activities can't event stand to cook because of pain and has issues with toes sticking up. Patient has tried seeing multiple doctors and is currently on chronic pain meds (oxycodone) without relief and was just started last month on Pregablin without any relief. Patient denies any other pedal complaints. Denies new/recent injury/trip/fall/sprain/any causative factors.   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  Patient Active Problem List   Diagnosis Date Noted  . Renal calculus, left 04/24/2018  . Pain in left hip 08/11/2017  . Trochanteric bursitis, left hip 05/18/2016    Current Outpatient Medications on File Prior to Visit  Medication Sig Dispense Refill  . aspirin 325 MG tablet Take 325 mg by mouth daily.    . cetirizine (ZYRTEC) 10 MG tablet Take 10 mg by mouth daily as needed for allergies.    Marland Kitchen diclofenac sodium (VOLTAREN) 1 % GEL Apply 1 application topically 4 (four) times daily as needed for pain.  5  . fluticasone (FLONASE) 50 MCG/ACT nasal spray Place 1 spray into both nostrils daily as needed for allergies or rhinitis.    . Multiple Vitamin (MULTIVITAMIN) tablet Take 1 tablet by mouth daily.    . pantoprazole (PROTONIX) 40 MG tablet Take 40 mg by mouth 2 (two) times daily.    Marland Kitchen tiZANidine (ZANAFLEX) 4 MG tablet Take 4 mg by mouth 2 (two) times daily as needed for muscle spasms.    Marland Kitchen zolpidem (AMBIEN) 10 MG tablet Take 10 mg by mouth at bedtime as needed for sleep.    Marland Kitchen amLODipine (NORVASC) 5 MG tablet Take 5 mg by mouth daily.    Marland Kitchen lamoTRIgine (LAMICTAL) 25 MG  tablet Take 25 mg by mouth at bedtime.    . lidocaine (LIDODERM) 5 % APPLY 2 TO 2 PATCHES TO PAINFUL AREA OF SKIN FOR 12 HOURS THEN REMOVE FOR 12 HOURS AND REPEAT PROCESS IF TOLERATED    . mirtazapine (REMERON) 30 MG tablet Take 30 mg by mouth at bedtime.    . Oxycodone HCl 10 MG TABS SMARTSIG:1 Tablet(s) By Mouth 2-4 Times Daily PRN    . pregabalin (LYRICA) 25 MG capsule SMARTSIG:1 Capsule(s) By Mouth 2-3 Times Daily PRN    . risperiDONE (RISPERDAL) 0.5 MG tablet Take 0.5 mg by mouth at bedtime.     No current facility-administered medications on file prior to visit.    Allergies  Allergen Reactions  . Codeine Hives  . Morphine And Related Itching and Nausea And Vomiting  . Shellfish Allergy Hives and Swelling    Objective:  General: Alert and oriented x3 in no acute distress  Dermatology: No open lesions bilateral lower extremities, no webspace macerations, no ecchymosis bilateral, Scar dorsal and plantar left foot, all nails x 10 are well manicured.  Vascular: Dorsalis Pedis and Posterior Tibial pedal pulses 1/4 , Capillary Fill Time 5 seconds,(-) pedal hair growth bilateral, very minimal edema bilateral lower extremities, Temperature gradient within normal limits on the right however mildly decreased on the left.  Neurology: Gross sensation intact via light touch bilateral. Hypersensitivity to left foot.  Musculoskeletal:  Diffuse pain all over the Left foot.  Mild elevation and dorsal scar contracture noted over the left second and third metatarsophalangeal joints.  Strength within normal limits in all groups bilateral but guarding on left.   Gait: Antalgic gait  Xrays  Left Foot   Impression: Decrease osseous mineralization, there is evidence of fusion at the second through 4 proximal interphalangeal joints of the left foot, no fracture or dislocation, mild soft tissue swelling, no other acute findings. Assessment and Plan: Problem List Items Addressed This Visit    None     Visit Diagnoses    Left foot pain    -  Primary   Complex regional pain syndrome type 1 of left lower extremity       Relevant Medications   lamoTRIgine (LAMICTAL) 25 MG tablet   mirtazapine (REMERON) 30 MG tablet   Oxycodone HCl 10 MG TABS   pregabalin (LYRICA) 25 MG capsule   risperiDONE (RISPERDAL) 0.5 MG tablet   Tendonitis       Fasciitis       Scar tissue       Neuritis           -Complete examination performed -Xrays reviewed -Discussed treatment options for likely chronic pain of the left foot suspicious of chronic regional pain syndrome with history of multiple surgeries and constant chronic left foot pain -Rx nerve conduction studies for further evaluation of extent of left foot pain and advised patient if this is positive may benefit from referral to pain clinic for placement of neurostimulator -Advised patient that the elevation of her toes is likely secondary to scar tissue and that she may benefit from a toe alignment splint to help stretch her toes at night however there is no guarantee that the position of the toes will change; patient elects at this time to try the toe alignment splint -Recommend good supportive shoes -Continue with pain management follow-up and advised patient if this pain management doctor changes to please notify me or office -Patient to return to office after nerve studies or sooner if condition worsens.  Landis Martins, DPM

## 2019-06-07 ENCOUNTER — Other Ambulatory Visit: Payer: Self-pay

## 2019-06-07 DIAGNOSIS — G90522 Complex regional pain syndrome I of left lower limb: Secondary | ICD-10-CM

## 2019-06-13 NOTE — Patient Instructions (Signed)
DUE TO COVID-19 ONLY ONE VISITOR IS ALLOWED TO COME WITH YOU AND STAY IN THE WAITING ROOM ONLY DURING PRE OP AND PROCEDURE DAY OF SURGERY. THE 1 VISITOR MAY VISIT WITH YOU AFTER SURGERY IN YOUR PRIVATE ROOM DURING VISITING HOURS ONLY!  YOU NEED TO HAVE A COVID 19 TEST ON__2/23/21_____ @_10 :15______, THIS TEST MUST BE DONE BEFORE SURGERY, COME  [place] , [id number].  ([place]) ONCE YOUR COVID TEST IS COMPLETED, PLEASE BEGIN THE QUARANTINE INSTRUCTIONS AS OUTLINED IN YOUR HANDOUT.                Thearsa Granger   Your procedure is scheduled on: 06/22/19   Report to [facility] Main  Entrance   Report to admitting at   7:00 AM     Call this number if you have problems the morning of surgery [phone]    Remember: Do not eat food or drink liquids :After Midnight.   BRUSH YOUR TEETH MORNING OF SURGERY AND RINSE YOUR MOUTH OUT, NO CHEWING GUM CANDY OR MINTS.     Take these medicines the morning of surgery with A SIP OF WATER: Protonix, Lyrica,Amlodipine, Flonase                                 You may not have any metal on your body including hair pins and              piercings  Do not wear jewelry, make-up, lotions, powders or perfumes, deodorant             Do not wear nail polish on your fingernails.  Do not shave  48 hours prior to surgery.             Do not bring valuables to the hospital. [place].  Contacts, dentures or bridgework may not be worn into surgery.  Leave suitcase in the car. After surgery it may be brought to your room.     Patients discharged the day of surgery will not be allowed to drive home.   IF YOU ARE HAVING SURGERY AND GOING HOME THE SAME DAY, YOU MUST HAVE AN ADULT TO DRIVE YOU HOME AND BE WITH YOU FOR 24 HOURS.    YOU MAY GO HOME BY TAXI OR UBER OR ORTHERWISE, BUT AN ADULT MUST ACCOMPANY YOU HOME AND STAY WITH YOU FOR 24 HOURS.  Name and phone number of  your driver:  Special Instructions: N/A              Please read over the following fact sheets you were given: _____________________________________________________________________             [facility] - Preparing for Surgery Before surgery, you can play an important role.   Because skin is not sterile, your skin needs to be as free of germs as possible .  You can reduce the number of germs on your skin by washing with CHG (chlorahexidine gluconate) soap before surgery.   CHG is an antiseptic cleaner which kills germs and bonds with the skin to continue killing germs even after washing. Please DO NOT use if you have an allergy to CHG or antibacterial soaps.   If your skin becomes reddened/irritated stop using the CHG and inform your nurse when you arrive at  Short Stay. Do not shave (including legs and underarms) for at least 48 hours prior to the first CHG shower.    Please follow these instructions carefully:  1.  Shower with CHG Soap the night before surgery and the  morning of Surgery.  2.  If you choose to wash your hair, wash your hair first as usual with your  normal  shampoo.  3.  After you shampoo, rinse your hair and body thoroughly to remove the  shampoo.                                        4.  Use CHG as you would any other liquid soap.  You can apply chg directly  to the skin and wash                       Gently with a scrungie or clean washcloth.  5.  Apply the CHG Soap to your body ONLY FROM THE NECK DOWN.   Do not use on face/ open                           Wound or open sores. Avoid contact with eyes, ears mouth and genitals (private parts).                       Wash face,  Genitals (private parts) with your normal soap.             6.  Wash thoroughly, paying special attention to the area where your surgery  will be performed.  7.  Thoroughly rinse your body with warm water from the neck down.  8.  DO NOT shower/wash with your normal soap after using and rinsing  off  the CHG Soap.             9.  Pat yourself dry with a clean towel.            10.  Wear clean pajamas.            11.  Place clean sheets on your bed the night of your first shower and do not  sleep with pets. Day of Surgery : Do not apply any lotions/deodorants the morning of surgery.  Please wear clean clothes to the hospital/surgery center.  FAILURE TO FOLLOW THESE INSTRUCTIONS MAY RESULT IN THE CANCELLATION OF YOUR SURGERY PATIENT SIGNATURE_________________________________  NURSE SIGNATURE__________________________________  ________________________________________________________________________

## 2019-06-14 ENCOUNTER — Encounter (HOSPITAL_COMMUNITY)
Admission: RE | Admit: 2019-06-14 | Discharge: 2019-06-14 | Disposition: A | Discharge status: Home / Self Care | Payer: Medicare Other | Source: Ambulatory Visit | Attending: Surgery | Admitting: Surgery

## 2019-06-14 ENCOUNTER — Other Ambulatory Visit: Payer: Self-pay

## 2019-06-14 ENCOUNTER — Encounter (HOSPITAL_COMMUNITY): Payer: Self-pay

## 2019-06-14 DIAGNOSIS — Z87442 Personal history of urinary calculi: Secondary | ICD-10-CM | POA: Diagnosis not present

## 2019-06-14 DIAGNOSIS — Z87891 Personal history of nicotine dependence: Secondary | ICD-10-CM | POA: Diagnosis not present

## 2019-06-14 DIAGNOSIS — Z79899 Other long term (current) drug therapy: Secondary | ICD-10-CM | POA: Diagnosis not present

## 2019-06-14 DIAGNOSIS — K219 Gastro-esophageal reflux disease without esophagitis: Secondary | ICD-10-CM | POA: Diagnosis not present

## 2019-06-14 DIAGNOSIS — Z8673 Personal history of transient ischemic attack (TIA), and cerebral infarction without residual deficits: Secondary | ICD-10-CM | POA: Diagnosis not present

## 2019-06-14 DIAGNOSIS — F419 Anxiety disorder, unspecified: Secondary | ICD-10-CM | POA: Diagnosis not present

## 2019-06-14 DIAGNOSIS — E210 Primary hyperparathyroidism: Secondary | ICD-10-CM | POA: Diagnosis present

## 2019-06-14 DIAGNOSIS — Z7982 Long term (current) use of aspirin: Secondary | ICD-10-CM | POA: Diagnosis not present

## 2019-06-14 DIAGNOSIS — I10 Essential (primary) hypertension: Secondary | ICD-10-CM | POA: Diagnosis not present

## 2019-06-14 LAB — BASIC METABOLIC PANEL
Anion gap: 7 (ref 5–15)
BUN: 15 mg/dL (ref 8–23)
CO2: 27 mmol/L (ref 22–32)
Calcium: 10.3 mg/dL (ref 8.9–10.3)
Chloride: 104 mmol/L (ref 98–111)
Creatinine, Ser: 1.44 mg/dL — ABNORMAL HIGH (ref 0.44–1.00)
GFR calc Af Amer: 45 mL/min — ABNORMAL LOW (ref >60)
GFR calc non Af Amer: 39 mL/min — ABNORMAL LOW (ref >60)
Glucose, Bld: 107 mg/dL — ABNORMAL HIGH (ref 70–99)
Potassium: 3.6 mmol/L (ref 3.5–5.1)
Sodium: 138 mmol/L (ref 135–145)

## 2019-06-14 LAB — CBC
HCT: 38 % (ref 36.0–46.0)
Hemoglobin: 11.5 g/dL — ABNORMAL LOW (ref 12.0–15.0)
MCH: 26.7 pg (ref 26.0–34.0)
MCHC: 30.3 g/dL (ref 30.0–36.0)
MCV: 88.4 fL (ref 80.0–100.0)
Platelets: 268 10*3/uL (ref 150–400)
RBC: 4.3 MIL/uL (ref 3.87–5.11)
RDW: 14.1 % (ref 11.5–15.5)
WBC: 6.4 10*3/uL (ref 4.0–10.5)
nRBC: 0 % (ref 0.0–0.2)

## 2019-06-14 NOTE — Progress Notes (Signed)
PCP - Dr. Revonda Humphrey Cardiologist - no  Chest x-ray - no EKG - 06/14/19 Stress Test - no ECHO - no Cardiac Cath - no  Sleep Study - no CPAP -   Fasting Blood Sugar - NA Checks Blood Sugar _____ times a day  Blood Thinner Instructions:ASA Aspirin Instructions:Pt has not taken since 06/04/19 Last Dose:  Anesthesia review:   Patient denies shortness of breath, fever, cough and chest pain at PAT appointment yes  Patient verbalized understanding of instructions that were given to them at the PAT appointment. Patient was also instructed that they will need to review over the PAT instructions again at home before surgery. yes

## 2019-06-15 ENCOUNTER — Encounter (HOSPITAL_COMMUNITY): Payer: Medicare Other

## 2019-06-15 MED ORDER — CHLORHEXIDINE GLUCONATE CLOTH 2 % EX PADS: 6.0000 | MEDICATED_PAD | Freq: Once | CUTANEOUS | Status: DC

## 2019-06-15 MED ORDER — CEFAZOLIN SODIUM-DEXTROSE 2-4 GM/100ML-% IV SOLN: 2.0000 g | INTRAVENOUS | Status: DC

## 2019-06-19 ENCOUNTER — Other Ambulatory Visit (HOSPITAL_COMMUNITY)
Admission: RE | Admit: 2019-06-19 | Discharge: 2019-06-19 | Disposition: A | Discharge status: Home / Self Care | Payer: Medicare Other | Source: Ambulatory Visit | Attending: Surgery | Admitting: Surgery

## 2019-06-19 DIAGNOSIS — Z20822 Contact with and (suspected) exposure to covid-19: Secondary | ICD-10-CM | POA: Diagnosis not present

## 2019-06-19 DIAGNOSIS — Z01812 Encounter for preprocedural laboratory examination: Secondary | ICD-10-CM | POA: Insufficient documentation

## 2019-06-19 LAB — SARS CORONAVIRUS 2 (TAT 6-24 HRS): SARS Coronavirus 2: NEGATIVE

## 2019-06-20 ENCOUNTER — Encounter (HOSPITAL_COMMUNITY): Payer: Self-pay | Admitting: Surgery

## 2019-06-20 DIAGNOSIS — E210 Primary hyperparathyroidism: Secondary | ICD-10-CM | POA: Diagnosis present

## 2019-06-20 NOTE — H&P (Signed)
General Surgery [facility]  Sierra Krueger DOB: 1955-11-15 Divorced / Language: Cleophus Molt / Race: Black or African American Female   History of Present Illness  The patient is a 64 year old female who presents with primary hyperparathyroidism.  CHIEF COMPLAINT: primary hyperparathyroidism  Patient returns for follow-up having undergone multiple test for evaluation of primary hyperparathyroidism. She is accompanied by her son. We reviewed diagnostic studies including her ultrasound examination which was performed on October 05, 2018. This demonstrated 2 benign nodules in the right thyroid lobe. No biopsy was indicated. There was no evidence of parathyroid adenoma. Patient subsequently underwent a nuclear medicine parathyroid scan on October 05, 2018. This was negative for parathyroid adenoma. Patient then underwent a 4D CT scan of the neck on December 26, 2018. This study was also negative for evidence of parathyroid adenoma. Repeat laboratory studies however demonstrated a persistently elevated calcium level of 11.4 and an elevated intact PTH level of 84. Patient has a history of nephrolithiasis. She has a history of left hip pain. She has a history of chronic fatigue. She returns today to discuss surgical intervention for management of primary hyperparathyroidism.   Problem List/Past Medical  HYPERCALCEMIA (E83.52)  PRIMARY HYPERPARATHYROIDISM (E21.0)  HISTORY OF NEPHROLITHIASIS [id number])   Past Surgical History Foot Surgery  Left. Hip Surgery  Left.  Diagnostic Studies History Colonoscopy  1-5 years ago Mammogram  1-3 years ago  Allergies  No Known Drug Allergies  Allergies Reconciled   Medication History  oxyCODONE HCl (10MG  Tablet, Oral) Active. amLODIPine Besylate (2.5MG  Tablet, Oral) Active. Aspirin (325MG  Tablet, Oral) Active. Cetirizine HCl (10MG  Tablet, Oral) Active. Flonase (50MCG/DOSE Inhaler, Nasal) Active. Mirtazapine (15MG   Tablet, Oral) Active. Pantoprazole Sodium (40MG  Tablet DR, Oral) Active. tiZANidine HCl (4MG  Tablet, Oral) Active. Zolpidem Tartrate (10MG  Tablet, Oral) Active. Gabapentin (100MG  Capsule, Oral) Active. Pregabalin (25MG  Capsule, Oral) Active. Medications Reconciled  Social History  Alcohol use  Occasional alcohol use. Tobacco use  Former smoker.  Family History  Alcohol Abuse  Father. Arthritis  Father, Mother. Hypertension  Mother. Respiratory Condition  Mother.  Pregnancy / Birth History  Age of menopause  <47 Irregular periods   Other Problems Anxiety Disorder  Back Pain  Cerebrovascular Accident  Gastroesophageal Reflux Disease   Vitals Weight: 158 lb Height: 66in Body Surface Area: 1.81 m Body Mass Index: 25.5 kg/m  Temp.: 76F  Pulse: 101 (Regular)  BP: 132/84 (Sitting, Left Arm, Standard)   Physical Exam   GENERAL APPEARANCE Development: normal Nutritional status: normal Gross deformities: none  SKIN Rash, lesions, ulcers: none Induration, erythema: none Nodules: none palpable  EYES Conjunctiva and lids: normal Pupils: equal and reactive Iris: normal bilaterally  EARS, NOSE, MOUTH, THROAT External ears: no lesion or deformity External nose: no lesion or deformity Hearing: grossly normal Patient is wearing a mask.  NECK Symmetric: yes Trachea: midline Thyroid: no palpable nodules in the thyroid bed  CHEST Respiratory effort: normal Retraction or accessory muscle use: no Breath sounds: normal bilaterally Rales, rhonchi, wheeze: none  CARDIOVASCULAR Auscultation: regular rhythm, normal rate Murmurs: none Pulses: carotid and radial pulse 2+ palpable Lower extremity edema: none Lower extremity varicosities: none  MUSCULOSKELETAL Station and gait: normal Digits and nails: no clubbing or cyanosis Muscle strength: grossly normal all extremities Range of motion: grossly normal all extremities Deformity:  none  LYMPHATIC Cervical: none palpable Supraclavicular: none palpable  PSYCHIATRIC Oriented to person, place, and time: yes Mood and affect: normal for situation Judgment and insight: appropriate for situation  Assessment & Plan  PRIMARY HYPERPARATHYROIDISM (E21.0)  Patient returns today accompanied by her son to review the results of her ultrasound examination of the neck, her nuclear medicine parathyroid scan, and her CT scan of the chest with parathyroid protocol. I provided them with copies of the results of all of these studies. All of these studies failed to identify evidence of parathyroid adenoma. However, the patient continues to have hypercalcemia with her most recent calcium level elevated at 11.4 and intact PTH level elevated at 84.  I have recommended proceeding with neck exploration. We will plan to evaluate all 4 parathyroid glands. This would require an overnight hospitalization for postoperative observation. We discussed the risk and benefits of the procedure. We discussed the likelihood that we would be able to identify a parathyroid adenoma. We discussed the potential need for additional testing following the procedure if we are unable to identify a parathyroid adenoma. We discussed the location and size of the surgical incision. We discussed the postoperative recovery and return to normal activity. The patient understands and wishes to proceed. We will plan her surgery for January 2021.  The risks and benefits of the procedure have been discussed at length with the patient. The patient understands the proposed procedure, potential alternative treatments, and the course of recovery to be expected. All of the patient's questions have been answered at this time. The patient wishes to proceed with surgery.  Armandina Gemma, MD [facility] Office: [phone]

## 2019-06-22 ENCOUNTER — Ambulatory Visit (HOSPITAL_COMMUNITY): Payer: Medicare Other | Admitting: Physician Assistant

## 2019-06-22 ENCOUNTER — Telehealth (HOSPITAL_COMMUNITY): Payer: Self-pay

## 2019-06-22 ENCOUNTER — Ambulatory Visit (HOSPITAL_COMMUNITY): Payer: Medicare Other

## 2019-06-22 ENCOUNTER — Encounter (HOSPITAL_COMMUNITY): Payer: Self-pay | Admitting: Surgery

## 2019-06-22 ENCOUNTER — Encounter (HOSPITAL_COMMUNITY)
Admission: RE | Disposition: A | Discharge status: Home / Self Care | Payer: Self-pay | Source: Other Acute Inpatient Hospital | Attending: Surgery

## 2019-06-22 ENCOUNTER — Ambulatory Visit (HOSPITAL_COMMUNITY)
Admission: RE | Admit: 2019-06-22 | Discharge: 2019-06-23 | Disposition: A | Discharge status: Home / Self Care | Payer: Medicare Other | Source: Other Acute Inpatient Hospital | Attending: Surgery | Admitting: Surgery

## 2019-06-22 ENCOUNTER — Other Ambulatory Visit: Payer: Self-pay

## 2019-06-22 DIAGNOSIS — Z8673 Personal history of transient ischemic attack (TIA), and cerebral infarction without residual deficits: Secondary | ICD-10-CM | POA: Insufficient documentation

## 2019-06-22 DIAGNOSIS — E210 Primary hyperparathyroidism: Secondary | ICD-10-CM | POA: Diagnosis not present

## 2019-06-22 DIAGNOSIS — Z87442 Personal history of urinary calculi: Secondary | ICD-10-CM | POA: Diagnosis not present

## 2019-06-22 DIAGNOSIS — F419 Anxiety disorder, unspecified: Secondary | ICD-10-CM | POA: Insufficient documentation

## 2019-06-22 DIAGNOSIS — Z87891 Personal history of nicotine dependence: Secondary | ICD-10-CM | POA: Insufficient documentation

## 2019-06-22 DIAGNOSIS — Z79899 Other long term (current) drug therapy: Secondary | ICD-10-CM | POA: Insufficient documentation

## 2019-06-22 DIAGNOSIS — I10 Essential (primary) hypertension: Secondary | ICD-10-CM | POA: Diagnosis not present

## 2019-06-22 DIAGNOSIS — K219 Gastro-esophageal reflux disease without esophagitis: Secondary | ICD-10-CM | POA: Insufficient documentation

## 2019-06-22 DIAGNOSIS — Z7982 Long term (current) use of aspirin: Secondary | ICD-10-CM | POA: Insufficient documentation

## 2019-06-22 HISTORY — PX: PARATHYROIDECTOMY: SHX19

## 2019-06-22 SURGERY — PARATHYROIDECTOMY
Anesthesia: General | Site: Neck

## 2019-06-22 MED ORDER — DEXAMETHASONE SODIUM PHOSPHATE 10 MG/ML IJ SOLN
INTRAMUSCULAR | Status: AC
  Filled 2019-06-22: qty 1

## 2019-06-22 MED ORDER — BUPIVACAINE HCL (PF) 0.5 % IJ SOLN: INTRAMUSCULAR | Status: DC | PRN

## 2019-06-22 MED ORDER — PREGABALIN 25 MG PO CAPS
25.0000 mg | ORAL_CAPSULE | Freq: Three times a day (TID) | ORAL | Status: DC | PRN
  Administered 2019-06-22: 25 mg via ORAL
  Filled 2019-06-22: qty 1

## 2019-06-22 MED ORDER — ONDANSETRON HCL 4 MG/2ML IJ SOLN: 4.0000 mg | Freq: Once | INTRAMUSCULAR | Status: DC | PRN

## 2019-06-22 MED ORDER — ROCURONIUM BROMIDE 100 MG/10ML IV SOLN
INTRAVENOUS | Status: DC | PRN
  Administered 2019-06-22: 50 mg via INTRAVENOUS

## 2019-06-22 MED ORDER — OXYCODONE HCL 5 MG PO TABS
5.0000 mg | ORAL_TABLET | ORAL | Status: DC | PRN
  Administered 2019-06-22 (×2): 10 mg via ORAL
  Administered 2019-06-23: 5 mg via ORAL
  Administered 2019-06-23: 10 mg via ORAL
  Administered 2019-06-23: 5 mg via ORAL
  Filled 2019-06-22: qty 2
  Filled 2019-06-22: qty 1
  Filled 2019-06-22 (×3): qty 2

## 2019-06-22 MED ORDER — BUPIVACAINE HCL (PF) 0.5 % IJ SOLN
INTRAMUSCULAR | Status: AC
  Filled 2019-06-22: qty 30

## 2019-06-22 MED ORDER — MIDAZOLAM HCL 2 MG/2ML IJ SOLN
INTRAMUSCULAR | Status: AC
  Filled 2019-06-22: qty 2

## 2019-06-22 MED ORDER — HYDROMORPHONE HCL 1 MG/ML IJ SOLN
1.0000 mg | INTRAMUSCULAR | Status: DC | PRN
  Administered 2019-06-22 (×2): 1 mg via INTRAVENOUS
  Filled 2019-06-22 (×2): qty 1

## 2019-06-22 MED ORDER — MIDAZOLAM HCL 5 MG/5ML IJ SOLN
INTRAMUSCULAR | Status: DC | PRN
  Administered 2019-06-22: 2 mg via INTRAVENOUS

## 2019-06-22 MED ORDER — ONDANSETRON HCL 4 MG/2ML IJ SOLN: 4.0000 mg | Freq: Four times a day (QID) | INTRAMUSCULAR | Status: DC | PRN

## 2019-06-22 MED ORDER — ACETAMINOPHEN 325 MG PO TABS: 650.0000 mg | ORAL_TABLET | Freq: Four times a day (QID) | ORAL | Status: DC | PRN

## 2019-06-22 MED ORDER — CEFAZOLIN SODIUM-DEXTROSE 2-4 GM/100ML-% IV SOLN
INTRAVENOUS | Status: AC
  Filled 2019-06-22: qty 100

## 2019-06-22 MED ORDER — ONDANSETRON HCL 4 MG/2ML IJ SOLN
INTRAMUSCULAR | Status: DC | PRN
  Administered 2019-06-22: 4 mg via INTRAVENOUS

## 2019-06-22 MED ORDER — OXYCODONE HCL 5 MG/5ML PO SOLN: 5.0000 mg | Freq: Once | ORAL | Status: DC | PRN

## 2019-06-22 MED ORDER — ROCURONIUM BROMIDE 10 MG/ML (PF) SYRINGE
PREFILLED_SYRINGE | INTRAVENOUS | Status: AC
  Filled 2019-06-22: qty 10

## 2019-06-22 MED ORDER — PANTOPRAZOLE SODIUM 40 MG PO TBEC
40.0000 mg | DELAYED_RELEASE_TABLET | Freq: Two times a day (BID) | ORAL | Status: DC
  Administered 2019-06-22 – 2019-06-23 (×2): 40 mg via ORAL
  Filled 2019-06-22 (×2): qty 1

## 2019-06-22 MED ORDER — LIDOCAINE HCL (CARDIAC) PF 100 MG/5ML IV SOSY
PREFILLED_SYRINGE | INTRAVENOUS | Status: DC | PRN
  Administered 2019-06-22: 60 mg via INTRAVENOUS

## 2019-06-22 MED ORDER — PROPOFOL 10 MG/ML IV BOLUS
INTRAVENOUS | Status: DC | PRN
  Administered 2019-06-22: 120 mg via INTRAVENOUS

## 2019-06-22 MED ORDER — ACETAMINOPHEN 650 MG RE SUPP: 650.0000 mg | Freq: Four times a day (QID) | RECTAL | Status: DC | PRN

## 2019-06-22 MED ORDER — TRAMADOL HCL 50 MG PO TABS
50.0000 mg | ORAL_TABLET | Freq: Four times a day (QID) | ORAL | Status: DC | PRN
  Filled 2019-06-22: qty 1

## 2019-06-22 MED ORDER — HYDROMORPHONE HCL 1 MG/ML IJ SOLN
0.2500 mg | INTRAMUSCULAR | Status: DC | PRN
  Administered 2019-06-22 (×4): 0.5 mg via INTRAVENOUS

## 2019-06-22 MED ORDER — CEFAZOLIN SODIUM-DEXTROSE 2-4 GM/100ML-% IV SOLN
2.0000 g | Freq: Once | INTRAVENOUS | Status: AC
  Administered 2019-06-22: 09:00:00 2 g via INTRAVENOUS

## 2019-06-22 MED ORDER — HYDROMORPHONE HCL 1 MG/ML IJ SOLN
INTRAMUSCULAR | Status: AC
  Filled 2019-06-22: qty 2

## 2019-06-22 MED ORDER — ONDANSETRON 4 MG PO TBDP
4.0000 mg | ORAL_TABLET | Freq: Four times a day (QID) | ORAL | Status: DC | PRN
  Administered 2019-06-22: 4 mg via ORAL
  Filled 2019-06-22: qty 1

## 2019-06-22 MED ORDER — ONDANSETRON HCL 4 MG/2ML IJ SOLN
INTRAMUSCULAR | Status: AC
  Filled 2019-06-22: qty 2

## 2019-06-22 MED ORDER — FENTANYL CITRATE (PF) 250 MCG/5ML IJ SOLN
INTRAMUSCULAR | Status: AC
  Filled 2019-06-22: qty 5

## 2019-06-22 MED ORDER — 0.9 % SODIUM CHLORIDE (POUR BTL) OPTIME
TOPICAL | Status: DC | PRN
  Administered 2019-06-22: 1000 mL

## 2019-06-22 MED ORDER — MIRTAZAPINE 15 MG PO TABS
45.0000 mg | ORAL_TABLET | Freq: Every day | ORAL | Status: DC
  Administered 2019-06-22: 45 mg via ORAL
  Filled 2019-06-22: qty 3

## 2019-06-22 MED ORDER — OXYCODONE HCL 5 MG PO TABS: 5.0000 mg | ORAL_TABLET | Freq: Once | ORAL | Status: DC | PRN

## 2019-06-22 MED ORDER — LACTATED RINGERS IV SOLN: INTRAVENOUS | Status: DC

## 2019-06-22 MED ORDER — FENTANYL CITRATE (PF) 100 MCG/2ML IJ SOLN
INTRAMUSCULAR | Status: AC
  Filled 2019-06-22: qty 4

## 2019-06-22 MED ORDER — ZOLPIDEM TARTRATE 5 MG PO TABS
5.0000 mg | ORAL_TABLET | Freq: Every evening | ORAL | Status: DC | PRN
  Administered 2019-06-22: 5 mg via ORAL
  Filled 2019-06-22: qty 1

## 2019-06-22 MED ORDER — LAMOTRIGINE 25 MG PO TABS
25.0000 mg | ORAL_TABLET | Freq: Every day | ORAL | Status: DC
  Administered 2019-06-22: 21:00:00 25 mg via ORAL
  Filled 2019-06-22: qty 1

## 2019-06-22 MED ORDER — FENTANYL CITRATE (PF) 250 MCG/5ML IJ SOLN
INTRAMUSCULAR | Status: DC | PRN
  Administered 2019-06-22 (×2): 50 ug via INTRAVENOUS
  Administered 2019-06-22: 100 ug via INTRAVENOUS
  Administered 2019-06-22: 50 ug via INTRAVENOUS

## 2019-06-22 MED ORDER — DEXAMETHASONE SODIUM PHOSPHATE 10 MG/ML IJ SOLN
INTRAMUSCULAR | Status: DC | PRN
  Administered 2019-06-22: 8 mg via INTRAVENOUS

## 2019-06-22 MED ORDER — PROPOFOL 10 MG/ML IV BOLUS
INTRAVENOUS | Status: AC
  Filled 2019-06-22: qty 20

## 2019-06-22 MED ORDER — KCL IN DEXTROSE-NACL 20-5-0.45 MEQ/L-%-% IV SOLN
INTRAVENOUS | Status: DC
  Filled 2019-06-22: qty 1000

## 2019-06-22 MED ORDER — AMLODIPINE BESYLATE 5 MG PO TABS
5.0000 mg | ORAL_TABLET | Freq: Every day | ORAL | Status: DC
  Administered 2019-06-23: 5 mg via ORAL
  Filled 2019-06-22: qty 1

## 2019-06-22 MED ORDER — FENTANYL CITRATE (PF) 100 MCG/2ML IJ SOLN
25.0000 ug | INTRAMUSCULAR | Status: DC | PRN
  Administered 2019-06-22 (×3): 50 ug via INTRAVENOUS

## 2019-06-22 MED ORDER — SUGAMMADEX SODIUM 200 MG/2ML IV SOLN
INTRAVENOUS | Status: DC | PRN
  Administered 2019-06-22: 148.8 mg via INTRAVENOUS

## 2019-06-22 MED ORDER — FENTANYL CITRATE (PF) 100 MCG/2ML IJ SOLN
INTRAMUSCULAR | Status: DC | PRN
  Administered 2019-06-22 (×2): 50 ug via INTRAVENOUS

## 2019-06-22 MED ORDER — FENTANYL CITRATE (PF) 100 MCG/2ML IJ SOLN
INTRAMUSCULAR | Status: AC
  Filled 2019-06-22: qty 2

## 2019-06-22 MED ORDER — LIDOCAINE 2% (20 MG/ML) 5 ML SYRINGE
INTRAMUSCULAR | Status: AC
  Filled 2019-06-22: qty 5

## 2019-06-22 SURGICAL SUPPLY — 33 items
ADH SKN CLS APL DERMABOND .7 (GAUZE/BANDAGES/DRESSINGS) ×1
APL PRP STRL LF DISP 70% ISPRP (MISCELLANEOUS) ×1
ATTRACTOMAT 16X20 MAGNETIC DRP (DRAPES) ×3 IMPLANT
BLADE SURG 15 STRL LF DISP TIS (BLADE) ×1 IMPLANT
BLADE SURG 15 STRL SS (BLADE) ×3
CHLORAPREP W/TINT 26 (MISCELLANEOUS) ×3 IMPLANT
CLIP VESOCCLUDE MED 6/CT (CLIP) ×6 IMPLANT
CLIP VESOCCLUDE SM WIDE 6/CT (CLIP) ×6 IMPLANT
COVER SURGICAL LIGHT HANDLE (MISCELLANEOUS) ×3 IMPLANT
COVER WAND RF STERILE (DRAPES) ×3 IMPLANT
DERMABOND ADVANCED (GAUZE/BANDAGES/DRESSINGS) ×2
DERMABOND ADVANCED .7 DNX12 (GAUZE/BANDAGES/DRESSINGS) ×1 IMPLANT
DRAPE LAPAROTOMY T 98X78 PEDS (DRAPES) ×3 IMPLANT
ELECT REM PT RETURN 15FT ADLT (MISCELLANEOUS) ×3 IMPLANT
GAUZE 4X4 16PLY RFD (DISPOSABLE) ×3 IMPLANT
GLOVE SURG ORTHO 8.0 STRL STRW (GLOVE) ×3 IMPLANT
GOWN STRL REUS W/TWL XL LVL3 (GOWN DISPOSABLE) ×9 IMPLANT
HEMOSTAT SURGICEL 2X4 FIBR (HEMOSTASIS) ×3 IMPLANT
ILLUMINATOR WAVEGUIDE N/F (MISCELLANEOUS) IMPLANT
KIT BASIN OR (CUSTOM PROCEDURE TRAY) ×3 IMPLANT
KIT TURNOVER KIT A (KITS) IMPLANT
NDL HYPO 25X1 1.5 SAFETY (NEEDLE) ×1 IMPLANT
NEEDLE HYPO 25X1 1.5 SAFETY (NEEDLE) ×3 IMPLANT
PACK BASIC VI WITH GOWN DISP (CUSTOM PROCEDURE TRAY) ×3 IMPLANT
PENCIL SMOKE EVACUATOR (MISCELLANEOUS) ×3 IMPLANT
SUT MNCRL AB 4-0 PS2 18 (SUTURE) ×3 IMPLANT
SUT VIC AB 3-0 SH 18 (SUTURE) ×3 IMPLANT
SYR BULB IRRIGATION 50ML (SYRINGE) ×3 IMPLANT
SYR CONTROL 10ML LL (SYRINGE) ×3 IMPLANT
TOWEL OR 17X26 10 PK STRL BLUE (TOWEL DISPOSABLE) ×3 IMPLANT
TOWEL OR NON WOVEN STRL DISP B (DISPOSABLE) ×3 IMPLANT
TUBING CONNECTING 10 (TUBING) ×2 IMPLANT
TUBING CONNECTING 10' (TUBING) ×1

## 2019-06-22 NOTE — Progress Notes (Signed)
Patient arrived to unit with a 2 mews score. Dr is already aware. Pain medication given.

## 2019-06-22 NOTE — Op Note (Signed)
Operative Note  Pre-operative Diagnosis:  Primary hyperparathyroidism  Post-operative Diagnosis:  same  Surgeon:  Armandina Gemma, MD  Assistant:  Junita Push, PA-Student   Procedure:  Neck exploration with parathyroidectomy (right superior), parathyroid biopsy (left superior)  Anesthesia:  general  Estimated Blood Loss:  minimal  Drains: none         Specimen: to pathology  Indications:  Patient returns for follow-up having undergone multiple test for evaluation of primary hyperparathyroidism. She is accompanied by her son. We reviewed diagnostic studies including her ultrasound examination which was performed on October 05, 2018. This demonstrated 2 benign nodules in the right thyroid lobe. No biopsy was indicated. There was no evidence of parathyroid adenoma. Patient subsequently underwent a nuclear medicine parathyroid scan on October 05, 2018. This was negative for parathyroid adenoma. Patient then underwent a 4D CT scan of the neck on December 26, 2018. This study was also negative for evidence of parathyroid adenoma. Repeat laboratory studies however demonstrated a persistently elevated calcium level of 11.4 and an elevated intact PTH level of 84. Patient has a history of nephrolithiasis. She has a history of left hip pain. She has a history of chronic fatigue. She returns today to discuss surgical intervention for management of primary hyperparathyroidism.  Procedure:  The patient was seen in the pre-op holding area. The risks, benefits, complications, treatment options, and expected outcomes were previously discussed with the patient. The patient agreed with the proposed plan and has signed the informed consent form.  The patient was brought to the operating room by the surgical team, identified as Sierra Krueger and the procedure verified. A "time out" was completed and the above information confirmed.  Following induction of general endotracheal anesthesia, the patient is  positioned and then prepped and draped in the usual aseptic fashion.  After ascertaining that an adequate level of anesthesia been achieved, a Kocher incision is made with a #15 blade.  Dissection is carried through subcutaneous tissues and hemostasis achieved with the electrocautery.  Platysma is divided.  External jugular veins are ligated in continuity with 3-0 silk ties and divided.  Subplatysmal skin flaps are elevated cephalad and caudad from the thyroid notch to the sternal notch.  A Mahorner self-retaining retractors placed for exposure.  Strap muscles are incised in the midline.  Dissection is begun on the left side.  Left thyroid lobe was gently mobilized with blunt dissection.  Small venous tributaries are divided between ligaclips with the electrocautery.  Dissection posterior to the superior pole of the left thyroid lobe shows a mildly enlarged but normal-appearing parathyroid gland.  Further dissection on the left side reveals what appears to be a normal parathyroid gland in the mid thyroid thymic tract inferiorly on the left side.  These glands are both left in situ.  Next the right thyroid lobe was mobilized.  It was gently rolled anteriorly.  Venous tributaries were divided between small ligaclips.  Exploration reveals an enlarged right superior parathyroid gland posterior to the superior pole of the thyroid.  This is more rounded and nodular consistent with adenoma.  It is mobilized.  Vascular structures are divided between small ligaclips.  The entire gland is excised and submitted to pathology for review.  Frozen section by Dr. Vicente Males confirms parathyroid tissue consistent with parathyroid adenoma.  The left superior parathyroid gland is then biopsied.  This is performed by mobilizing the corner of the gland and dividing the gland between medium ligaclips.  The small fragment of parathyroid tissue was  submitted to pathology.  Dr. Tresa Moore confirms parathyroid tissue which appears  normal.  Good hemostasis is achieved throughout the neck.  Fibrillar is placed throughout the operative field.  Strap muscles are reapproximated in the midline of interrupted 3-0 Vicryl sutures.  Platysma was closed with interrupted 3-0 Vicryl sutures.  Skin is closed with a 4-0 Monocryl subcuticular suture.  Wound is washed and dried and Dermabond is placed as dressing.  Patient is awakened from anesthesia and transported to the recovery room.  The patient tolerated the procedure well.   Armandina Gemma, MD [facility] Office: [phone]

## 2019-06-22 NOTE — Progress Notes (Signed)
Dr. Harlow Asa returned page- says if heart rate spikes again to get an EKg is HR is 120 BPM. Call bell within reach.

## 2019-06-22 NOTE — Telephone Encounter (Signed)
06/22/19 @ 9:12AM CALLED PT LVM FOR PT TO CALL AND SCHEDULE APPT/[facility]   06/20/19 RECEIVED A REFERRAL FROM DR AVBUERE FOR AN ABI ON PATIENT.

## 2019-06-22 NOTE — Anesthesia Procedure Notes (Signed)
Procedure Name: Intubation Date/Time: 06/22/2019 9:13 AM Performed by: Glory Buff, CRNA Pre-anesthesia Checklist: Patient identified, Emergency Drugs available, Suction available and Patient being monitored Patient Re-evaluated:Patient Re-evaluated prior to induction Oxygen Delivery Method: Circle system utilized Preoxygenation: Pre-oxygenation with 100% oxygen Induction Type: IV induction Ventilation: Mask ventilation without difficulty Laryngoscope Size: Miller and 3 Grade View: Grade I Tube type: Oral Tube size: 7.0 mm Number of attempts: 1 Airway Equipment and Method: Stylet and Oral airway Placement Confirmation: ETT inserted through vocal cords under direct vision,  positive ETCO2 and breath sounds checked- equal and bilateral Secured at: 21 cm Tube secured with: Tape Dental Injury: Teeth and Oropharynx as per pre-operative assessment

## 2019-06-22 NOTE — Anesthesia Postprocedure Evaluation (Signed)
Anesthesia Post Note  Patient: Sierra Krueger  Procedure(s) Performed: NECK EXPLORATION WITH PARATHYROIDECTOMY WITH PARATHYROID BIOPSY (N/A Neck)     Patient location during evaluation: PACU Anesthesia Type: General Level of consciousness: awake and alert Pain management: satisfactory to patient Vital Signs Assessment: post-procedure vital signs reviewed and stable Respiratory status: spontaneous breathing, nonlabored ventilation and respiratory function stable Cardiovascular status: blood pressure returned to baseline and stable Postop Assessment: no apparent nausea or vomiting Anesthetic complications: no    Last Vitals:  Vitals:   06/22/19 1130 06/22/19 1145  BP: (!) 156/104 (!) 170/97  Pulse: (!) 102 (!) 107  Resp: 14 16  Temp:    SpO2: 98% 100%    Last Pain:  Vitals:   06/22/19 1131  TempSrc:   PainSc: 8                  Aijalon Demuro E Monserrat Vidaurri

## 2019-06-22 NOTE — Progress Notes (Signed)
Patient is tachycardic- paged Dr. Marlou Starks- awaiting orders- will continue to monitor closely. Call bell within reach, and continuous pulse ox monitor on patient. Pain managed and assessed.

## 2019-06-22 NOTE — Progress Notes (Signed)
Patient heart rate up to 121 at rest. Pain medication has been given according to orders. Dr. Armandina Gemma notified/ paged due to heart rate being tachycardic since arriving to the floor from pacu. All other vitals stable. Patient is able to drink and swallow without any complications. Will continue to monitor closely.

## 2019-06-22 NOTE — Transfer of Care (Signed)
Immediate Anesthesia Transfer of Care Note  Patient: Sierra Krueger  Procedure(s) Performed: Procedure(s): NECK EXPLORATION WITH PARATHYROIDECTOMY WITH PARATHYROID BIOPSY (N/A)  Patient Location: PACU  Anesthesia Type:General  Level of Consciousness:  sedated, patient cooperative and responds to stimulation  Airway & Oxygen Therapy:Patient Spontanous Breathing and Patient connected to face mask oxgen  Post-op Assessment:  Report given to PACU RN and Post -op Vital signs reviewed and stable  Post vital signs:  Reviewed and stable  Last Vitals:  Vitals:   06/22/19 0703  BP: (!) 143/94  Pulse: 99  Resp: 16  Temp: 37.1 C  SpO2: 0000000    Complications: No apparent anesthesia complications

## 2019-06-22 NOTE — Plan of Care (Signed)

## 2019-06-22 NOTE — Anesthesia Preprocedure Evaluation (Addendum)
Anesthesia Evaluation  Patient identified by MRN, date of birth, ID band Patient awake    Reviewed: Allergy & Precautions, NPO status , Patient's Chart, lab work & pertinent test results  History of Anesthesia Complications Negative for: history of anesthetic complications  Airway Mallampati: II  TM Distance: >3 FB Neck ROM: Full    Dental  (+) Partial Upper   Pulmonary neg pulmonary ROS, former smoker,    Pulmonary exam normal        Cardiovascular hypertension, Normal cardiovascular exam     Neuro/Psych CVA (2007), Residual Symptoms negative psych ROS   GI/Hepatic negative GI ROS, Neg liver ROS,   Endo/Other  negative endocrine ROS  Renal/GU Renal InsufficiencyRenal disease  negative genitourinary   Musculoskeletal negative musculoskeletal ROS (+)   Abdominal   Peds  Hematology  (+) anemia ,   Anesthesia Other Findings   Reproductive/Obstetrics                             Anesthesia Physical Anesthesia Plan  ASA: III  Anesthesia Plan: General   Post-op Pain Management:    Induction: Intravenous  PONV Risk Score and Plan: 3 and Ondansetron, Dexamethasone, Treatment may vary due to age or medical condition and Midazolam  Airway Management Planned: Oral ETT  Additional Equipment: None  Intra-op Plan:   Post-operative Plan: Extubation in OR  Informed Consent: I have reviewed the patients History and Physical, chart, labs and discussed the procedure including the risks, benefits and alternatives for the proposed anesthesia with the patient or authorized representative who has indicated his/her understanding and acceptance.     Dental advisory given  Plan Discussed with:   Anesthesia Plan Comments:         Anesthesia Quick Evaluation

## 2019-06-22 NOTE — Interval H&P Note (Signed)
History and Physical Interval Note:  06/22/2019 8:41 AM  Sierra Krueger  has presented today for surgery, with the diagnosis of PRIMARY HYPERPARATHYROIDISM.  The various methods of treatment have been discussed with the patient and family. After consideration of risks, benefits and other options for treatment, the patient has consented to    Procedure(s): NECK EXPLORATION WITH PARATHYROIDECTOMY (N/A) as a surgical intervention.    The patient's history has been reviewed, patient examined, no change in status, stable for surgery.  I have reviewed the patient's chart and labs.  Questions were answered to the patient's satisfaction.    Armandina Gemma, MD [facility] Office: [place]

## 2019-06-23 DIAGNOSIS — E210 Primary hyperparathyroidism: Secondary | ICD-10-CM | POA: Diagnosis not present

## 2019-06-23 LAB — BASIC METABOLIC PANEL
Anion gap: 9 (ref 5–15)
BUN: 12 mg/dL (ref 8–23)
CO2: 24 mmol/L (ref 22–32)
Calcium: 9.5 mg/dL (ref 8.9–10.3)
Chloride: 109 mmol/L (ref 98–111)
Creatinine, Ser: 1.32 mg/dL — ABNORMAL HIGH (ref 0.44–1.00)
GFR calc Af Amer: 50 mL/min — ABNORMAL LOW (ref >60)
GFR calc non Af Amer: 43 mL/min — ABNORMAL LOW (ref >60)
Glucose, Bld: 128 mg/dL — ABNORMAL HIGH (ref 70–99)
Potassium: 3.7 mmol/L (ref 3.5–5.1)
Sodium: 142 mmol/L (ref 135–145)

## 2019-06-23 MED ORDER — OXYCODONE HCL 5 MG PO TABS: 5.0000 mg | ORAL_TABLET | ORAL | 0 refills | Status: DC | PRN

## 2019-06-23 NOTE — Discharge Summary (Signed)
Physician Discharge Summary [facility]  Patient ID: Sierra Krueger MRN: [id number] DOB/AGE: [birth year] 62 y.o.  Admit date: 06/22/2019 Discharge date: 06/23/2019  Admission Diagnoses:  Primary hyperparathyroidism  Discharge Diagnoses:  Principal Problem:   Hyperparathyroidism, primary ([place]) Active Problems:   Primary hyperparathyroidism [facility])   Discharged Condition: good  Hospital Course: Patient was admitted for observation following parathyroid surgery.  Post op course was uncomplicated.  Pain was well controlled.  Tolerated diet.  Post op calcium level on morning following surgery was 9.5 mg/dl.  Patient was prepared for discharge home on POD#1.  Consults: None  Treatments: surgery: neck exploration and parathyroidectomy, parathyroid biopsy  Discharge Exam: Blood pressure (!) 147/89, pulse (!) 110, temperature 99.3 F (37.4 C), temperature source Oral, resp. rate 15, height 5\' 4"  (1.626 m), weight 74.4 kg, SpO2 94 %. HEENT - clear Neck - wound dry and intact; mild STS; voice normal Chest - clear bilaterally Cor - RRR  Disposition: Home  Discharge Instructions    Diet - low sodium heart healthy   Complete by: As directed    Discharge instructions   Complete by: As directed    [facility]  THYROID & PARATHYROID SURGERY:  POST-OP INSTRUCTIONS  Always review your discharge instruction sheet from the facility where your surgery was performed.  A prescription for pain medication may be given to you upon discharge.  Take your pain medication as prescribed.  If narcotic pain medicine is not needed, then you may take acetaminophen (Tylenol) or ibuprofen (Advil) as needed.  Take your usually prescribed medications unless otherwise directed.  If you need a refill on your pain medication, please contact our office during regular business hours.  Prescriptions cannot be processed by our office after 5 pm or on weekends.  Start with a  light diet upon arrival home, such as soup and crackers or toast.  Be sure to drink plenty of fluids daily.  Resume your normal diet the day after surgery.  Most patients will experience some swelling and bruising on the chest and neck area.  Ice packs will help.  Swelling and bruising can take several days to resolve.   It is common to experience some constipation after surgery.  Increasing fluid intake and taking a stool softener (Colace) will usually help or prevent this problem.  A mild laxative (Milk of Magnesia or Miralax) should be taken according to package directions if there has been no bowel movement after 48 hours.  You have steri-strips and a gauze dressing over your incision.  You may remove the gauze bandage on the second day after surgery, and you may shower at that time.  Leave your steri-strips (small skin tapes) in place directly over the incision.  These strips should remain on the skin for 5-7 days and then be removed.  You may get them wet in the shower and pat them dry.  You may resume regular (light) daily activities beginning the next day (such as daily self-care, walking, climbing stairs) gradually increasing activities as tolerated.  You may have sexual intercourse when it is comfortable.  Refrain from any heavy lifting or straining until approved by your doctor.  You may drive when you no longer are taking prescription pain medication, you can comfortably wear a seatbelt, and you can safely maneuver your car and apply brakes.  You should see your doctor in the office for a follow-up appointment approximately three weeks after your surgery.  Make sure that you  call for this appointment within a day or two after you arrive home to insure a convenient appointment time.  WHEN TO CALL YOUR DOCTOR: -- Fever greater than 101.5 -- Inability to urinate -- Nausea and/or vomiting - persistent -- Extreme swelling or bruising -- Continued bleeding from incision -- Increased pain,  redness, or drainage from the incision -- Difficulty swallowing or breathing -- Muscle cramping or spasms -- Numbness or tingling in hands or around lips  The clinic staff is available to answer your questions during regular business hours.  Please don't hesitate to call and ask to speak to one of the nurses if you have concerns.  Armandina Gemma, MD [facility] Office: [phone]   Ice pack   Complete by: As directed    Increase activity slowly   Complete by: As directed    No dressing needed   Complete by: As directed      Allergies as of 06/23/2019      Reactions   Codeine Hives   Morphine And Related Itching, Nausea And Vomiting   Shellfish Allergy Hives, Swelling      Medication List    TAKE these medications   amLODipine 2.5 MG tablet Commonly known as: NORVASC Take 5 mg by mouth daily.   aspirin 325 MG tablet Take 325 mg by mouth daily.   cetirizine 10 MG tablet Commonly known as: ZYRTEC Take 10 mg by mouth daily as needed for allergies.   fluticasone 50 MCG/ACT nasal spray Commonly known as: FLONASE Place 1 spray into both nostrils daily as needed for allergies or rhinitis.   lamoTRIgine 25 MG tablet Commonly known as: LAMICTAL Take 25 mg by mouth at bedtime.   lidocaine 5 % Commonly known as: LIDODERM Place 2 patches onto the skin daily.   mirtazapine 45 MG tablet Commonly known as: REMERON Take 45 mg by mouth at bedtime.   Oxycodone HCl 10 MG Tabs Take 10 mg by mouth 4 (four) times daily as needed (pain). What changed: Another medication with the same name was added. Make sure you understand how and when to take each.   oxyCODONE 5 MG immediate release tablet Commonly known as: Oxy IR/ROXICODONE Take 1-2 tablets (5-10 mg total) by mouth every 4 (four) hours as needed for moderate pain. What changed: You were already taking a medication with the same name, and this prescription was added. Make sure you understand how and when to  take each.   pantoprazole 40 MG tablet Commonly known as: PROTONIX Take 40 mg by mouth 2 (two) times daily.   pregabalin 25 MG capsule Commonly known as: LYRICA Take 25 mg by mouth 3 (three) times daily as needed (neuropathy).   tiZANidine 4 MG tablet Commonly known as: ZANAFLEX Take 4 mg by mouth 2 (two) times daily as needed for muscle spasms.   Xtampza ER 9 MG C12a Generic drug: oxyCODONE ER Take 9 mg by mouth in the morning and at bedtime.   zolpidem 10 MG tablet Commonly known as: AMBIEN Take 10 mg by mouth at bedtime as needed for sleep.        Earnstine Regal, MD, [facility] Office: [phone]   Signed: Armandina Gemma 06/23/2019, 8:42 AM

## 2019-06-25 LAB — SURGICAL PATHOLOGY

## 2019-06-28 ENCOUNTER — Emergency Department (HOSPITAL_COMMUNITY): Payer: Medicare Other

## 2019-06-28 ENCOUNTER — Other Ambulatory Visit: Payer: Self-pay

## 2019-06-28 ENCOUNTER — Emergency Department (HOSPITAL_COMMUNITY)
Admission: EM | Admit: 2019-06-28 | Discharge: 2019-06-28 | Disposition: A | Discharge status: Home / Self Care | Payer: Medicare Other | Attending: Emergency Medicine | Admitting: Emergency Medicine

## 2019-06-28 DIAGNOSIS — Z7982 Long term (current) use of aspirin: Secondary | ICD-10-CM | POA: Diagnosis not present

## 2019-06-28 DIAGNOSIS — Z8673 Personal history of transient ischemic attack (TIA), and cerebral infarction without residual deficits: Secondary | ICD-10-CM | POA: Diagnosis not present

## 2019-06-28 DIAGNOSIS — M62838 Other muscle spasm: Secondary | ICD-10-CM | POA: Diagnosis not present

## 2019-06-28 DIAGNOSIS — M542 Cervicalgia: Secondary | ICD-10-CM | POA: Diagnosis present

## 2019-06-28 DIAGNOSIS — I129 Hypertensive chronic kidney disease with stage 1 through stage 4 chronic kidney disease, or unspecified chronic kidney disease: Secondary | ICD-10-CM | POA: Diagnosis not present

## 2019-06-28 DIAGNOSIS — N182 Chronic kidney disease, stage 2 (mild): Secondary | ICD-10-CM | POA: Insufficient documentation

## 2019-06-28 DIAGNOSIS — Z87891 Personal history of nicotine dependence: Secondary | ICD-10-CM | POA: Insufficient documentation

## 2019-06-28 DIAGNOSIS — Z79899 Other long term (current) drug therapy: Secondary | ICD-10-CM | POA: Diagnosis not present

## 2019-06-28 DIAGNOSIS — G8918 Other acute postprocedural pain: Secondary | ICD-10-CM | POA: Diagnosis not present

## 2019-06-28 LAB — COMPREHENSIVE METABOLIC PANEL
ALT: 11 U/L (ref 0–44)
AST: 26 U/L (ref 15–41)
Albumin: 3.9 g/dL (ref 3.5–5.0)
Alkaline Phosphatase: 86 U/L (ref 38–126)
Anion gap: 12 (ref 5–15)
BUN: 11 mg/dL (ref 8–23)
CO2: 26 mmol/L (ref 22–32)
Calcium: 9.2 mg/dL (ref 8.9–10.3)
Chloride: 101 mmol/L (ref 98–111)
Creatinine, Ser: 1.34 mg/dL — ABNORMAL HIGH (ref 0.44–1.00)
GFR calc Af Amer: 49 mL/min — ABNORMAL LOW (ref >60)
GFR calc non Af Amer: 43 mL/min — ABNORMAL LOW (ref >60)
Glucose, Bld: 123 mg/dL — ABNORMAL HIGH (ref 70–99)
Potassium: 3.5 mmol/L (ref 3.5–5.1)
Sodium: 139 mmol/L (ref 135–145)
Total Bilirubin: 1.2 mg/dL (ref 0.3–1.2)
Total Protein: 9.2 g/dL — ABNORMAL HIGH (ref 6.5–8.1)

## 2019-06-28 LAB — CBC WITH DIFFERENTIAL/PLATELET
Abs Immature Granulocytes: 0.03 10*3/uL (ref 0.00–0.07)
Basophils Absolute: 0 10*3/uL (ref 0.0–0.1)
Basophils Relative: 0 %
Eosinophils Absolute: 0 10*3/uL (ref 0.0–0.5)
Eosinophils Relative: 0 %
HCT: 39.1 % (ref 36.0–46.0)
Hemoglobin: 12 g/dL (ref 12.0–15.0)
Immature Granulocytes: 0 %
Lymphocytes Relative: 9 %
Lymphs Abs: 1.1 10*3/uL (ref 0.7–4.0)
MCH: 26.4 pg (ref 26.0–34.0)
MCHC: 30.7 g/dL (ref 30.0–36.0)
MCV: 85.9 fL (ref 80.0–100.0)
Monocytes Absolute: 0.8 10*3/uL (ref 0.1–1.0)
Monocytes Relative: 7 %
Neutro Abs: 10.6 10*3/uL — ABNORMAL HIGH (ref 1.7–7.7)
Neutrophils Relative %: 84 %
Platelets: 321 10*3/uL (ref 150–400)
RBC: 4.55 MIL/uL (ref 3.87–5.11)
RDW: 13.9 % (ref 11.5–15.5)
WBC: 12.7 10*3/uL — ABNORMAL HIGH (ref 4.0–10.5)
nRBC: 0 % (ref 0.0–0.2)

## 2019-06-28 MED ORDER — METHOCARBAMOL 500 MG PO TABS
750.0000 mg | ORAL_TABLET | Freq: Once | ORAL | Status: AC
  Administered 2019-06-28: 750 mg via ORAL
  Filled 2019-06-28: qty 2

## 2019-06-28 MED ORDER — FENTANYL CITRATE (PF) 100 MCG/2ML IJ SOLN
100.0000 ug | Freq: Once | INTRAMUSCULAR | Status: AC
  Administered 2019-06-28: 100 ug via INTRAVENOUS
  Filled 2019-06-28: qty 2

## 2019-06-28 MED ORDER — METHOCARBAMOL 750 MG PO TABS: 750.0000 mg | ORAL_TABLET | Freq: Three times a day (TID) | ORAL | 0 refills | Status: DC | PRN

## 2019-06-28 MED ORDER — SODIUM CHLORIDE 0.9 % IV SOLN: Freq: Once | INTRAVENOUS | Status: AC

## 2019-06-28 MED ORDER — FENTANYL CITRATE (PF) 100 MCG/2ML IJ SOLN
50.0000 ug | Freq: Once | INTRAMUSCULAR | Status: AC
  Administered 2019-06-28: 50 ug via INTRAVENOUS
  Filled 2019-06-28: qty 2

## 2019-06-28 NOTE — ED Notes (Signed)
Purewick applied to pt °

## 2019-06-28 NOTE — ED Provider Notes (Signed)
[facility] DEPT Provider Note   CSN: [id number] Arrival date & time: 06/28/19  0746     History Chief Complaint  Patient presents with  . Neck Pain    Sierra Krueger is a 64 y.o. female.  The history is provided by the patient and medical records. No language interpreter was used.  Neck Pain  Sierra Krueger is a 64 y.o. female who presents to the Emergency Department complaining of neck pain. She is status post neck exploration with para thyroidectomy on February 26. She was doing well following hospital discharge. Four days ago she started to develop pain to the neck bilaterally that she describes as a soreness. She has been treating it with her home pain medications as well as muscle relaxants, heating pads and cool compresses. Pain is worse with movement. She denies any fevers, difficulty swallowing, difficulty breathing, shortness of breath, nausea, vomiting. Her last dose of pain medication was last night. She took a long acting oxycodone, which is one of her long-standing prescriptions as well as a tizanidine. She feels like her pain medications are not touching her pain. She is unsure if there is any local swelling. She has not taken any pain medication today.    Past Medical History:  Diagnosis Date  . Ambulates with cane   . Anxiety   . CKD (chronic kidney disease), stage II   . Decreased peripheral vision of both eyes    left side due to CVA 2007  . History of CVA with residual deficit 12/2005   large right PCA territory subacute infarct without hemorrhage w/ left sided upper/lower extremity weakness and left peripheral vision loss  . Hypertension   . Left-sided weakness    upper/ lower extremity,  CVA residual 2007  . Renal calculus, bilateral   . Stroke ([place]) 2007   lt side weakness.  . Wears partial dentures    upper    Patient Active Problem List   Diagnosis Date Noted  . Primary hyperparathyroidism ([place]) 06/22/2019  .  Hyperparathyroidism, primary ([place]) 06/20/2019  . Renal calculus, left 04/24/2018  . Pain in left hip 08/11/2017  . Trochanteric bursitis, left hip 05/18/2016    Past Surgical History:  Procedure Laterality Date  . BREAST LUMPECTOMY Left 1997   benign tumor   . CYSTOSCOPY/URETEROSCOPY/HOLMIUM LASER/STENT PLACEMENT Bilateral 05/15/2018   Procedure: CYSTOSCOPY/RETROGRADE/URETEROSCOPY/HOLMIUM LASER/BASKET STONE EXTRACTION,  LEFT STENT REPLACEMENT, RIGHT STENT PLACEMENT;  Surgeon: Kathie Rhodes, MD;  Location: [facility];  Service: Urology;  Laterality: Bilateral;  . FOOT SURGERY Left 2010  . HOLMIUM LASER APPLICATION Bilateral [id number]   Procedure: HOLMIUM LASER APPLICATION;  Surgeon: Kathie Rhodes, MD;  Location: [facility];  Service: Urology;  Laterality: Bilateral;  . IR URETERAL STENT LEFT NEW ACCESS W/O SEP NEPHROSTOMY CATH  04/24/2018  . LUMBAR SPINE SURGERY  2002   L3 -- 5  . NEPHROLITHOTOMY Left 04/24/2018   Procedure: NEPHROLITHOTOMY PERCUTANEOUS;  Surgeon: Kathie Rhodes, MD;  Location: [facility];  Service: Urology;  Laterality: Left;  . PARATHYROIDECTOMY N/A 06/22/2019   Procedure: NECK EXPLORATION WITH PARATHYROIDECTOMY WITH PARATHYROID BIOPSY;  Surgeon: Armandina Gemma, MD;  Location: [facility];  Service: General;  Laterality: N/A;  . TROCHANTERIC BURSA EXCISION Left 09-02-2016    dr Ninfa Linden  @[facility]      OB History   No obstetric history on file.     No family history on file.  Social History   Tobacco Use  . Smoking status: Former Smoker  Years: 10.00    Types: Cigarettes    Quit date: 11/01/2008    Years since quitting: 10.6  . Smokeless tobacco: Never Used  Substance Use Topics  . Alcohol use: Not Currently  . Drug use: No    Home Medications Prior to Admission medications   Medication Sig Start Date End Date Taking? Authorizing Provider  amLODipine (NORVASC) 2.5 MG tablet Take 5 mg by mouth daily.  02/18/19  Yes [provider]   aspirin 325 MG tablet Take 325 mg by mouth daily.   Yes [provider]  cetirizine (ZYRTEC) 10 MG tablet Take 10 mg by mouth daily as needed for allergies.   Yes [provider]  fluticasone (FLONASE) 50 MCG/ACT nasal spray Place 1 spray into both nostrils daily as needed for allergies or rhinitis.   Yes [provider]  lamoTRIgine (LAMICTAL) 25 MG tablet Take 25 mg by mouth at bedtime. 05/24/19  Yes [provider]  lidocaine (LIDODERM) 5 % Place 2 patches onto the skin daily.  05/21/19  Yes [provider]  mirtazapine (REMERON) 45 MG tablet Take 45 mg by mouth at bedtime.  05/21/19  Yes [provider]  oxyCODONE ER (XTAMPZA ER) 9 MG C12A Take 9 mg by mouth in the morning and at bedtime.   Yes [provider]  pantoprazole (PROTONIX) 40 MG tablet Take 80 mg by mouth daily.    Yes [provider]  pregabalin (LYRICA) 25 MG capsule Take 25 mg by mouth 3 (three) times daily as needed (neuropathy).  05/21/19  Yes [provider]  zolpidem (AMBIEN) 10 MG tablet Take 10 mg by mouth at bedtime as needed for sleep.   Yes [provider]  methocarbamol (ROBAXIN) 750 MG tablet Take 1 tablet (750 mg total) by mouth every 8 (eight) hours as needed for muscle spasms. 06/28/19   Quintella Reichert, MD  oxyCODONE (OXY IR/ROXICODONE) 5 MG immediate release tablet Take 1-2 tablets (5-10 mg total) by mouth every 4 (four) hours as needed for moderate pain. 06/23/19   Armandina Gemma, MD    Allergies    Codeine, Morphine and related, and Shellfish allergy  Review of Systems   Review of Systems  Musculoskeletal: Positive for neck pain.  All other systems reviewed and are negative.   Physical Exam Updated Vital Signs BP (!) 154/83   Pulse (!) 113   Temp 98.4 F (36.9 C) (Oral)   Resp 15   Ht 5\' 4"  (1.626 m)   Wt 73.9 kg   SpO2 94%   BMI 27.98 kg/m   Physical Exam Vitals and nursing note reviewed.  Constitutional:       Appearance: She is well-developed.  HENT:     Head: Normocephalic and atraumatic.  Neck:     Comments: Anterior neck surgical incision site clean, dry, intact. There is no significant swelling or lymphadenopathy to the neck. Patient is unable to range her neck. She does have tenderness to palpation over the bilateral and posterior neck. Position of comfort is with the neck slightly rotated to the right. Cardiovascular:     Rate and Rhythm: Regular rhythm. Tachycardia present.     Heart sounds: No murmur.  Pulmonary:     Effort: Pulmonary effort is normal. No respiratory distress.     Breath sounds: Normal breath sounds.  Abdominal:     Palpations: Abdomen is soft.     Tenderness: There is no abdominal tenderness. There is no guarding or rebound.  Musculoskeletal:  General: No swelling or tenderness.  Skin:    General: Skin is warm and dry.  Neurological:     Mental Status: She is alert and oriented to person, place, and time.     Comments: Generalized weakness.  Psychiatric:        Behavior: Behavior normal.     ED Results / Procedures / Treatments   Labs (all labs ordered are listed, but only abnormal results are displayed) Labs Reviewed  COMPREHENSIVE METABOLIC PANEL - Abnormal; Notable for the following components:      Result Value   Glucose, Bld 123 (*)    Creatinine, Ser 1.34 (*)    Total Protein 9.2 (*)    GFR calc non Af Amer 43 (*)    GFR calc Af Amer 49 (*)    All other components within normal limits  CBC WITH DIFFERENTIAL/PLATELET - Abnormal; Notable for the following components:   WBC 12.7 (*)    Neutro Abs 10.6 (*)    All other components within normal limits    EKG None  Radiology US SOFT TISSUE HEAD & NECK (NON-THYROID)  Result Date: 06/28/2019 CLINICAL DATA:  64 year old with left neck pain. Recent parathyroidectomy. Evaluate for hematoma. EXAM: ULTRASOUND OF HEAD/NECK SOFT TISSUES TECHNIQUE: Ultrasound examination of the head and neck soft  tissues was performed in the area of clinical concern. COMPARISON:  Neck CT 12/26/2018 FINDINGS: No evidence for a solid or cystic lesion at the area of concern. Specifically, there is no evidence for a hematoma formation. There are small lymph nodes scattered throughout the visualized left neck. IMPRESSION: No suspicious solid or cystic lesions at the area of concern in the left neck. Specifically, no evidence for a large hematoma. Small lymph nodes on left side of the neck are nonspecific and could be reactive. Electronically Signed   By: Markus Daft M.D.   On: 06/28/2019 13:45    Procedures Procedures (including critical care time)  Medications Ordered in ED Medications  0.9 %  sodium chloride infusion ( Intravenous New Bag/Given 06/28/19 1022)  methocarbamol (ROBAXIN) tablet 750 mg (750 mg Oral Given 06/28/19 0943)  fentaNYL (SUBLIMAZE) injection 50 mcg (50 mcg Intravenous Given 06/28/19 1016)  fentaNYL (SUBLIMAZE) injection 100 mcg (100 mcg Intravenous Given 06/28/19 1308)    ED Course  I have reviewed the triage vital signs and the nursing notes.  Pertinent labs & imaging results that were available during my care of the patient were reviewed by me and considered in my medical decision making (see chart for details).    MDM Rules/Calculators/A&P                     Patient with recent para thyroidectomy here for evaluation of increasing pain in her neck. She is non-toxic appearing on evaluation with no respiratory distress. Examination demonstrates healing post surgical site. She was treated with pain control and muscle relaxants. She was evaluated by general surgery in the emergency department with plan to treat for muscle spasms. Of note she is tachycardic during her ED stay but was noted to be tachycardic prior to surgery. Presentation is not consistent with sepsis, PE, ACS, CHF.  Final Clinical Impression(s) / ED Diagnoses Final diagnoses:  Acute post-operative pain  Muscle spasm    Rx  / DC Orders ED Discharge Orders         Ordered    methocarbamol (ROBAXIN) 750 MG tablet  Every 8 hours PRN     06/28/19 1451  Quintella Reichert, MD 06/28/19 [phone]

## 2019-06-28 NOTE — Progress Notes (Signed)
    CC: neck pain PCP:  Nolene Ebbs, MD  Pain clinic:  [facility]   Subjective: 64 y/o female presents with pain left neck, says she has had pain started Sunday 2/28 in the afternoon and has not been able to turn it since late Sunday afternoon.  Taking clears well, but has not tried anything else.  No trouble swallowing or breathing with symptoms.  On chronic pain medicines for back and hip pain.  Office instructed to try heat, with some relief, but not significant relief.  Told to come to the ED.   Currently her incision looks fine, no respiratory issues, it just hurts to move her neck and she keeps it turned to the right.    No increased back or hip pain.    Work up in the ED:  She is afebrile, BP up some 159/86 HR now around 110 range.  BMP shows glucose 128, creatinine 1.32 WBC 12.7, H/H: 12/39  Objective: Vital signs in last 24 hours: Temp:  [98.4 F (36.9 C)] 98.4 F (36.9 C) (03/04 0900) Pulse Rate:  [110-119] 111 (03/04 1027) Resp:  [15-26] 16 (03/04 1027) BP: (139-165)/(84-90) 139/84 (03/04 1024) SpO2:  [92 %-96 %] 92 % (03/04 1027) Weight:  [73.9 kg] 73.9 kg (03/04 0806)    Intake/Output from previous day: No intake/output data recorded. Intake/Output this shift: No intake/output data recorded.  General appearance: alert, cooperative and no distress Neck: no adenopathy, no carotid bruit, no JVD, supple, symmetrical, trachea midline, thyroid not enlarged, symmetric, no tenderness/mass/nodules and neck incision looks fine, no swelling , just pain with moving neck to the left.   Resp: clear to auscultation bilaterally Cardio: regular rate and rhythm, S1, S2 normal, no murmur, click, rub or gallop GI: soft, non-tender; bowel sounds normal; no masses,  no organomegaly Extremities: extremities normal, atraumatic, no cyanosis or edema Neurologic: Grossly normal  Lab Results:  Recent Labs    06/28/19 1024  WBC 12.7*  HGB 12.0  HCT 39.1  PLT 321    BMET No  results for input(s): NA, K, CL, CO2, GLUCOSE, BUN, CREATININE, CALCIUM in the last 72 hours. PT/INR No results for input(s): LABPROT, INR in the last 72 hours.  No results for input(s): AST, ALT, ALKPHOS, BILITOT, PROT, ALBUMIN in the last 168 hours.   Lipase  No results found for: LIPASE   Medications:   Assessment/Plan Chronic pain back and hip - oxycodone, gabypentin CVA 2007 - vision loss, left side weakness Hypertension Hx of nephrolithiasis   Left neck pain, does not want to turn her head to the left. Primary hyperparathyroidism/hypercalcemia S/p Neck exploration with parathyroidectomy (right superior), parathyroid biopsy (left superior)06/22/19  Dr. Armandina Gemma  Plan:  I am getting a soft tissue US to be sure we are not missing a fluid collection or hematoma, I do not see on exam.  Ultrasound shows no evidence of hematoma or suspicious solid or cystic lesions.  It is Dr. Gala Lewandowsky opinion her pain is related to muscle spasm. No surgical issues, she can follow up in the office as previously arranged.     LOS: 0 days    Sierra Krueger 06/28/2019 Please see Amion

## 2019-06-28 NOTE — ED Notes (Signed)
US at bedside

## 2019-06-28 NOTE — ED Triage Notes (Addendum)
Pt BIBA from home.   Per EMS- Pt reports thyroid surgery Friday 2/26.  Pt reports posterior cervical pain since being discharged from surgery.  Reports prescription meds aren't working. Pt reports taking one 12-hr oxycodone today appx 0630.  Aox4, ambulatory on scene.   Pt arrives to ED with blistered, red burn site to upper right shoulder area, appx size of a half dollar, from heating pad pt had been using at home.

## 2019-07-11 ENCOUNTER — Ambulatory Visit: Payer: Medicare Other

## 2019-07-17 ENCOUNTER — Telehealth (HOSPITAL_COMMUNITY): Payer: Self-pay

## 2019-07-17 NOTE — Telephone Encounter (Signed)
We received an order for patient to have arterial doppler from [facility] 06/20/19  I called patient 06/22/19, 06/28/19, 07/02/19 I spoke with Darden Dates at this office and he told me to disregard referral if patient wasn't calling back.    [facility]

## 2019-07-18 ENCOUNTER — Ambulatory Visit: Payer: Medicare Other | Attending: Student

## 2019-07-18 ENCOUNTER — Other Ambulatory Visit: Payer: Self-pay

## 2019-07-18 DIAGNOSIS — E210 Primary hyperparathyroidism: Secondary | ICD-10-CM | POA: Insufficient documentation

## 2019-07-18 DIAGNOSIS — M542 Cervicalgia: Secondary | ICD-10-CM | POA: Insufficient documentation

## 2019-07-18 DIAGNOSIS — M62838 Other muscle spasm: Secondary | ICD-10-CM | POA: Insufficient documentation

## 2019-07-18 NOTE — Patient Instructions (Signed)
Access Code: [email or web address] URL: [email or web address] Date: 07/18/2019 Prepared by: Tomma Rakers  Exercises Supine Cervical Rotation PROM - 3 x daily - 7 x weekly - 1 sets - 5 reps - stretching don't increase pain too much hold Supine Chin Tuck - 3 x daily - 7 x weekly - 1 sets - 5 reps - stretching is good don't increase pain too much hold Supine Scapular Retraction - 3 x daily - 7 x weekly - 1 sets - 5 reps

## 2019-07-18 NOTE — Therapy (Signed)
[place], [place], [address] Phone: [phone]   Fax:  [phone]  Physical Therapy Evaluation  Patient Details  Name: Sierra Krueger MRN: [id number] Date of Birth: 10/17/1955 Referring Provider (PT): Armandina Gemma MD   Encounter Date: 07/18/2019  PT End of Session - 07/18/19 1559    Visit Number  1    Number of Visits  7    Date for PT Re-Evaluation  09/12/19    PT Start Time  [id number]    PT Stop Time  1545    PT Time Calculation (min)  38 min    Activity Tolerance  Patient tolerated treatment well    Behavior During Therapy  [facility] for tasks assessed/performed       Past Medical History:  Diagnosis Date  . Ambulates with cane   . Anxiety   . CKD (chronic kidney disease), stage II   . Decreased peripheral vision of both eyes    left side due to CVA 2007  . History of CVA with residual deficit 12/2005   large right PCA territory subacute infarct without hemorrhage w/ left sided upper/lower extremity weakness and left peripheral vision loss  . Hypertension   . Left-sided weakness    upper/ lower extremity,  CVA residual 2007  . Renal calculus, bilateral   . Stroke ([place]) 2007   lt side weakness.  . Wears partial dentures    upper    Past Surgical History:  Procedure Laterality Date  . BREAST LUMPECTOMY Left 1997   benign tumor   . CYSTOSCOPY/URETEROSCOPY/HOLMIUM LASER/STENT PLACEMENT Bilateral 05/15/2018   Procedure: CYSTOSCOPY/RETROGRADE/URETEROSCOPY/HOLMIUM LASER/BASKET STONE EXTRACTION,  LEFT STENT REPLACEMENT, RIGHT STENT PLACEMENT;  Surgeon: Kathie Rhodes, MD;  [facility];  Service: Urology;  Laterality: Bilateral;  . FOOT SURGERY Left 2010  . HOLMIUM LASER APPLICATION Bilateral [id number]   Procedure: HOLMIUM LASER APPLICATION;  Surgeon: Kathie Rhodes, MD;  Location: [facility];  Service: Urology;  Laterality: Bilateral;  . IR URETERAL STENT LEFT NEW  ACCESS W/O SEP NEPHROSTOMY CATH  04/24/2018  . LUMBAR SPINE SURGERY  2002   L3 -- 5  . NEPHROLITHOTOMY Left 04/24/2018   Procedure: NEPHROLITHOTOMY PERCUTANEOUS;  Surgeon: Kathie Rhodes, MD;  Location: [facility];  Service: Urology;  Laterality: Left;  . PARATHYROIDECTOMY N/A 06/22/2019   Procedure: NECK EXPLORATION WITH PARATHYROIDECTOMY WITH PARATHYROID BIOPSY;  Surgeon: Armandina Gemma, MD;  Location: [facility];  Service: General;  Laterality: N/A;  . TROCHANTERIC BURSA EXCISION Left 09-02-2016    dr Ninfa Linden  @[facility]     There were no vitals filed for this visit.   Subjective Assessment - 07/18/19 1512    Subjective  Pt states that she came home on 06/23/19 and started to feel pulling in her neck while pulling her night time meds. She states that the pain radiated up to the base of her skull and she had to lay down. She states that the next Thursday morning she was in so much pain she had her son call 911 and was taken to the ER. An ultrasound was ordered and no abnormal findings were found. She was diagnosed with acute post-operative pain/muscle spasm and she was given a new pain medication in the IV and was started on the muscle relaxer. Pt states she is able to only sit up about 15-20 minutes which is an improvement from before with the new medication. Her son still has to help her with all of her activities of daily  living.    Pertinent History  06/22/2019 exploration parathyroid surgery with parathyroidectomy, HX breast tumor benign 1997    How long can you sit comfortably?  15-20 min    How long can you stand comfortably?  10 minutes    How long can you walk comfortably?  100 feet about    Patient Stated Goals  I want to be able to get back to doing my normal routine including taking a shower, putting my clothes on, preparing my own food, walking and going out side.    Currently in Pain?  Yes    Pain Score  8     Pain Location  Neck    Pain Orientation  Posterior;Mid    Pain Descriptors / Indicators   Aching    Pain Type  Surgical pain    Pain Radiating Towards  down into both shoulders.    Pain Onset  1 to 4 weeks ago    Pain Frequency  Constant    Aggravating Factors   sitting/standing/bending    Pain Relieving Factors  rest, medication         [facility] PT Assessment - 07/18/19 0001      Assessment   Medical Diagnosis  parathyroidectomy     Referring Provider (PT)  Armandina Gemma MD    Onset Date/Surgical Date  06/22/19    Hand Dominance  Right    Next MD Visit  07/19/2019    Prior Therapy  No      Precautions   Precautions  Other (comment)    Precaution Comments  recent neck surgery no serious precautions      Balance Screen   Has the patient fallen in the past 6 months  No    Has the patient had a decrease in activity level because of a fear of falling?   Yes    Is the patient reluctant to leave their home because of a fear of falling?   Yes      [place]  Private residence    Living Arrangements  Children    Type of [place] to enter    Entrance Stairs-Number of Steps  6    Home Layout  Two level    Alternate Level Stairs-Number of Steps  10    Alternate Level Stairs-Rails  Can reach both      Prior Function   Level of Independence  Needs assistance with ADLs    Vocation  On disability    Comments  I want to get back to working with my plants       Cognition   Overall Cognitive Status  Within Functional Limits for tasks assessed      Observation/Other Assessments   Observations  pt holds her head in 4 degrees of R cervical rotation (the L side was weak from previous CVA)    Skin Integrity  incision on the anteiror neck is healing well and glue is starting to peel       ROM / Strength   AROM / PROM / Strength  AROM      AROM   AROM Assessment Site  Shoulder;Cervical    Right/Left Shoulder  Right;Left    Right Shoulder Flexion  135 Degrees   in supine with head at 45 degree elevation   Left Shoulder  Flexion  110 Degrees   in supine with head at 45 degree elevation   Cervical -  Right Rotation  14   in supine with head at 45 degree elevation   Cervical - Left Rotation  6   in supine with head at 45 degree elevation       LYMPHEDEMA/ONCOLOGY QUESTIONNAIRE - 07/18/19 1542      Surgeries   Other Surgery Date  06/22/19      What other symptoms do you have   Are you Having Heaviness or Tightness  Yes    Are you having Pain  Yes    Are you having pitting edema  No    Is it Hard or Difficult finding clothes that fit  No    Do you have infections  No    Is there Decreased scar mobility  Yes      Lymphedema Assessments   Lymphedema Assessments  Head and Neck      Head and Neck   Right Lateral Nostril at base of nose to medial tragus   11 cm    Left Lateral Nostril at base of nose to medial tragus   11 cm    Right Corner of mouth to where ear lobe meets face  9 cm    Left Corner of mouth to where ear lobe meets face  9 cm    4 cm superior to sternal notch around neck  36 cm    6 cm superior to sternal notch around neck  48.8 cm             Objective measurements completed on examination: See above findings.      [place] Adult PT Treatment/Exercise - 07/18/19 0001      Exercises   Exercises  Neck      Neck Exercises: Supine   Neck Retraction  5 reps    Neck Retraction Limitations  tactile cues at posterior neck and VC for correct movement.     Cervical Rotation  Right;Left;5 reps    Cervical Rotation Limitations  Bil rotation with slight over pressure avoiding significant increases in pain; educated stretching is okay     Other Supine Exercise  scapular retraction in supine 5x w/VC for correct movement.              PT Education - 07/18/19 1541    Education Details  Access Code: [email or web address], Pt was educated on the importance of mobility in order to help facilitate fluid flow out of the area and to prevent loss of mobility. Discussed moving without significant  increases in pain but that stretching is okay. Pt was educated on home exercise program.    Person(s) Educated  Patient    Methods  Explanation;Tactile cues;Verbal cues;Handout    Comprehension  Verbalized understanding       PT Short Term Goals - 07/18/19 1604      PT SHORT TERM GOAL #1   Title  Pt will be independent with HEP within 2 weeks in order to demonstrate autonomy of care.    Baseline  pt currently does not have an HEP    Time  2    Period  Weeks    Status  New    Target Date  08/08/19        PT Long Term Goals - 07/18/19 1605      PT LONG TERM GOAL #1   Title  Pt will demonstrate 20 degrees of R and L cervical rotation within 6 weeks in order to demonstrate improved functional ROM    Baseline  14 degrees R rotation,  6 degrees L rotation    Time  6    Period  Weeks    Status  New    Target Date  09/05/19      PT LONG TERM GOAL #2   Title  Pt will improve Bil shoulder ROM to 150 degrees Bil shoulder flexion within 6 weeks in order to demonstrate improved functional ROM.    Baseline  L shoulder flexion 110 degrees, R shoulder flexion 135 degrees    Time  6    Period  Weeks    Status  New    Target Date  09/12/19      PT LONG TERM GOAL #3   Title  Pt will report 50% improvement in pain/function and performing ADL's in order to demonstrate subjective improvement in every day activities.    Baseline  pain 8/10    Time  6    Period  Weeks    Status  New    Target Date  09/12/19      PT LONG TERM GOAL #4   Title  Pt will be able to sit up for 20 min w/o needing a rest due to cervical weakness within 6 weeks in order to improve functional activity tolerance.    Baseline  5-10 min    Time  6    Period  Weeks    Status  New    Target Date  09/12/19             Plan - 07/18/19 1600    Clinical Impression Statement  Pt presents to [facility] therapy post exploratory surgery of the parathyroid with a parathyroid biopsy. She has an incision on the  inferior/anterior cervical area that is healing well. She has significant loss of ROM in the cervical spine and in BIl shoulders w/L>R. She has difficulty sitting up for longer than 10 minutes which is why measurements were modified into the reclined position. Pt apperas to have some soft edema in the R side of the cervical spine. She will benefit from skilled physical therapy services 1x/week for 6 weeks due to transportation concerns in order to decrease risk for immobility and decrease burden on caregivers.    Personal Factors and Comorbidities  Comorbidity 1    Comorbidities  surgery    Stability/Clinical Decision Making  Stable/Uncomplicated    Clinical Decision Making  Low    Rehab Potential  Good    PT Frequency  1x / week    PT Duration  6 weeks    PT Treatment/Interventions  Cryotherapy;Iontophoresis 4mg /ml Dexamethasone;Moist Heat;Neuromuscular re-education;Therapeutic exercise;Therapeutic activities;Manual techniques    PT Next Visit Plan  very light STM, easy AA/ROM A/ROM cervical exercises/should exercises, possibly MLD and scar mobilization once healed. Discuss with pt timing herself in sitting to increase endurance.    PT Home Exercise Plan  Access Code: [place]       Patient will benefit from skilled therapeutic intervention in order to improve the following deficits and impairments:  Decreased activity tolerance, Pain, Increased edema, Decreased range of motion  Visit Diagnosis: Primary hyperparathyroidism ([place])  Cervicalgia  Painful cervical ROM  Other muscle spasm     Problem List Patient Active Problem List   Diagnosis Date Noted  . Primary hyperparathyroidism ([place]) 06/22/2019  . Hyperparathyroidism, primary ([place]) 06/20/2019  . Renal calculus, left 04/24/2018  . Pain in left hip 08/11/2017  . Trochanteric bursitis, left hip 05/18/2016    Ander Purpura, PT 07/18/2019, 4:13 PM  [facility]  [place], [place], [address] Phone: [phone]   Fax:  [phone]  Name: Shanitha Bartnik MRN: [id number] Date of Birth: January 13, 1956

## 2019-07-26 ENCOUNTER — Ambulatory Visit: Payer: Medicare Other

## 2019-07-26 ENCOUNTER — Telehealth: Payer: Self-pay | Admitting: Internal Medicine

## 2019-07-26 NOTE — Telephone Encounter (Signed)
   Hadden Perryman DOB: 07/19/1955 MRN: [id number]   RIDER WAIVER AND RELEASE OF LIABILITY  For purposes of improving physical access to our facilities, [facility] is pleased to partner with third parties to provide [facility] patients or other authorized individuals the option of convenient, on-demand ground transportation services (the [place]") through use of the technology service that enables users to request on-demand ground transportation from independent third-party providers.  By opting to use and accept these [employer], I, the undersigned, hereby agree on behalf of myself, and on behalf of any minor child using the [employer] for whom I am the parent or legal guardian, as follows:  1. Government social research officer provided to me are provided by independent third-party transportation providers who are not [employer] or employees and who are unaffiliated with [employer]. 2. [place] is neither a transportation carrier nor a common or public carrier. 3. [facility] has no control over the quality or safety of the transportation that occurs as a result of the [employer]. 4. [facility] cannot guarantee that any third-party transportation provider will complete any arranged transportation service. 5. [facility] makes no representation, warranty, or guarantee regarding the reliability, timeliness, quality, safety, suitability, or availability of any of the Transport Services or that they will be error free. 6. I fully understand that traveling by vehicle involves risks and dangers of serious bodily injury, including permanent disability, paralysis, and death. I agree, on behalf of myself and on behalf of any minor child using the Transport Services for whom I am the parent or legal guardian, that the entire risk arising out of my use of the [employer] remains solely with me, to the maximum extent permitted under applicable law. 7. The [employer]  are provided "as is" and "as available." [facility] disclaims all representations and warranties, express, implied or statutory, not expressly set out in these terms, including the implied warranties of merchantability and fitness for a particular purpose. 8. I hereby waive and release [facility], its agents, employees, officers, directors, representatives, insurers, attorneys, assigns, successors, subsidiaries, and affiliates from any and all past, present, or future claims, demands, liabilities, actions, causes of action, or suits of any kind directly or indirectly arising from acceptance and use of the [employer]. 9. I further waive and release [facility] and its affiliates from all present and future liability and responsibility for any injury or death to persons or damages to property caused by or related to the use of the [employer]. 10. I have read this Waiver and Release of Liability, and I understand the terms used in it and their legal significance. This Waiver is freely and voluntarily given with the understanding that my right (as well as the right of any minor child for whom I am the parent or legal guardian using the [employer]) to legal recourse against [facility] in connection with the [employer] is knowingly surrendered in return for use of these services.   I attest that I read the consent document to [name], gave Ms. Ringold the opportunity to ask questions and answered the questions asked (if any). I affirm that [name] then provided consent for she's participation in this program.     Legrand Pitts

## 2019-08-02 ENCOUNTER — Ambulatory Visit: Payer: Medicare Other

## 2019-08-08 ENCOUNTER — Ambulatory Visit: Payer: Medicare Other

## 2019-08-23 IMAGING — XA IR URETURAL STENT LEFT NEW ACCESS W/O SEP NEPHROSTOMY CATH
3 series · 6 of 6 positions shown · non-contrast
Comparison: CT of the abdomen and pelvis-01/21/2019

INDICATION: Renal stones, access for left sided percutaneous nephrolithotomy.

EXAM:
1. FLUOROSCOPIC GUIDANCE FOR PUNCTURE OF THE LEFT SIDED RENAL
COLLECTING SYSTEM.
2. FLUOROSCOPIC GUIDED PLACEMENT OF A LEFT SIDED NEPHROURETERAL
CATHETER.

[Series 1: care single · 1 of 1 slices shown (1 of 2)]
[im 1/1]
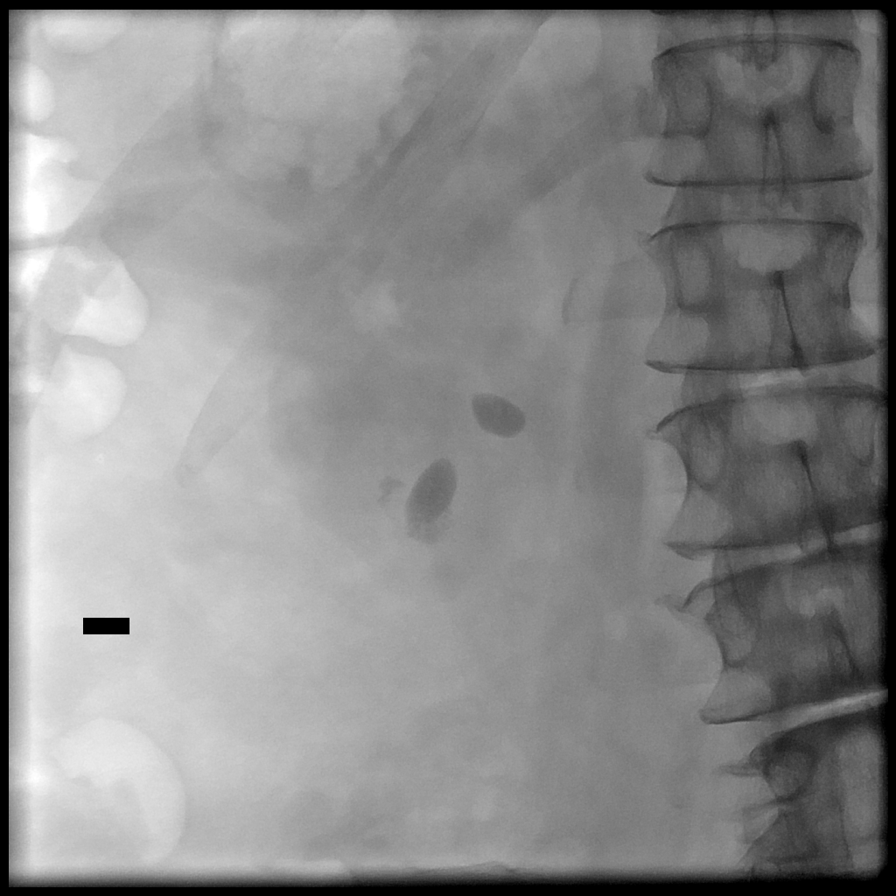

[Series 5: care single · 1 of 1 slices shown (2 of 2)]
[im 1/1]
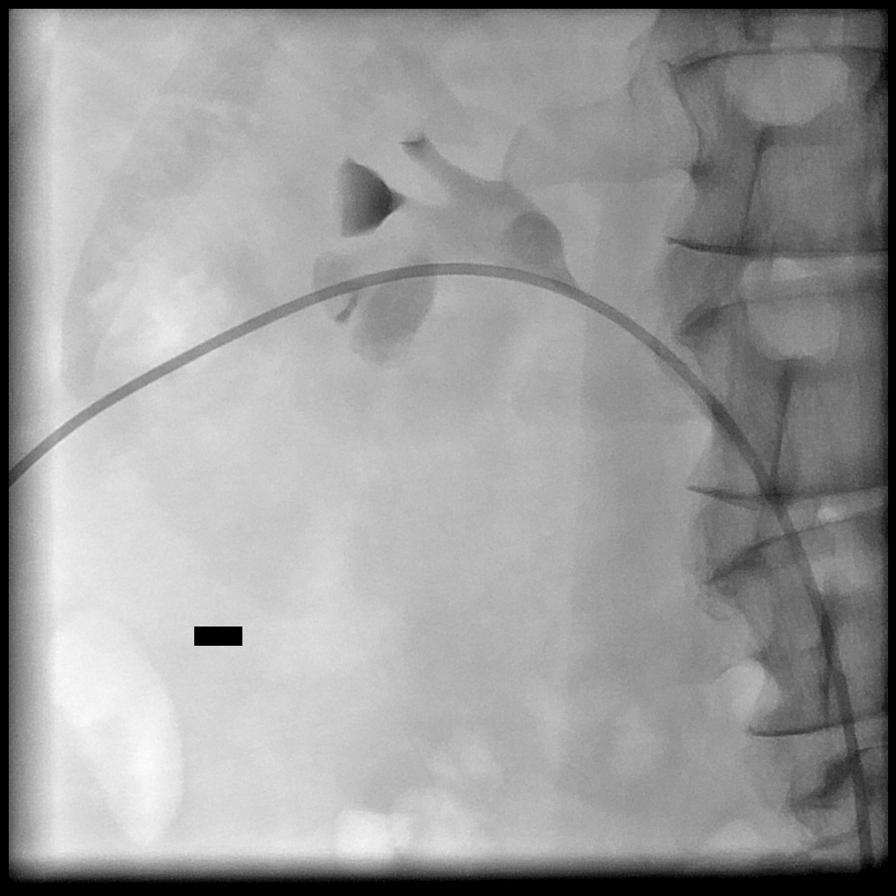

[Series 300: ir ureteral stent left new access w/o se · non-contrast · 4 of 4 slices shown]
[im 1/4]
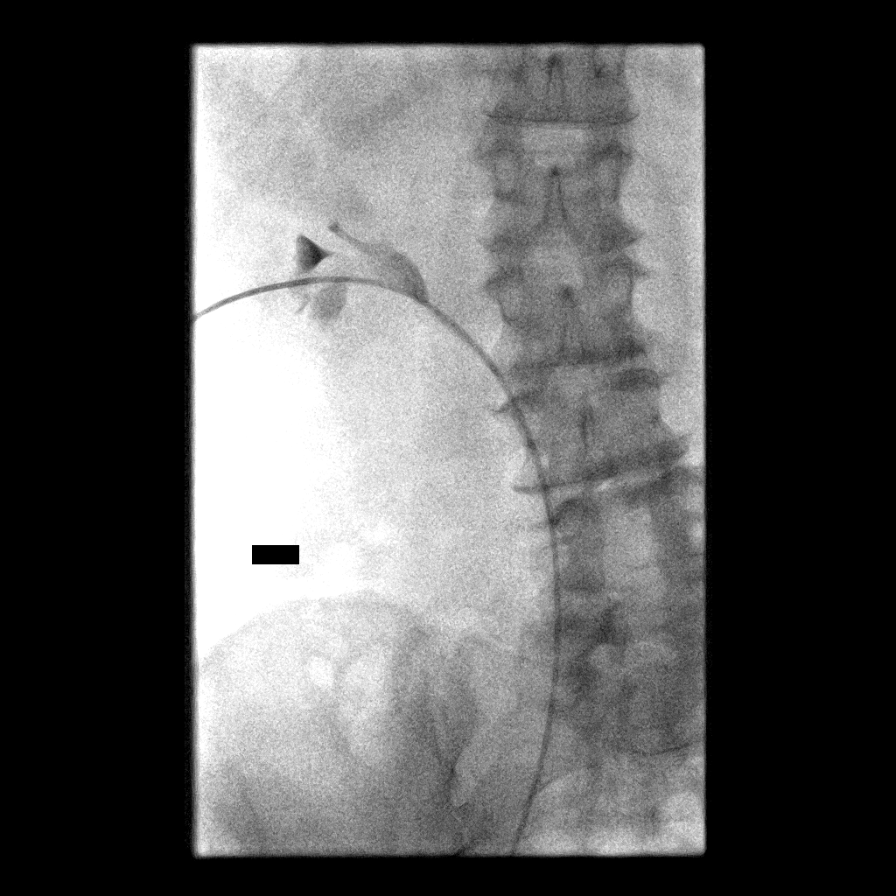
[im 2/4]
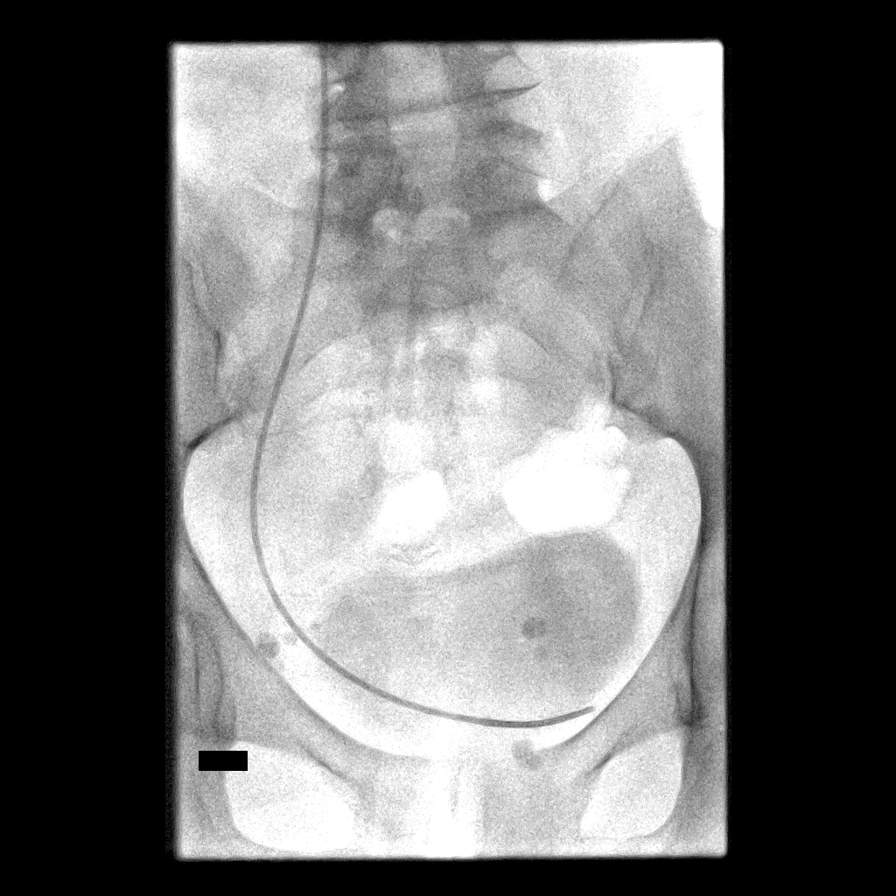
[im 3/4]
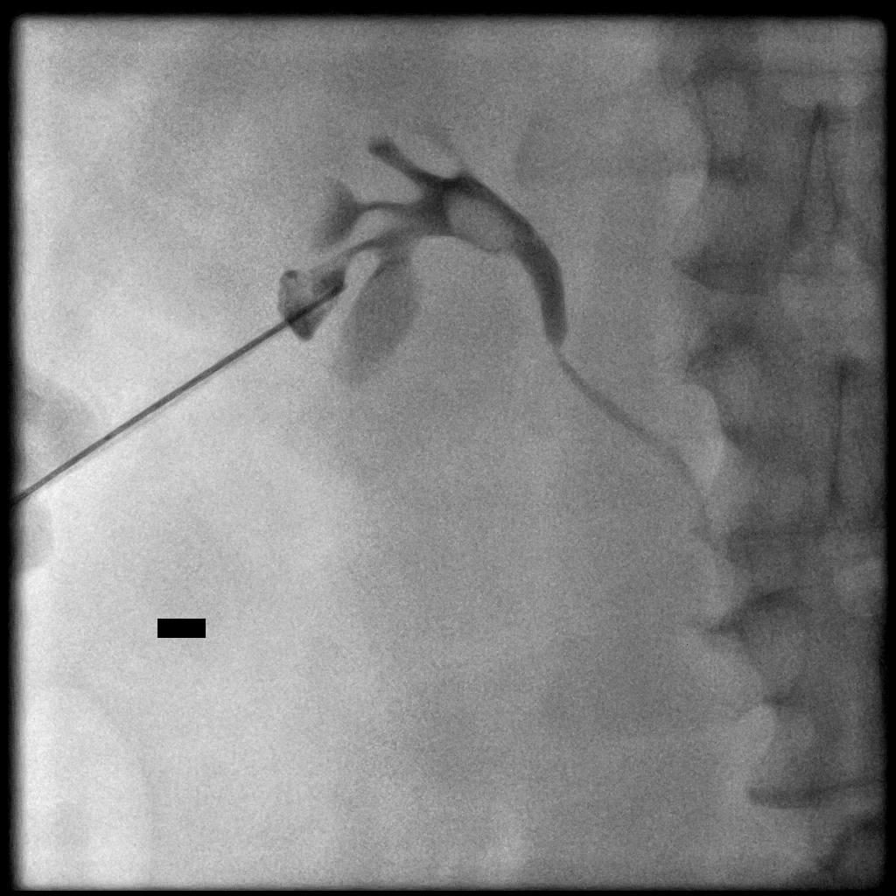
[im 4/4]
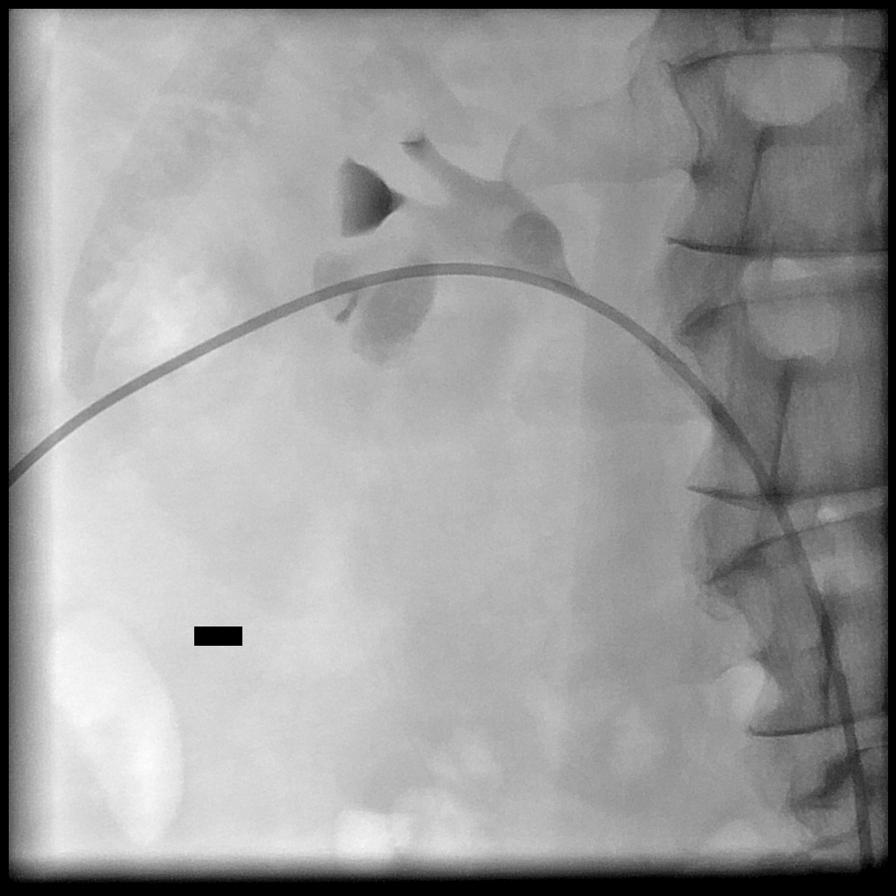

[6 of 6 positions shown; findings below may reference images not displayed]

MEDICATIONS:
Ancef 2 gm IV; The antibiotic was administered in an appropriate
time frame prior to skin puncture.

ANESTHESIA/SEDATION:
Moderate (conscious) sedation was employed during this procedure. A
total of Versed 4 mg and Fentanyl 100 mcg was administered
intravenously.

Moderate Sedation Time: 12 minutes. The patient's level of
consciousness and vital signs were monitored continuously by
radiology nursing throughout the procedure under my direct
supervision.

CONTRAST:  10 cc [id number]-077 - administered into the right renal
collecting system.

FLUOROSCOPY TIME:  2 minutes, 30 seconds (32 mGy)

COMPLICATIONS:
None immediate.

PROCEDURE:
Informed written consent was obtained from the patient after a
discussion of the risks, benefits, and alternatives to treatment.
The left flank region was prepped with Betadine in a sterile
fashion, and a sterile drape was applied covering the operative
field. A sterile gown and sterile gloves were used for the
procedure. A timeout was performed prior to the initiation of the
procedure.

A pre procedural spot fluoroscopic image was obtained of the upper
abdomen.

The stone containing posterior inferior calyx within the left kidney
was targeted fluoroscopically with a 22 gauge Chiba needle after the
overlying soft tissues were anesthetized with 1% lidocaine with
epinephrine. Access to the calyx was confirmed with limited contrast
injection and advancement of a Nitrex wire through the calyx, beyond
the pelvic stone to the level the superior aspect of left ureter.

An Accustick set was utilized to dilate the tract and was
subsequently exchanged for a Kumpe catheter over a Bentson wire. The
Kumpe catheter was advanced down the ureter and into the urinary
bladder.

Postprocedural spot radiographs were obtained in various obliquities
and the catheter was sutured to the skin. The catheter was capped
and a dressing was placed.

The patient tolerated the procedure well without immediate post
procedural complication.
FINDINGS: Pre procedural spot radiographic images demonstrates grossly
unchanged size and positioning of known left pelvic stone measuring
approximately 1.1 x 0.7 cm, additional dominant stone overlying the
anterior inferior calyx of the left kidney measuring approximately
1.4 x 0.8 cm and known ill-defined approximately 0.5 x 0.3 cm stone
overlying the posterior inferior calyx of the left kidney as
demonstrated on preceding CT scan of the abdomen and pelvis
performed 01/20/2018.

The ill-defined approximately 0.5 cm stone within the posterior
inferior calyx was targeted fluoroscopically ultimately allowing
access to left renal collecting system and placement of a Kumpe
catheter via the accessed stone containing calyx and beyond the
pelvic stone with tip terminating within the urinary bladder.
IMPRESSION: Successful fluoroscopic guided placement of a left sided 5 French
Kumpe catheter to the level of the urinary bladder to be utilized
during impending nephrolithotomy procedure.

## 2019-09-10 ENCOUNTER — Telehealth: Payer: Self-pay | Admitting: *Deleted

## 2019-09-10 NOTE — Telephone Encounter (Signed)
Pt states she has been a pt with our center and she thinks we can help her.

## 2019-09-10 NOTE — Telephone Encounter (Signed)
Left message informing pt I had received her message that she had been a pt with our office before and had thought we could help and I encouraged her to make an appt for follow up with Dr. Cannon Kettle who she had seen previously.

## 2019-11-20 ENCOUNTER — Other Ambulatory Visit: Payer: Self-pay

## 2019-11-20 ENCOUNTER — Encounter: Payer: Self-pay | Admitting: Family Medicine

## 2019-11-20 ENCOUNTER — Ambulatory Visit (INDEPENDENT_AMBULATORY_CARE_PROVIDER_SITE_OTHER): Payer: Medicare Other | Admitting: Family Medicine

## 2019-11-20 DIAGNOSIS — M25552 Pain in left hip: Secondary | ICD-10-CM | POA: Diagnosis not present

## 2019-11-20 NOTE — Progress Notes (Signed)
Office Visit Note   Patient: Sierra Krueger           Date of Birth: 1955/09/07           MRN: [id number] Visit Date: 11/20/2019 Requested by: Nolene Ebbs, MD [address] [place],  [place] [address] PCP: Nolene Ebbs, MD  Subjective: Chief Complaint  Patient presents with  . Left Hip - Pain    Pain has worsened since last week in the lateral hip. Her normal pain meds are not helping this pain. Ambulates with cane.    HPI: She is here with left hip pain.  Longstanding problems with her hip.  She is status post trochanteric bursectomy in 2018.  She had an injection in 2019 which gave her some relief.  She is under pain management taking oxycodone.  Lately she has had a flareup of pain that does not seem to be responding.               ROS:   All other systems were reviewed and are negative.  Objective: Vital Signs: There were no vitals taken for this visit.  Physical Exam:  General:  Alert and oriented, in no acute distress. Pulm:  Breathing unlabored. Psy:  Normal mood, congruent affect. Skin: Well-healed surgical scar, no erythema. Left hip: She has good range of motion still with passive movement.  She does have some pain with internal rotation.  She is point tender over the greater trochanter.  No pain with resisted strength testing.  Imaging: No results found.  Assessment & Plan: 1.  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  If this does not give long-term relief, could contemplate dextrose prolotherapy.     Procedures: Left hip injection: After sterile prep with Betadine, injected 5 cc 1% lidocaine without epinephrine and 40 mg methylprednisolone using a 22-gauge spinal needle passing the needle into the area of maximum tenderness at the greater trochanter.   PMFS History: Patient Active Problem List   Diagnosis Date Noted  . Primary hyperparathyroidism ([place])  06/22/2019  . Hyperparathyroidism, primary ([place]) 06/20/2019  . Renal calculus, left 04/24/2018  . Pain in left hip 08/11/2017  . Trochanteric bursitis, left hip 05/18/2016   Past Medical History:  Diagnosis Date  . Ambulates with cane   . Anxiety   . CKD (chronic kidney disease), stage II   . Decreased peripheral vision of both eyes    left side due to CVA 2007  . History of CVA with residual deficit 12/2005   large right PCA territory subacute infarct without hemorrhage w/ left sided upper/lower extremity weakness and left peripheral vision loss  . Hypertension   . Left-sided weakness    upper/ lower extremity,  CVA residual 2007  . Renal calculus, bilateral   . Stroke ([place]) 2007   lt side weakness.  . Wears partial dentures    upper    History reviewed. No pertinent family history.  Past Surgical History:  Procedure Laterality Date  . BREAST LUMPECTOMY Left 1997   benign tumor   . CYSTOSCOPY/URETEROSCOPY/HOLMIUM LASER/STENT PLACEMENT Bilateral 05/15/2018   Procedure: CYSTOSCOPY/RETROGRADE/URETEROSCOPY/HOLMIUM LASER/BASKET STONE EXTRACTION,  LEFT STENT REPLACEMENT, RIGHT STENT PLACEMENT;  Surgeon: Kathie Rhodes, MD;  [facility];  Service: Urology;  Laterality: Bilateral;  . FOOT SURGERY Left 2010  . HOLMIUM LASER APPLICATION Bilateral 5/42/7062   Procedure: HOLMIUM LASER APPLICATION;  Surgeon: Kathie Rhodes, MD;  Location: [facility];  Service: Urology;  Laterality: Bilateral;  . IR URETERAL STENT LEFT NEW ACCESS W/O SEP NEPHROSTOMY CATH  04/24/2018  . LUMBAR SPINE SURGERY  2002   L3 -- 5  . NEPHROLITHOTOMY Left 04/24/2018   Procedure: NEPHROLITHOTOMY PERCUTANEOUS;  Surgeon: Kathie Rhodes, MD;  Location[facility];  Service: Urology;  Laterality: Left;  . PARATHYROIDECTOMY N/A 06/22/2019   Procedure: NECK EXPLORATION WITH PARATHYROIDECTOMY WITH PARATHYROID BIOPSY;  Surgeon: Armandina Gemma, MD;  Location[facility];  Service: General;   Laterality: N/A;  . TROCHANTERIC BURSA EXCISION Left 09-02-2016    dr Ninfa Linden  @[facility]    Social History   Occupational History    Employer: DISABLED  Tobacco Use  . Smoking status: Former Smoker    Years: 10.00    Types: Cigarettes    Quit date: 11/01/2008    Years since quitting: 11.0  . Smokeless tobacco: Never Used  Vaping Use  . Vaping Use: Never used  Substance and Sexual Activity  . Alcohol use: Not Currently  . Drug use: No  . Sexual activity: Not Currently

## 2019-12-21 ENCOUNTER — Ambulatory Visit (HOSPITAL_COMMUNITY)
Admission: EM | Admit: 2019-12-21 | Discharge: 2019-12-21 | Disposition: A | Discharge status: Other Acute Inpatient Hospital | Payer: Medicare Other

## 2019-12-21 ENCOUNTER — Other Ambulatory Visit: Payer: Self-pay

## 2019-12-21 NOTE — Care Management (Signed)
Patient wants to receive services with a psychiatrist and a therapist on an outpatient basis.  Patient was linked to [facility] walk in clinic.   Patient denies SI/HI/Psychosis/Substance Abuse.  Patient denies MSE Exam.

## 2019-12-24 ENCOUNTER — Ambulatory Visit (HOSPITAL_COMMUNITY)
Admission: EM | Admit: 2019-12-24 | Discharge: 2019-12-24 | Disposition: A | Discharge status: Home / Self Care | Payer: Medicare Other | Attending: Psychiatry | Admitting: Psychiatry

## 2019-12-24 DIAGNOSIS — G4700 Insomnia, unspecified: Secondary | ICD-10-CM

## 2019-12-24 DIAGNOSIS — Z1330 Encounter for screening examination for mental health and behavioral disorders, unspecified: Secondary | ICD-10-CM | POA: Insufficient documentation

## 2019-12-24 NOTE — ED Provider Notes (Signed)
Behavioral Health Medical Screening Exam  Sierra Krueger is a 64 y.o. female.  Patient presents reporting that she just needs help getting some good sleep.  She states that part of the issue has been that she goes to [place] for pain management and she is prescribed oxycodone 10 mg 4 times a day however the pharmacy told her that they were out of stock and that she had to get a new prescription sent in.  She contacted her pain management provider and they stated that they sent in a prescription but the other pharmacy did not have it.  I contacted the pharmacy and confirmed that the patient does have an active prescription and that they are out of stock and this is been an issue over the entire weekend at several pharmacies.  The patient reports that she goes to [facility] for psychiatry.  She states that she has been prescribed Remeron, Ambien, Seroquel, Benadryl, melatonin, Restoril and is prescribed Lyrica as well and she still has extremely poor sleep.  She states that she has had some side effects from medication such as the Restoril causing her to be dizzy and the Seroquel causing her to have nightmares.  She states that she is not followed up with a sleep specialist yet.  The patient denies any suicidal or homicidal ideations and denies any hallucinations.  The patient is informed that we feel the best thing for her to do is follow-up with a sleep specialist to have her sleep assessed more appropriately.  The patient states agreement and understanding.  She is also informed about contacting [facility] again to ensure that she is able to get her pain medication as prescribed so that she does not start having any withdrawal symptoms.  Patient also states understanding to this plan as well.  At this time the patient does not meet any inpatient psychiatric treatment criteria and is psychiatric cleared.  Total Time spent with patient: 45 minutes  Psychiatric Specialty Exam  Presentation   General Appearance:Appropriate for Environment;Casual  Eye Contact:Good  Speech:Clear and Coherent;Normal Rate  Speech Volume:Normal  Handedness:Right   Mood and Affect  Mood:Euthymic  Affect:Depressed   Thought Process  Thought Processes:Coherent  Descriptions of Associations:Intact  Orientation:Full (Time, Place and Person)  Thought Content:WDL  Hallucinations:None  Ideas of Reference:None  Suicidal Thoughts:No  Homicidal Thoughts:No   Sensorium  Memory:Immediate Good;Recent Good;Remote Good  Judgment:Good  Insight:Good   Executive Functions  Concentration:Good  Attention Span:Good  Recall:Good  Fund of Knowledge:Good  Language:Good   Psychomotor Activity  Psychomotor Activity:Normal   Assets  Assets:Communication Skills;Desire for Improvement;Financial Resources/Insurance;Housing;Social Support;Transportation   Sleep  Sleep:Poor  Number of hours: No data recorded  Physical Exam: Physical Exam Vitals and nursing note reviewed.  Constitutional:      Appearance: She is well-developed.  Cardiovascular:     Rate and Rhythm: Normal rate.  Pulmonary:     Effort: Pulmonary effort is normal.  Musculoskeletal:        General: Normal range of motion.  Skin:    General: Skin is warm.  Neurological:     Mental Status: She is alert and oriented to person, place, and time.    Review of Systems  Constitutional: Negative.   HENT: Negative.   Eyes: Negative.   Respiratory: Negative.   Cardiovascular: Negative.   Gastrointestinal: Negative.   Genitourinary: Negative.   Musculoskeletal: Negative.   Skin: Negative.   Neurological: Negative.   Endo/Heme/Allergies: Negative.   Psychiatric/Behavioral: The patient has  insomnia.    There were no vitals taken for this visit. There is no height or weight on file to calculate BMI.  Musculoskeletal: Strength & Muscle Tone: within normal limits Gait & Station: normal when using  cane Patient leans: N/A   Recommendations:  Based on my evaluation the patient does not appear to have an emergency medical condition.  Doon, FNP 12/24/2019, 2:45 PM

## 2019-12-24 NOTE — BH Assessment (Signed)
Comprehensive Clinical Assessment (CCA) Screening, Triage and Referral Note  12/24/2019 Sierra Krueger [id number] Patient was seen this date as a walk in at [facility]. Patient denied any S/I, H/I or AVH. Patient reports she is prescribed multiple medications for chronic pain management issues and her pharmacy stated they did not have her Oxycodone on her refill date which was 12/12/19. Patient states since then she has experienced ongoing sleep issues reporting she only gets 2 to 3 hours a sleep per night since she has not had her medication. Patient is denying any psychiatric history or prior attempts or gestures at self harm. Patient is requesting this date assistance with resources to address ongoing sleep hygiene issues.   Patient was evaluated by Money NP who also saw patient and writes this date: Sierra Krueger is a 64 y.o. female.  Patient presents reporting that she just needs help getting some good sleep.  She states that part of the issue has been that she goes to [place] for pain management and she is prescribed oxycodone 10 mg 4 times a day however the pharmacy told her that they were out of stock and that she had to get a new prescription sent in.  She contacted her pain management provider and they stated that they sent in a prescription but the other pharmacy did not have it.  I contacted the pharmacy and confirmed that the patient does have an active prescription and that they are out of stock and this is been an issue over the entire weekend at several pharmacies.  The patient reports that she goes to [facility] for psychiatry.  She states that she has been prescribed Remeron, Ambien, Seroquel, Benadryl, melatonin, Restoril and is prescribed Lyrica as well and she still has extremely poor sleep.  She states that she has had some side effects from medication such as the Restoril causing her to be dizzy and the Seroquel causing her to have nightmares.  She states that she is not followed up with a  sleep specialist yet.  The patient denies any suicidal or homicidal ideations and denies any hallucinations.  The patient is informed that we feel the best thing for her to do is follow-up with a sleep specialist to have her sleep assessed more appropriately.  The patient states agreement and understanding.  She is also informed about contacting [facility] again to ensure that she is able to get her pain medication as prescribed so that she does not start having any withdrawal symptoms.  Patient also states understanding to this plan as well.  At this time the patient does not meet any inpatient psychiatric treatment criteria and is psychiatric cleared.  Visit Diagnosis: Insomnia    ICD-10-CM   1. Insomnia, unspecified type  G47.00     Patient Reported Information How did you hear about [place]? Self   Referral name: No data recorded  Referral phone number: No data recorded Whom do you see for routine medical problems? I don't have a doctor   Practice/Facility Name: No data recorded  Practice/Facility Phone Number: No data recorded  Name of Contact: No data recorded  Contact Number: No data recorded  Contact Fax Number: No data recorded  Prescriber Name: No data recorded  Prescriber Address (if known): No data recorded What Is the Reason for Your Visit/Call Today? Assistance with medication/s  How Long Has This Been Causing You Problems? 1 wk - 1 month  Have You Recently Been in Any Inpatient Treatment (Hospital/Detox/Crisis Center/28-Day Program)?  No   Name/Location of Program/Hospital:No data recorded  How Long Were You There? No data recorded  When Were You Discharged? No data recorded Have You Ever Received Services From [facility] Before? No   Who Do You See at [facility]? No data recorded Have You Recently Had Any Thoughts About Hurting Yourself? No   Are You Planning to Commit Suicide/Harm Yourself At This time?  No  Have you Recently Had Thoughts About [place]? No   Explanation: No data recorded Have You Used Any Alcohol or Drugs in the Past 24 Hours? No   How Long Ago Did You Use Drugs or Alcohol?  No data recorded  What Did You Use and How Much? No data recorded What Do You Feel Would Help You the Most Today? Medication  Do You Currently Have a Therapist/Psychiatrist? No   Name of Therapist/Psychiatrist: No data recorded  Have You Been Recently Discharged From Any Office Practice or Programs? No   Explanation of Discharge From Practice/Program:  No data recorded    CCA Screening Triage Referral Assessment Type of Contact: Face-to-Face   Is this Initial or Reassessment? No data recorded  Date Telepsych consult ordered in [facility]:  No data recorded  Time Telepsych consult ordered in [facility]:  No data recorded Patient Reported Information Reviewed? Yes   Patient Left Without Being Seen? No   Reason for Not Completing Assessment: No data recorded Collateral Involvement: None at this time  Does Patient Have a Court Appointed Legal Guardian? No data recorded  Name and Contact of Legal Guardian:  No data recorded If Minor and Not Living with Parent(s), Who has Custody? NA  Is CPS involved or ever been involved? Never  Is APS involved or ever been involved? Never  Patient Determined To Be At Risk for Harm To Self or Others Based on Review of Patient Reported Information or Presenting Complaint? No   Method: No data recorded  Availability of Means: No data recorded  Intent: No data recorded  Notification Required: No data recorded  Additional Information for Danger to Others Potential:  No data recorded  Additional Comments for Danger to Others Potential:  No data recorded  Are There Guns or Other Weapons in Your Home?  No data recorded   Types of Guns/Weapons: No data recorded   Are These Weapons Safely Secured?                              No data recorded   Who Could Verify You Are Able To Have These Secured:    No data  recorded Do You Have any Outstanding Charges, Pending Court Dates, Parole/Probation? No data recorded Contacted To Inform of Risk of Harm To Self or Others: Other: Comment (NA)  Location of Assessment: [facility]  Does Patient Present under Involuntary Commitment? No   IVC Papers Initial File Date: No data recorded  [place] of Residence: [place]  Patient Currently Receiving the Following Services: Medication Management   Determination of Need: No data recorded  Options For Referral: Outpatient Therapy   Mamie Nick, LCAS

## 2019-12-24 NOTE — ED Notes (Signed)
Patient discharged home. AVS/Follow-Up reviewed with patient and written copy given to patient and she verbalized understanding. All belongings returned to patient. Patient escorted to lobby to wait for daughter.

## 2020-01-07 ENCOUNTER — Other Ambulatory Visit (HOSPITAL_BASED_OUTPATIENT_CLINIC_OR_DEPARTMENT_OTHER): Payer: Self-pay

## 2020-01-07 DIAGNOSIS — G4710 Hypersomnia, unspecified: Secondary | ICD-10-CM

## 2020-01-07 DIAGNOSIS — R0681 Apnea, not elsewhere classified: Secondary | ICD-10-CM

## 2020-01-20 ENCOUNTER — Ambulatory Visit (HOSPITAL_BASED_OUTPATIENT_CLINIC_OR_DEPARTMENT_OTHER): Payer: Medicare Other | Attending: Adult Medicine | Admitting: Internal Medicine

## 2020-01-20 ENCOUNTER — Other Ambulatory Visit: Payer: Self-pay

## 2020-01-20 VITALS — Ht 64.0 in | Wt 161.0 lb

## 2020-01-20 DIAGNOSIS — R0683 Snoring: Secondary | ICD-10-CM | POA: Insufficient documentation

## 2020-01-20 DIAGNOSIS — G4700 Insomnia, unspecified: Secondary | ICD-10-CM | POA: Insufficient documentation

## 2020-01-20 DIAGNOSIS — G4710 Hypersomnia, unspecified: Secondary | ICD-10-CM

## 2020-01-21 ENCOUNTER — Encounter (HOSPITAL_BASED_OUTPATIENT_CLINIC_OR_DEPARTMENT_OTHER): Payer: Medicare Other | Admitting: Internal Medicine

## 2020-01-26 DIAGNOSIS — G4710 Hypersomnia, unspecified: Secondary | ICD-10-CM

## 2020-01-26 NOTE — Procedures (Signed)
Patient Name: Sierra Krueger, Sierra Krueger Date: 01/20/2020 Gender: Female D.O.B: October 15, 1955 Age (years): 40 Referring Provider: Vira Browns MD Height (inches): 64 Interpreting Physician: Baird Lyons MD, ABSM Weight (lbs): 161 RPSGT: Zadie Rhine BMI: 28 MRN: [id number] Neck Size: 13.00  CLINICAL INFORMATION Sleep Study Type: NPSG Indication for sleep study: Excessive Daytime Sleepiness Epworth Sleepiness Score: 6  SLEEP STUDY TECHNIQUE As per the AASM Manual for the Scoring of Sleep and Associated Events v2.3 (April 2016) with a hypopnea requiring 4% desaturations.  The channels recorded and monitored were frontal, central and occipital EEG, electrooculogram (EOG), submentalis EMG (chin), nasal and oral airflow, thoracic and abdominal wall motion, anterior tibialis EMG, snore microphone, electrocardiogram, and pulse oximetry.  MEDICATIONS Medications self-administered by patient taken the night of the study : none reported  SLEEP ARCHITECTURE The study was initiated at 11:04:53 PM and ended at 5:59:44 AM.  Sleep onset time was 9.7 minutes and the sleep efficiency was 28.6%%. The total sleep time was 118.5 minutes.  Stage REM latency was 99.0 minutes.  The patient spent 8.0%% of the night in stage N1 sleep, 62.4%% in stage N2 sleep, 0.0%% in stage N3 and 29.5% in REM.  Alpha intrusion was absent.  Supine sleep was 100.00%.  RESPIRATORY PARAMETERS The overall apnea/hypopnea index (AHI) was 3.0 per hour. There were 1 total apneas, including 1 obstructive, 0 central and 0 mixed apneas. There were 5 hypopneas and 1 RERAs.  The AHI during Stage REM sleep was 6.9 per hour.  AHI while supine was 3.0 per hour.  The mean oxygen saturation was 92.3%. The minimum SpO2 during sleep was 88.0%.  soft snoring was noted during this study.  CARDIAC DATA The 2 lead EKG demonstrated sinus rhythm. The mean heart rate was 99.1 beats per minute. Other EKG findings include: None.  LEG  MOVEMENT DATA The total PLMS were 0 with a resulting PLMS index of 0.0. Associated arousal with leg movement index was 0.0 .  IMPRESSIONS - No significant obstructive sleep apnea occurred during this study (AHI = 3.0/h). - No significant central sleep apnea occurred during this study (CAI = 0.0/h). - The patient had minimal or no oxygen desaturation during the study (Min O2 = 88.0%). Mean O2 sat 94.1%. Time with O2 sat 88% or less was 0.2 minutes. - The patient snored with soft snoring volume. - No cardiac abnormalities were noted during this study. - Clinically significant periodic limb movements did not occur during sleep. No significant associated arousals. - Note- Patient was unable to sustain sleep until 12:30 AM, then was awake after 2:00 AM until about 5:45 AM. Tech reported this was associated with complaints of back pain and nasal congestion. Tech contacted Market researcher, with decision that sleep time was insufficient to warrant proceeding with ordered MSLT at this time. Patient's arrival complaint was "Can't fall asleep, waking up, can't go back to sleep.  DIAGNOSIS - Insomnia - Primary snoring  RECOMMENDATIONS - Suggest directing treatment at somatic complaints and insomnia, based on clinical judgment. - Sleep hygiene should be reviewed to assess factors that may improve sleep quality. - Weight management and regular exercise should be initiated or continued if appropriate.  [Electronically signed] 01/26/2020 12:02 PM  Baird Lyons MD, ABSM Diplomate, American Board of Sleep Medicine   NPI: [id number]                         [place], American Board of Sleep Medicine  ELECTRONICALLY  SIGNED ON:  01/26/2020, 11:52 AM [facility] PH: [phone]   FX: [phone] [place]

## 2020-02-22 ENCOUNTER — Ambulatory Visit (INDEPENDENT_AMBULATORY_CARE_PROVIDER_SITE_OTHER): Payer: Medicare Other | Admitting: Family Medicine

## 2020-02-22 ENCOUNTER — Other Ambulatory Visit: Payer: Self-pay

## 2020-02-22 ENCOUNTER — Encounter: Payer: Self-pay | Admitting: Family Medicine

## 2020-02-22 ENCOUNTER — Ambulatory Visit: Payer: Self-pay

## 2020-02-22 DIAGNOSIS — M25552 Pain in left hip: Secondary | ICD-10-CM

## 2020-02-22 NOTE — Progress Notes (Signed)
Office Visit Note   Patient: Sierra Krueger           Date of Birth: 1955-12-12           MRN: [id number] Visit Date: 02/22/2020 Requested by: Dulce Sellar, MD [address] [place],  [place] [address] PCP: Dulce Sellar, MD  Subjective: Chief Complaint  Patient presents with   Left Hip - Pain    HPI: She is here with recurrent left hip pain.  Greater trochanter injection in July helped until several weeks ago, it has gradually started to hurt again.  She would like to try another injection.  Pain on the lateral aspect mainly with activities.  Pain does not keep her awake at night.              ROS:   All other systems were reviewed and are negative.  Objective: Vital Signs: There were no vitals taken for this visit.  Physical Exam:  General:  Alert and oriented, in no acute distress. Pulm:  Breathing unlabored. Psy:  Normal mood, congruent affect.  Left hip: Good range of motion and no significant pain with passive flexion and internal rotation.  She remains tender over the greater trochanter.  Imaging: No results found.  Assessment & Plan: 1.  Left hip greater trochanter syndrome -Steroid injection today.  If she starts getting short-term relief, we will try dextrose prolotherapy.     Procedures: Left hip injection: After sterile prep with Betadine, injected 8 cc 1% lidocaine without epinephrine and 40 mg methylprednisolone into the area of maximum tenderness.  Good immediate relief.    PMFS History: Patient Active Problem List   Diagnosis Date Noted   Primary hyperparathyroidism ([place]) 06/22/2019   Hyperparathyroidism, primary ([place]) 06/20/2019   Renal calculus, left 04/24/2018   Pain in left hip 08/11/2017   Trochanteric bursitis, left hip 05/18/2016   Past Medical History:  Diagnosis Date   Ambulates with cane    Anxiety    CKD (chronic kidney disease), stage II    Decreased peripheral vision of both eyes    left side due to CVA 2007     History of CVA with residual deficit 12/2005   large right PCA territory subacute infarct without hemorrhage w/ left sided upper/lower extremity weakness and left peripheral vision loss   Hypertension    Left-sided weakness    upper/ lower extremity,  CVA residual 2007   Renal calculus, bilateral    Stroke ([place]) 2007   lt side weakness.   Wears partial dentures    upper    History reviewed. No pertinent family history.  Past Surgical History:  Procedure Laterality Date   BREAST LUMPECTOMY Left 1997   benign tumor    CYSTOSCOPY/URETEROSCOPY/HOLMIUM LASER/STENT PLACEMENT Bilateral 05/15/2018   Procedure: CYSTOSCOPY/RETROGRADE/URETEROSCOPY/HOLMIUM LASER/BASKET STONE EXTRACTION,  LEFT STENT REPLACEMENT, RIGHT STENT PLACEMENT;  Surgeon: Kathie Rhodes, MD;  Location[facility];  Service: Urology;  Laterality: Bilateral;   FOOT SURGERY Left 2010   HOLMIUM LASER APPLICATION Bilateral 10/17/7626   Procedure: HOLMIUM LASER APPLICATION;  Surgeon: Kathie Rhodes, MD;  Location[facility];  Service: Urology;  Laterality: Bilateral;   IR URETERAL STENT LEFT NEW ACCESS W/O SEP NEPHROSTOMY CATH  04/24/2018   LUMBAR SPINE SURGERY  2002   L3 -- 5   NEPHROLITHOTOMY Left 04/24/2018   Procedure: NEPHROLITHOTOMY PERCUTANEOUS;  Surgeon: Kathie Rhodes, MD;  Location: [facility];  Service: Urology;  Laterality: Left;   PARATHYROIDECTOMY N/A 06/22/2019  Procedure: NECK EXPLORATION WITH PARATHYROIDECTOMY WITH PARATHYROID BIOPSY;  Surgeon: Armandina Gemma, MD;  Location: [facility];  Service: General;  Laterality: N/A;   TROCHANTERIC BURSA EXCISION Left 09-02-2016    dr Ninfa Linden  @[facility]    Social History   Occupational History    Employer: DISABLED  Tobacco Use   Smoking status: Former Smoker    Years: 10.00    Types: Cigarettes    Quit date: 11/01/2008    Years since quitting: 11.3   Smokeless tobacco: Never Used  Vaping Use   Vaping Use: Never used  Substance and  Sexual Activity   Alcohol use: Not Currently   Drug use: No   Sexual activity: Not Currently

## 2020-03-03 ENCOUNTER — Other Ambulatory Visit: Payer: Self-pay | Admitting: Adult Medicine

## 2020-03-03 DIAGNOSIS — Z1231 Encounter for screening mammogram for malignant neoplasm of breast: Secondary | ICD-10-CM

## 2020-03-05 ENCOUNTER — Other Ambulatory Visit: Payer: Self-pay

## 2020-03-05 ENCOUNTER — Ambulatory Visit
Admission: RE | Admit: 2020-03-05 | Discharge: 2020-03-05 | Disposition: A | Discharge status: Home / Self Care | Payer: Medicare Other | Source: Ambulatory Visit | Attending: Adult Medicine | Admitting: Adult Medicine

## 2020-03-05 DIAGNOSIS — Z1231 Encounter for screening mammogram for malignant neoplasm of breast: Secondary | ICD-10-CM

## 2020-10-26 IMAGING — US US SOFT TISSUE HEAD/NECK
1 series · 7 of 7 positions shown · non-contrast
Comparison: Neck CT 12/26/2018

CLINICAL DATA: 63-year-old with left neck pain. Recent
parathyroidectomy. Evaluate for hematoma.

EXAM:
ULTRASOUND OF HEAD/NECK SOFT TISSUES
TECHNIQUE: Ultrasound examination of the head and neck soft tissues was
performed in the area of clinical concern.

[Series 1: us soft tissue head/neck · 7 acquisitions, 7 frames shown]
[im 1/7]
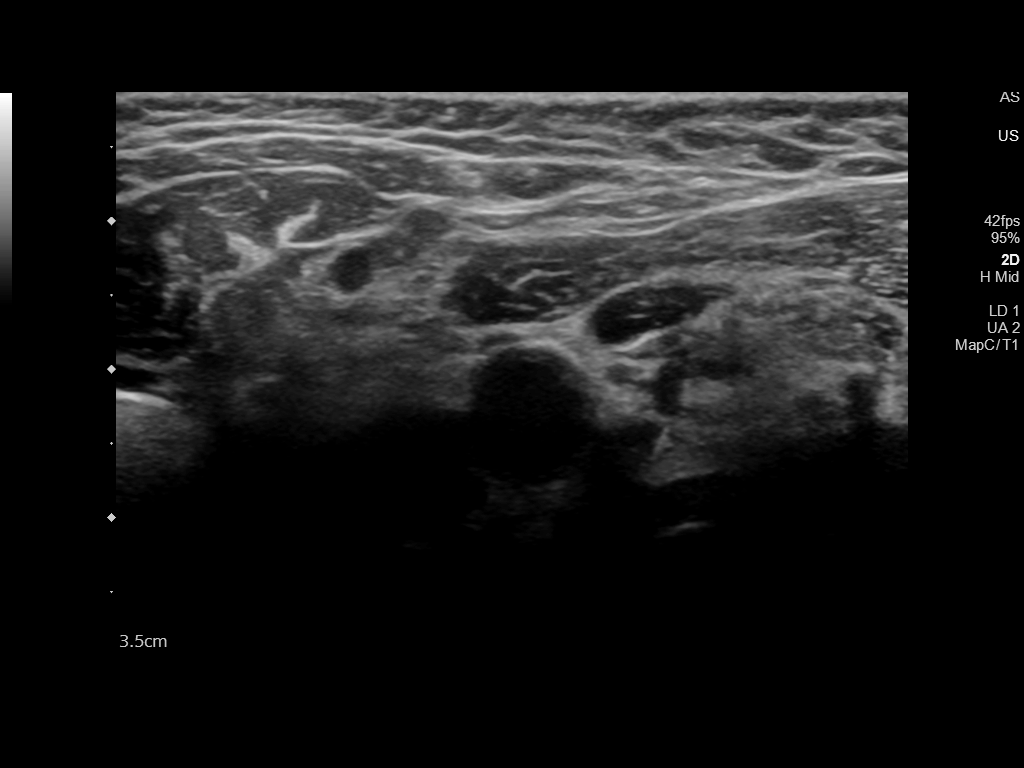
[im 2/7]
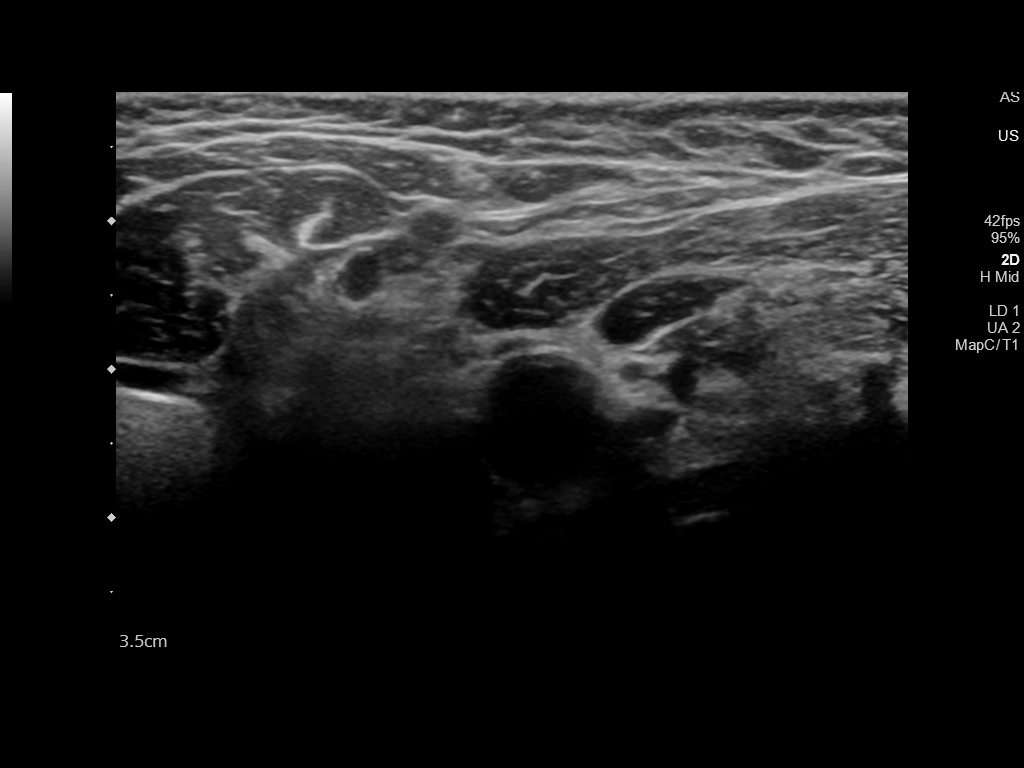
[im 3/7]
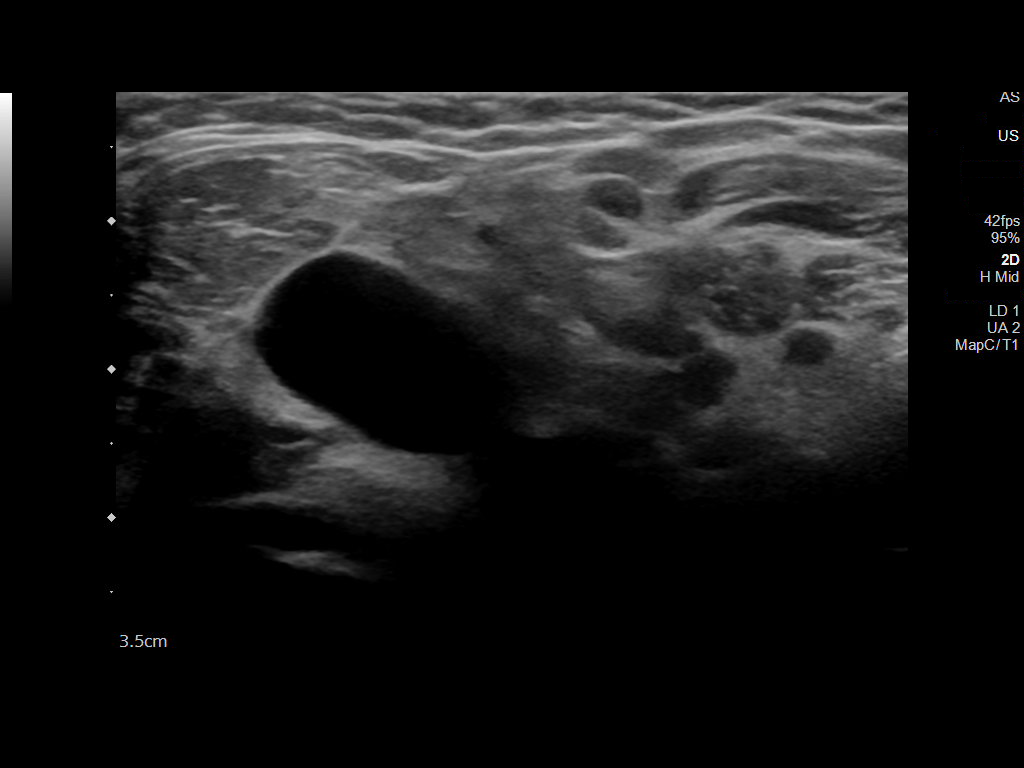
[im 4/7]
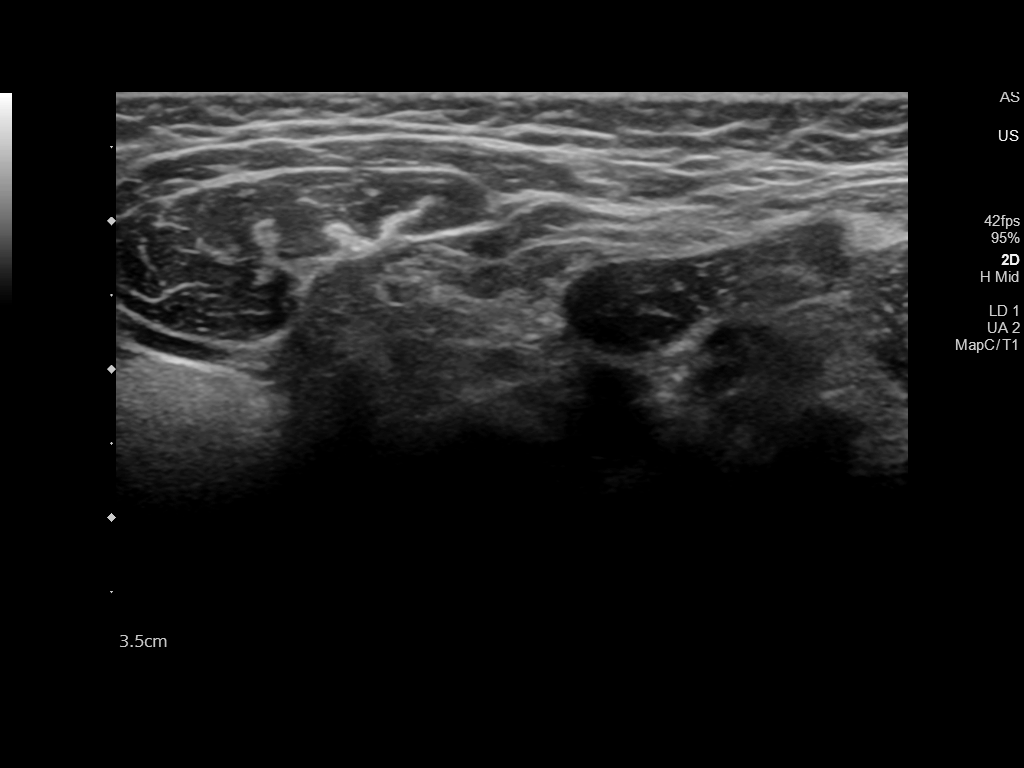
[im 5/7]
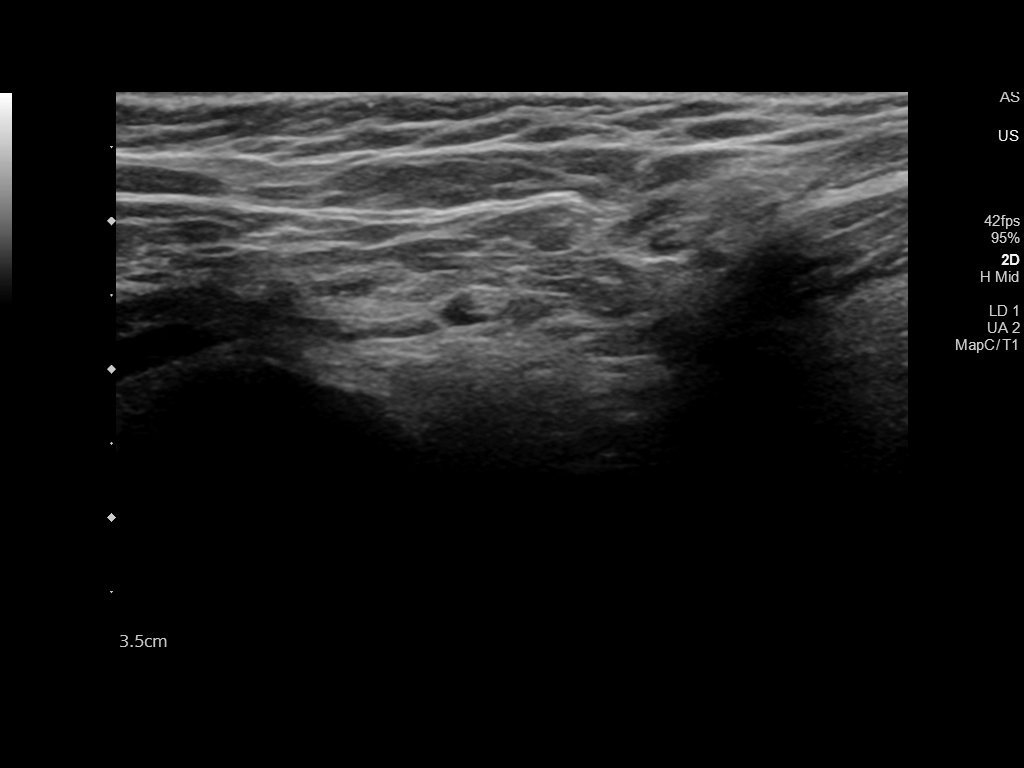
[im 6/7]
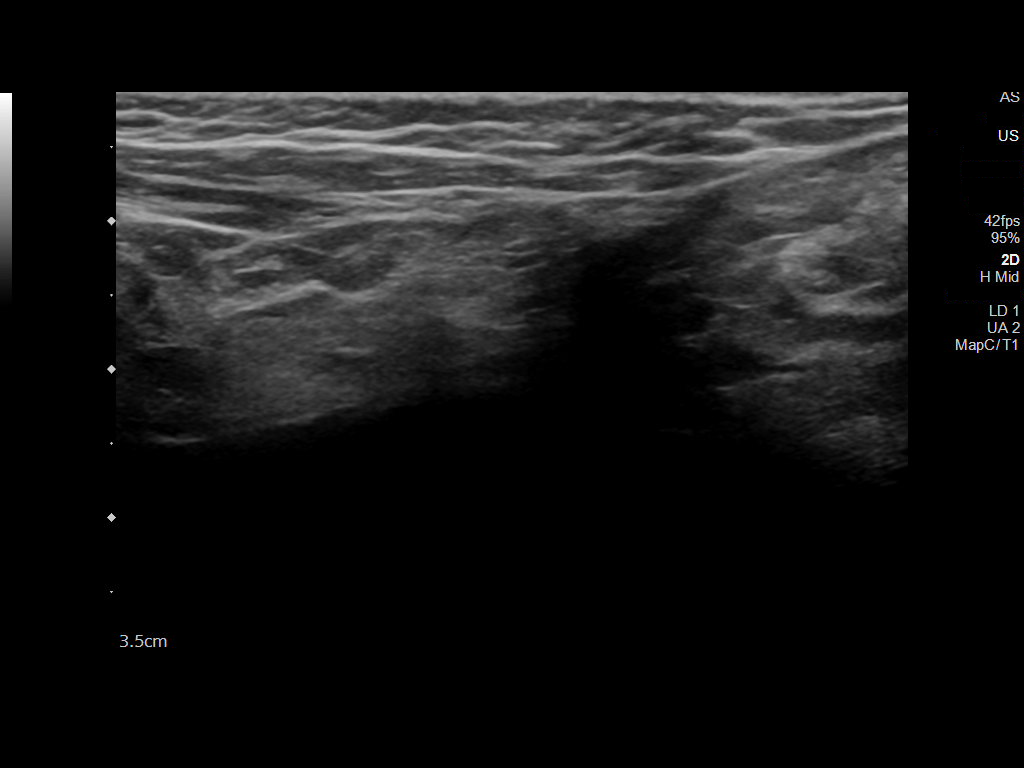
[im 7/7]
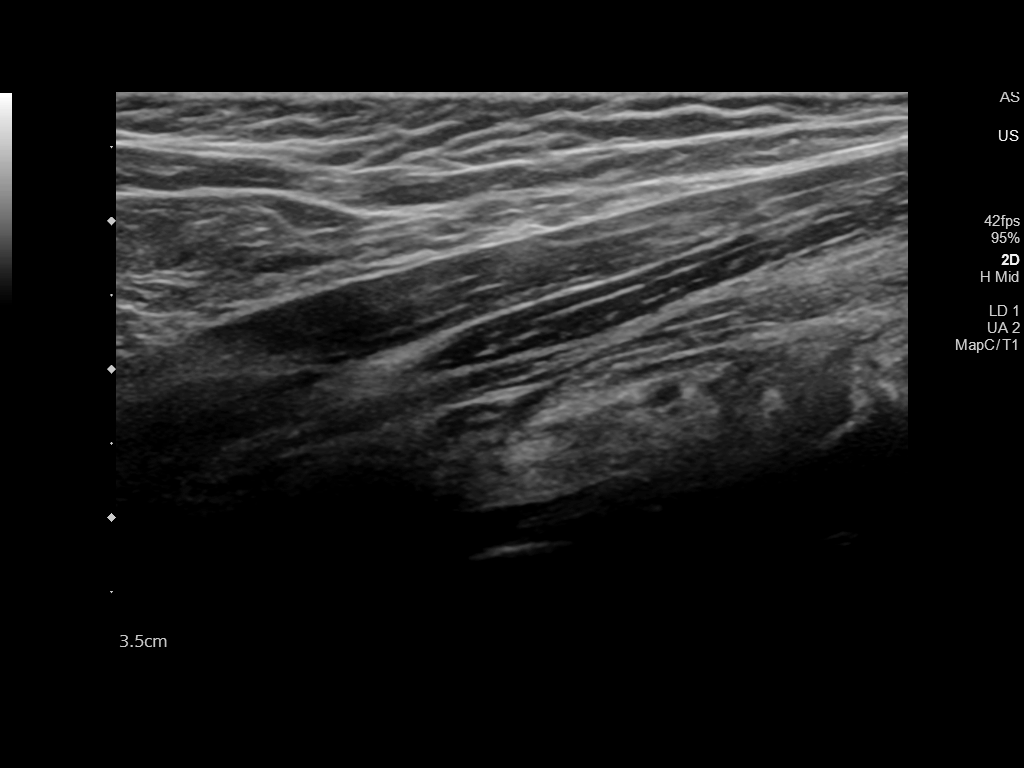

[7 of 7 positions shown; findings below may reference images not displayed]

FINDINGS: No evidence for a solid or cystic lesion at the area of concern.
Specifically, there is no evidence for a hematoma formation. There
are small lymph nodes scattered throughout the visualized left neck.
IMPRESSION: No suspicious solid or cystic lesions at the area of concern in the
left neck. Specifically, no evidence for a large hematoma.

Small lymph nodes on left side of the neck are nonspecific and could
be reactive.

## 2020-11-12 ENCOUNTER — Other Ambulatory Visit: Payer: Self-pay | Admitting: Adult Medicine

## 2020-11-12 DIAGNOSIS — Z122 Encounter for screening for malignant neoplasm of respiratory organs: Secondary | ICD-10-CM

## 2020-11-12 DIAGNOSIS — Z1231 Encounter for screening mammogram for malignant neoplasm of breast: Secondary | ICD-10-CM

## 2020-12-05 ENCOUNTER — Encounter: Payer: Self-pay | Admitting: Family Medicine

## 2020-12-05 ENCOUNTER — Ambulatory Visit (INDEPENDENT_AMBULATORY_CARE_PROVIDER_SITE_OTHER): Payer: Medicare Other | Admitting: Family Medicine

## 2020-12-05 DIAGNOSIS — M25552 Pain in left hip: Secondary | ICD-10-CM

## 2020-12-05 NOTE — Progress Notes (Signed)
Office Visit Note   Patient: Sierra Krueger           Date of Birth: 1955/08/26           MRN: [id number] Visit Date: 12/05/2020 Requested by: Dulce Sellar, MD [address] [place],  [place] [address] PCP: Dulce Sellar, MD  Subjective: Chief Complaint  Patient presents with   Left Hip - Pain    Pain in the lateral hip, radiating down to mid thigh x at least 1 year. Constant, nagging pain. Was seen last July for this here. H/o trochanteric bursectomy in 2018 by Dr. Ninfa Linden.    HPI: She is here with recurrent left hip pain.  Posterior lateral pain with radiation into the mid thigh.  Injection last October helped quite a bit until just recently.  Today she is feeling pretty good, she does not think it needs any treatment.              ROS:   All other systems were reviewed and are negative.  Objective: Vital Signs: There were no vitals taken for this visit.  Physical Exam:  General:  Alert and oriented, in no acute distress. Pulm:  Breathing unlabored. Psy:  Normal mood, congruent affect  Left hip: She has no pain with passive internal rotation, and her range of motion is still good.  She is tender on the posterior lateral aspect of the greater trochanter and this reproduces her pain.  No pain with resisted strength testing.  She walks with a very slight limp.    Imaging: No results found.  Assessment & Plan: Recurrent left hip greater trochanteric syndrome -Since she is feeling pretty well today, she wants to wait until it becomes more consistently painful and then she will come in for another greater trochanter injection.     Procedures: No procedures performed        PMFS History: Patient Active Problem List   Diagnosis Date Noted   Primary hyperparathyroidism ([place]) 06/22/2019   Hyperparathyroidism, primary ([place]) 06/20/2019   Renal calculus, left 04/24/2018   Pain in left hip 08/11/2017   Trochanteric bursitis, left hip 05/18/2016   Past Medical  History:  Diagnosis Date   Ambulates with cane    Anxiety    CKD (chronic kidney disease), stage II    Decreased peripheral vision of both eyes    left side due to CVA 2007   History of CVA with residual deficit 12/2005   large right PCA territory subacute infarct without hemorrhage w/ left sided upper/lower extremity weakness and left peripheral vision loss   Hypertension    Left-sided weakness    upper/ lower extremity,  CVA residual 2007   Renal calculus, bilateral    Stroke ([place]) 2007   lt side weakness.   Wears partial dentures    upper    History reviewed. No pertinent family history.  Past Surgical History:  Procedure Laterality Date   BREAST LUMPECTOMY Left 1997   benign tumor    CYSTOSCOPY/URETEROSCOPY/HOLMIUM LASER/STENT PLACEMENT Bilateral 05/15/2018   Procedure: CYSTOSCOPY/RETROGRADE/URETEROSCOPY/HOLMIUM LASER/BASKET STONE EXTRACTION,  LEFT STENT REPLACEMENT, RIGHT STENT PLACEMENT;  Surgeon: Kathie Rhodes, MD;  Location: [facility];  Service: Urology;  Laterality: Bilateral;   FOOT SURGERY Left 2010   HOLMIUM LASER APPLICATION Bilateral [id number]   Procedure: HOLMIUM LASER APPLICATION;  Surgeon: Kathie Rhodes, MD;  Location[facility];  Service: Urology;  Laterality: Bilateral;   IR URETERAL STENT LEFT NEW ACCESS W/O SEP NEPHROSTOMY CATH  04/24/2018   LUMBAR SPINE SURGERY  2002   L3 -- 5   NEPHROLITHOTOMY Left 04/24/2018   Procedure: NEPHROLITHOTOMY PERCUTANEOUS;  Surgeon: Kathie Rhodes, MD;  Location: [facility];  Service: Urology;  Laterality: Left;   PARATHYROIDECTOMY N/A 06/22/2019   Procedure: NECK EXPLORATION WITH PARATHYROIDECTOMY WITH PARATHYROID BIOPSY;  Surgeon: Armandina Gemma, MD;  Location: [facility];  Service: General;  Laterality: N/A;   TROCHANTERIC BURSA EXCISION Left 09-02-2016    dr Ninfa Linden  '@[facility]    Social History   Occupational History    Employer: DISABLED  Tobacco Use   Smoking status: Former    Years: 10.00    Types:  Cigarettes    Quit date: 11/01/2008    Years since quitting: 12.1   Smokeless tobacco: Never  Vaping Use   Vaping Use: Never used  Substance and Sexual Activity   Alcohol use: Not Currently   Drug use: No   Sexual activity: Not Currently

## 2020-12-10 ENCOUNTER — Inpatient Hospital Stay: Admission: RE | Admit: 2020-12-10 | Payer: Medicare Other | Source: Ambulatory Visit

## 2021-03-16 ENCOUNTER — Ambulatory Visit
Admission: RE | Admit: 2021-03-16 | Discharge: 2021-03-16 | Disposition: A | Discharge status: Home / Self Care | Payer: Medicare Other | Source: Ambulatory Visit | Attending: Adult Medicine | Admitting: Adult Medicine

## 2021-03-16 ENCOUNTER — Other Ambulatory Visit: Payer: Self-pay

## 2021-03-16 DIAGNOSIS — Z1231 Encounter for screening mammogram for malignant neoplasm of breast: Secondary | ICD-10-CM

## 2021-11-21 ENCOUNTER — Other Ambulatory Visit: Payer: Self-pay

## 2021-11-21 ENCOUNTER — Emergency Department (HOSPITAL_COMMUNITY): Payer: Medicare Other

## 2021-11-21 ENCOUNTER — Encounter (HOSPITAL_COMMUNITY): Payer: Self-pay | Admitting: *Deleted

## 2021-11-21 ENCOUNTER — Inpatient Hospital Stay (HOSPITAL_COMMUNITY)
Admission: EM | Admit: 2021-11-21 | Discharge: 2021-11-24 | DRG: 176 | Disposition: A | Discharge status: Home / Self Care | Payer: Medicare Other | Attending: Internal Medicine | Admitting: Internal Medicine

## 2021-11-21 DIAGNOSIS — R000 Tachycardia, unspecified: Secondary | ICD-10-CM

## 2021-11-21 DIAGNOSIS — R0602 Shortness of breath: Principal | ICD-10-CM

## 2021-11-21 DIAGNOSIS — N182 Chronic kidney disease, stage 2 (mild): Secondary | ICD-10-CM | POA: Diagnosis present

## 2021-11-21 DIAGNOSIS — K219 Gastro-esophageal reflux disease without esophagitis: Secondary | ICD-10-CM | POA: Diagnosis present

## 2021-11-21 DIAGNOSIS — I10 Essential (primary) hypertension: Secondary | ICD-10-CM | POA: Diagnosis present

## 2021-11-21 DIAGNOSIS — I2693 Single subsegmental pulmonary embolism without acute cor pulmonale: Secondary | ICD-10-CM | POA: Diagnosis not present

## 2021-11-21 DIAGNOSIS — Z20822 Contact with and (suspected) exposure to covid-19: Secondary | ICD-10-CM | POA: Diagnosis present

## 2021-11-21 DIAGNOSIS — I2699 Other pulmonary embolism without acute cor pulmonale: Secondary | ICD-10-CM | POA: Diagnosis present

## 2021-11-21 DIAGNOSIS — Z7982 Long term (current) use of aspirin: Secondary | ICD-10-CM

## 2021-11-21 DIAGNOSIS — Z91013 Allergy to seafood: Secondary | ICD-10-CM

## 2021-11-21 DIAGNOSIS — R0902 Hypoxemia: Secondary | ICD-10-CM | POA: Diagnosis present

## 2021-11-21 DIAGNOSIS — I129 Hypertensive chronic kidney disease with stage 1 through stage 4 chronic kidney disease, or unspecified chronic kidney disease: Secondary | ICD-10-CM | POA: Diagnosis present

## 2021-11-21 DIAGNOSIS — Z79899 Other long term (current) drug therapy: Secondary | ICD-10-CM

## 2021-11-21 DIAGNOSIS — I69354 Hemiplegia and hemiparesis following cerebral infarction affecting left non-dominant side: Secondary | ICD-10-CM

## 2021-11-21 DIAGNOSIS — Z880 Allergy status to penicillin: Secondary | ICD-10-CM

## 2021-11-21 DIAGNOSIS — I2694 Multiple subsegmental pulmonary emboli without acute cor pulmonale: Secondary | ICD-10-CM

## 2021-11-21 DIAGNOSIS — Z885 Allergy status to narcotic agent status: Secondary | ICD-10-CM

## 2021-11-21 DIAGNOSIS — Z87891 Personal history of nicotine dependence: Secondary | ICD-10-CM

## 2021-11-21 LAB — COMPREHENSIVE METABOLIC PANEL
ALT: 7 U/L (ref 0–44)
AST: 14 U/L — ABNORMAL LOW (ref 15–41)
Albumin: 3.4 g/dL — ABNORMAL LOW (ref 3.5–5.0)
Alkaline Phosphatase: 61 U/L (ref 38–126)
Anion gap: 7 (ref 5–15)
BUN: 11 mg/dL (ref 8–23)
CO2: 25 mmol/L (ref 22–32)
Calcium: 9.1 mg/dL (ref 8.9–10.3)
Chloride: 104 mmol/L (ref 98–111)
Creatinine, Ser: 1.24 mg/dL — ABNORMAL HIGH (ref 0.44–1.00)
GFR, Estimated: 49 mL/min — ABNORMAL LOW (ref >60)
Glucose, Bld: 108 mg/dL — ABNORMAL HIGH (ref 70–99)
Potassium: 4.3 mmol/L (ref 3.5–5.1)
Sodium: 136 mmol/L (ref 135–145)
Total Bilirubin: 0.3 mg/dL (ref 0.3–1.2)
Total Protein: 6.7 g/dL (ref 6.5–8.1)

## 2021-11-21 LAB — D-DIMER, QUANTITATIVE: D-Dimer, Quant: 1.24 ug/mL-FEU — ABNORMAL HIGH (ref 0.00–0.50)

## 2021-11-21 LAB — CBC WITH DIFFERENTIAL/PLATELET
Abs Immature Granulocytes: 0.01 10*3/uL (ref 0.00–0.07)
Basophils Absolute: 0.1 10*3/uL (ref 0.0–0.1)
Basophils Relative: 1 %
Eosinophils Absolute: 0.1 10*3/uL (ref 0.0–0.5)
Eosinophils Relative: 2 %
HCT: 34 % — ABNORMAL LOW (ref 36.0–46.0)
Hemoglobin: 11.1 g/dL — ABNORMAL LOW (ref 12.0–15.0)
Immature Granulocytes: 0 %
Lymphocytes Relative: 21 %
Lymphs Abs: 1.3 10*3/uL (ref 0.7–4.0)
MCH: 29.1 pg (ref 26.0–34.0)
MCHC: 32.6 g/dL (ref 30.0–36.0)
MCV: 89 fL (ref 80.0–100.0)
Monocytes Absolute: 0.5 10*3/uL (ref 0.1–1.0)
Monocytes Relative: 8 %
Neutro Abs: 4.1 10*3/uL (ref 1.7–7.7)
Neutrophils Relative %: 68 %
Platelets: 266 10*3/uL (ref 150–400)
RBC: 3.82 MIL/uL — ABNORMAL LOW (ref 3.87–5.11)
RDW: 14.7 % (ref 11.5–15.5)
WBC: 6 10*3/uL (ref 4.0–10.5)
nRBC: 0 % (ref 0.0–0.2)

## 2021-11-21 LAB — TROPONIN I (HIGH SENSITIVITY)
Troponin I (High Sensitivity): 4 ng/L (ref <18)
Troponin I (High Sensitivity): 3 ng/L (ref <18)

## 2021-11-21 LAB — BRAIN NATRIURETIC PEPTIDE: B Natriuretic Peptide: 4.8 pg/mL (ref 0.0–100.0)

## 2021-11-21 MED ORDER — IOHEXOL 350 MG/ML SOLN
80.0000 mL | Freq: Once | INTRAVENOUS | Status: AC | PRN
  Administered 2021-11-21: 80 mL via INTRAVENOUS

## 2021-11-21 NOTE — ED Triage Notes (Signed)
The pt arrivec by gems from home where she has had   sob for one hour on arrival  she has no chest pain she does not  appear to be sob and her 02 sats are  ` above 97 5

## 2021-11-22 ENCOUNTER — Inpatient Hospital Stay (HOSPITAL_COMMUNITY): Payer: Medicare Other

## 2021-11-22 DIAGNOSIS — I2693 Single subsegmental pulmonary embolism without acute cor pulmonale: Secondary | ICD-10-CM | POA: Diagnosis present

## 2021-11-22 DIAGNOSIS — I129 Hypertensive chronic kidney disease with stage 1 through stage 4 chronic kidney disease, or unspecified chronic kidney disease: Secondary | ICD-10-CM | POA: Diagnosis present

## 2021-11-22 DIAGNOSIS — I2699 Other pulmonary embolism without acute cor pulmonale: Secondary | ICD-10-CM | POA: Diagnosis not present

## 2021-11-22 DIAGNOSIS — K219 Gastro-esophageal reflux disease without esophagitis: Secondary | ICD-10-CM | POA: Diagnosis present

## 2021-11-22 DIAGNOSIS — Z880 Allergy status to penicillin: Secondary | ICD-10-CM | POA: Diagnosis not present

## 2021-11-22 DIAGNOSIS — Z7982 Long term (current) use of aspirin: Secondary | ICD-10-CM | POA: Diagnosis not present

## 2021-11-22 DIAGNOSIS — R000 Tachycardia, unspecified: Secondary | ICD-10-CM | POA: Diagnosis present

## 2021-11-22 DIAGNOSIS — Z87891 Personal history of nicotine dependence: Secondary | ICD-10-CM | POA: Diagnosis not present

## 2021-11-22 DIAGNOSIS — Z91013 Allergy to seafood: Secondary | ICD-10-CM | POA: Diagnosis not present

## 2021-11-22 DIAGNOSIS — D259 Leiomyoma of uterus, unspecified: Secondary | ICD-10-CM | POA: Insufficient documentation

## 2021-11-22 DIAGNOSIS — I10 Essential (primary) hypertension: Secondary | ICD-10-CM | POA: Diagnosis not present

## 2021-11-22 DIAGNOSIS — I69354 Hemiplegia and hemiparesis following cerebral infarction affecting left non-dominant side: Secondary | ICD-10-CM | POA: Diagnosis not present

## 2021-11-22 DIAGNOSIS — Z79899 Other long term (current) drug therapy: Secondary | ICD-10-CM | POA: Diagnosis not present

## 2021-11-22 DIAGNOSIS — Z20822 Contact with and (suspected) exposure to covid-19: Secondary | ICD-10-CM | POA: Diagnosis present

## 2021-11-22 DIAGNOSIS — F419 Anxiety disorder, unspecified: Secondary | ICD-10-CM | POA: Insufficient documentation

## 2021-11-22 DIAGNOSIS — Z885 Allergy status to narcotic agent status: Secondary | ICD-10-CM | POA: Diagnosis not present

## 2021-11-22 DIAGNOSIS — R0602 Shortness of breath: Secondary | ICD-10-CM | POA: Diagnosis present

## 2021-11-22 DIAGNOSIS — R0902 Hypoxemia: Secondary | ICD-10-CM | POA: Diagnosis present

## 2021-11-22 DIAGNOSIS — N182 Chronic kidney disease, stage 2 (mild): Secondary | ICD-10-CM | POA: Diagnosis present

## 2021-11-22 LAB — CBC WITH DIFFERENTIAL/PLATELET
Abs Immature Granulocytes: 0.02 10*3/uL (ref 0.00–0.07)
Basophils Absolute: 0.1 10*3/uL (ref 0.0–0.1)
Basophils Relative: 1 %
Eosinophils Absolute: 0.2 10*3/uL (ref 0.0–0.5)
Eosinophils Relative: 2 %
HCT: 35.1 % — ABNORMAL LOW (ref 36.0–46.0)
Hemoglobin: 11 g/dL — ABNORMAL LOW (ref 12.0–15.0)
Immature Granulocytes: 0 %
Lymphocytes Relative: 34 %
Lymphs Abs: 2.4 10*3/uL (ref 0.7–4.0)
MCH: 28.6 pg (ref 26.0–34.0)
MCHC: 31.3 g/dL (ref 30.0–36.0)
MCV: 91.4 fL (ref 80.0–100.0)
Monocytes Absolute: 0.5 10*3/uL (ref 0.1–1.0)
Monocytes Relative: 7 %
Neutro Abs: 3.9 10*3/uL (ref 1.7–7.7)
Neutrophils Relative %: 56 %
Platelets: 279 10*3/uL (ref 150–400)
RBC: 3.84 MIL/uL — ABNORMAL LOW (ref 3.87–5.11)
RDW: 14.8 % (ref 11.5–15.5)
WBC: 7 10*3/uL (ref 4.0–10.5)
nRBC: 0 % (ref 0.0–0.2)

## 2021-11-22 LAB — HEPARIN LEVEL (UNFRACTIONATED)
Heparin Unfractionated: 0.69 IU/mL (ref 0.30–0.70)
Heparin Unfractionated: 0.77 IU/mL — ABNORMAL HIGH (ref 0.30–0.70)

## 2021-11-22 LAB — COMPREHENSIVE METABOLIC PANEL
ALT: 9 U/L (ref 0–44)
AST: 13 U/L — ABNORMAL LOW (ref 15–41)
Albumin: 3.2 g/dL — ABNORMAL LOW (ref 3.5–5.0)
Alkaline Phosphatase: 57 U/L (ref 38–126)
Anion gap: 6 (ref 5–15)
BUN: 7 mg/dL — ABNORMAL LOW (ref 8–23)
CO2: 23 mmol/L (ref 22–32)
Calcium: 8.7 mg/dL — ABNORMAL LOW (ref 8.9–10.3)
Chloride: 111 mmol/L (ref 98–111)
Creatinine, Ser: 1.07 mg/dL — ABNORMAL HIGH (ref 0.44–1.00)
GFR, Estimated: 58 mL/min — ABNORMAL LOW (ref >60)
Glucose, Bld: 98 mg/dL (ref 70–99)
Potassium: 4 mmol/L (ref 3.5–5.1)
Sodium: 140 mmol/L (ref 135–145)
Total Bilirubin: 0.4 mg/dL (ref 0.3–1.2)
Total Protein: 6.3 g/dL — ABNORMAL LOW (ref 6.5–8.1)

## 2021-11-22 LAB — HIV ANTIBODY (ROUTINE TESTING W REFLEX): HIV Screen 4th Generation wRfx: NONREACTIVE

## 2021-11-22 LAB — SEDIMENTATION RATE: Sed Rate: 38 mm/hr — ABNORMAL HIGH (ref 0–22)

## 2021-11-22 LAB — MAGNESIUM: Magnesium: 1.7 mg/dL (ref 1.7–2.4)

## 2021-11-22 LAB — C-REACTIVE PROTEIN: CRP: 1.3 mg/dL — ABNORMAL HIGH (ref <1.0)

## 2021-11-22 LAB — PROCALCITONIN: Procalcitonin: <0.1 ng/mL

## 2021-11-22 MED ORDER — CHLORPROMAZINE HCL 100 MG PO TABS
200.0000 mg | ORAL_TABLET | Freq: Every day | ORAL | Status: DC
  Administered 2021-11-22 – 2021-11-23 (×2): 200 mg via ORAL
  Filled 2021-11-22 (×3): qty 2

## 2021-11-22 MED ORDER — ONDANSETRON HCL 4 MG/2ML IJ SOLN: 4.0000 mg | Freq: Four times a day (QID) | INTRAMUSCULAR | Status: DC | PRN

## 2021-11-22 MED ORDER — HEPARIN (PORCINE) 25000 UT/250ML-% IV SOLN
900.0000 [IU]/h | INTRAVENOUS | Status: DC
  Administered 2021-11-22: 900 [IU]/h via INTRAVENOUS
  Administered 2021-11-22: 1100 [IU]/h via INTRAVENOUS
  Administered 2021-11-24: 900 [IU]/h via INTRAVENOUS
  Filled 2021-11-22 (×3): qty 250

## 2021-11-22 MED ORDER — PREGABALIN 25 MG PO CAPS
25.0000 mg | ORAL_CAPSULE | Freq: Three times a day (TID) | ORAL | Status: DC | PRN
  Administered 2021-11-22: 25 mg via ORAL
  Filled 2021-11-22: qty 1

## 2021-11-22 MED ORDER — POLYETHYLENE GLYCOL 3350 17 G PO PACK
17.0000 g | PACK | Freq: Every day | ORAL | Status: DC | PRN
Start: 2021-11-22 — End: 2021-11-24

## 2021-11-22 MED ORDER — PANTOPRAZOLE SODIUM 20 MG PO TBEC
20.0000 mg | DELAYED_RELEASE_TABLET | Freq: Every day | ORAL | Status: DC
  Administered 2021-11-22 – 2021-11-24 (×3): 20 mg via ORAL
  Filled 2021-11-22 (×3): qty 1

## 2021-11-22 MED ORDER — LAMOTRIGINE 25 MG PO TABS: 100.0000 mg | ORAL_TABLET | Freq: Every day | ORAL | Status: DC

## 2021-11-22 MED ORDER — FENTANYL CITRATE PF 50 MCG/ML IJ SOSY
50.0000 ug | PREFILLED_SYRINGE | INTRAMUSCULAR | Status: DC | PRN
  Administered 2021-11-22 – 2021-11-23 (×2): 50 ug via INTRAVENOUS
  Filled 2021-11-22 (×2): qty 1

## 2021-11-22 MED ORDER — SODIUM CHLORIDE 0.9 % IV BOLUS (SEPSIS)
1000.0000 mL | Freq: Once | INTRAVENOUS | Status: AC
  Administered 2021-11-22: 1000 mL via INTRAVENOUS

## 2021-11-22 MED ORDER — DILTIAZEM HCL 30 MG PO TABS
30.0000 mg | ORAL_TABLET | Freq: Two times a day (BID) | ORAL | Status: DC
  Administered 2021-11-23: 30 mg via ORAL
  Filled 2021-11-22: qty 1

## 2021-11-22 MED ORDER — MIRTAZAPINE 15 MG PO TABS
15.0000 mg | ORAL_TABLET | Freq: Every day | ORAL | Status: DC
  Administered 2021-11-22 – 2021-11-23 (×2): 15 mg via ORAL
  Filled 2021-11-22 (×2): qty 1

## 2021-11-22 MED ORDER — DILTIAZEM HCL 30 MG PO TABS: 30.0000 mg | ORAL_TABLET | Freq: Two times a day (BID) | ORAL | Status: DC

## 2021-11-22 MED ORDER — QUETIAPINE FUMARATE 25 MG PO TABS
25.0000 mg | ORAL_TABLET | Freq: Every day | ORAL | Status: DC
  Administered 2021-11-22 – 2021-11-23 (×2): 25 mg via ORAL
  Filled 2021-11-22 (×2): qty 1

## 2021-11-22 MED ORDER — AMLODIPINE BESYLATE 5 MG PO TABS
5.0000 mg | ORAL_TABLET | Freq: Every day | ORAL | Status: DC
  Administered 2021-11-22: 5 mg via ORAL
  Filled 2021-11-22: qty 1

## 2021-11-22 MED ORDER — OXYCODONE HCL 5 MG PO TABS
10.0000 mg | ORAL_TABLET | ORAL | Status: DC | PRN
  Administered 2021-11-22 – 2021-11-24 (×7): 10 mg via ORAL
  Filled 2021-11-22 (×7): qty 2

## 2021-11-22 MED ORDER — DICLOFENAC SODIUM 1 % EX GEL
2.0000 g | Freq: Two times a day (BID) | CUTANEOUS | Status: DC
Start: 2021-11-22 — End: 2021-11-24
  Administered 2021-11-23: 2 g via TOPICAL
  Filled 2021-11-22: qty 100

## 2021-11-22 MED ORDER — HEPARIN BOLUS VIA INFUSION
4000.0000 [IU] | Freq: Once | INTRAVENOUS | Status: AC
  Administered 2021-11-22: 4000 [IU] via INTRAVENOUS
  Filled 2021-11-22: qty 4000

## 2021-11-22 MED ORDER — PANTOPRAZOLE SODIUM 40 MG PO TBEC: 40.0000 mg | DELAYED_RELEASE_TABLET | Freq: Every day | ORAL | Status: DC

## 2021-11-22 MED ORDER — ONDANSETRON HCL 4 MG PO TABS: 4.0000 mg | ORAL_TABLET | Freq: Four times a day (QID) | ORAL | Status: DC | PRN

## 2021-11-22 MED ORDER — AMLODIPINE BESYLATE 5 MG PO TABS: 5.0000 mg | ORAL_TABLET | Freq: Every day | ORAL | Status: DC

## 2021-11-22 MED ORDER — TIZANIDINE HCL 4 MG PO TABS: 4.0000 mg | ORAL_TABLET | Freq: Three times a day (TID) | ORAL | Status: DC | PRN

## 2021-11-22 MED ORDER — ROSUVASTATIN CALCIUM 5 MG PO TABS
10.0000 mg | ORAL_TABLET | Freq: Every day | ORAL | Status: DC
  Administered 2021-11-22 – 2021-11-24 (×3): 10 mg via ORAL
  Filled 2021-11-22 (×3): qty 2

## 2021-11-22 MED ORDER — LAMOTRIGINE 100 MG PO TABS
100.0000 mg | ORAL_TABLET | Freq: Every day | ORAL | Status: DC
  Administered 2021-11-22: 100 mg via ORAL
  Filled 2021-11-22: qty 4

## 2021-11-22 MED ORDER — LAMOTRIGINE 100 MG PO TABS
300.0000 mg | ORAL_TABLET | Freq: Every day | ORAL | Status: DC
  Administered 2021-11-22 – 2021-11-24 (×3): 300 mg via ORAL
  Filled 2021-11-22 (×3): qty 3

## 2021-11-22 MED ORDER — ACETAMINOPHEN 650 MG RE SUPP: 650.0000 mg | Freq: Four times a day (QID) | RECTAL | Status: DC | PRN

## 2021-11-22 MED ORDER — FLUTICASONE PROPIONATE 50 MCG/ACT NA SUSP
1.0000 | Freq: Every day | NASAL | Status: DC | PRN
Start: 2021-11-22 — End: 2021-11-24
  Filled 2021-11-22: qty 16

## 2021-11-22 MED ORDER — ACETAMINOPHEN 325 MG PO TABS
650.0000 mg | ORAL_TABLET | Freq: Four times a day (QID) | ORAL | Status: DC | PRN
  Administered 2021-11-22: 650 mg via ORAL
  Filled 2021-11-22: qty 2

## 2021-11-22 MED ORDER — SODIUM CHLORIDE 0.9 % IV SOLN
1000.0000 mL | INTRAVENOUS | Status: DC
  Administered 2021-11-22 – 2021-11-24 (×6): 1000 mL via INTRAVENOUS

## 2021-11-22 MED ORDER — HYDROXYZINE HCL 10 MG PO TABS
10.0000 mg | ORAL_TABLET | Freq: Three times a day (TID) | ORAL | Status: DC | PRN
Start: 2021-11-22 — End: 2021-11-24

## 2021-11-22 NOTE — Progress Notes (Signed)
ANTICOAGULATION CONSULT NOTE - Initial Consult  Pharmacy Consult for Heparin  Indication: pulmonary embolus  Allergies  Allergen Reactions   Codeine Hives   Morphine And Related Itching and Nausea And Vomiting   Penicillin G Other (See Comments)   Shellfish Allergy Hives and Swelling    Patient Measurements: Height: '5\' 4"'$  (162.6 cm) Weight: 73 kg (160 lb 15 oz) IBW/kg (Calculated) : 54.7  Vital Signs: Temp: 98.2 F (36.8 C) (07/30 0329) Temp Source: Oral (07/30 0329) BP: 132/75 (07/30 0300) Pulse Rate: 115 (07/30 0300)  Labs: Recent Labs    11/21/21 2042 11/21/21 2248  HGB 11.1*  --   HCT 34.0*  --   PLT 266  --   CREATININE 1.24*  --   TROPONINIHS 4 3    Estimated Creatinine Clearance: 44.9 mL/min (A) (by C-G formula based on SCr of 1.24 mg/dL (H)).   Medical History: Past Medical History:  Diagnosis Date   Ambulates with cane    Anxiety    CKD (chronic kidney disease), stage II    Decreased peripheral vision of both eyes    left side due to CVA 2007   History of CVA with residual deficit 12/2005   large right PCA territory subacute infarct without hemorrhage w/ left sided upper/lower extremity weakness and left peripheral vision loss   Hypertension    Left-sided weakness    upper/ lower extremity,  CVA residual 2007   Renal calculus, bilateral    Stroke ([place]) 2007   lt side weakness.   Wears partial dentures    upper     Assessment: 66 y/o F with ?small PE on CT angio, starting heparin, Hgb 11.1, Scr 1.24, PTA meds reviewed.   Goal of Therapy:  Heparin level 0.3-0.7 units/ml Monitor platelets by anticoagulation protocol: Yes   Plan:  Heparin 4000 units BOLUS Start heparin drip at 1100 units/hr 1100 Heparin level Daily CBC/Heparin level Monitor for bleeding  Narda Bonds, PharmD, BCPS Clinical Pharmacist Phone: [phone]

## 2021-11-22 NOTE — Assessment & Plan Note (Signed)
Continuing home regimen of daily PPI therapy.  

## 2021-11-22 NOTE — H&P (Signed)
History and Physical    Patient: Sierra Krueger MRN: [id number] DOA: 11/21/2021  Date of Service: the patient was seen and examined on 11/22/2021  Patient coming from: Home via EMS  Chief Complaint:  Chief Complaint  Patient presents with   Shortness of Breath    HPI:   66 year old female with past medical history of hypertension, prior stroke (right PCA in 2007), gastroesophageal reflux disease who presents to [facility] emergency department via EMS due to complaints of shortness of breath.  Patient explains that yesterday evening she suddenly woke up in her bed with severe shortness of breath.  Patient denies associated shortness of breath.  Shortness breath is severe in intensity, worse with any movement whatsoever and associated with generalized weakness.  Upon further questioning patient denies any recent leg swelling, palpitations, fevers, sick contacts cough or contact with confirmed COVID-19 infection.  Patient denies any recent long distance trips or any recent surgical procedures.  Patient denies any sudden rapid weight loss, drastic changes in appetite or night sweats.  After several hours of persisting symptoms patient contacted EMS who promptly came to evaluate the patient and brought the patient to [facility] emergency department for evaluation.  Upon evaluation in the emergency department D-dimer was found to be somewhat elevated and patient was found to be quite tachycardic.  Therefore patient underwent CT angiogram of the chest.  Multiple intraluminal densities were found in the subsegmental right upper lobe as well as the right lower lobe.  Initially, there was some question as to whether these were legitimately pulmonary emboli and attempts were made to ambulate the patient however this resulted in severe shortness of breath with exertion.  Therefore, the patient has been placed on a heparin infusion for suspected pulmonary embolism and the hospitalist group  has now been called to assess the patient for admission to the hospital.  Review of Systems: Review of Systems  Respiratory:  Positive for shortness of breath.   All other systems reviewed and are negative.    Past Medical History:  Diagnosis Date   Ambulates with cane    Anxiety    CKD (chronic kidney disease), stage II    Decreased peripheral vision of both eyes    left side due to CVA 2007   History of CVA with residual deficit 12/2005   large right PCA territory subacute infarct without hemorrhage w/ left sided upper/lower extremity weakness and left peripheral vision loss   Hypertension    Left-sided weakness    upper/ lower extremity,  CVA residual 2007   Renal calculus, bilateral    Stroke ([place]) 2007   lt side weakness.   Wears partial dentures    upper    Past Surgical History:  Procedure Laterality Date   BREAST LUMPECTOMY Left 1997   benign tumor    CYSTOSCOPY/URETEROSCOPY/HOLMIUM LASER/STENT PLACEMENT Bilateral 05/15/2018   Procedure: CYSTOSCOPY/RETROGRADE/URETEROSCOPY/HOLMIUM LASER/BASKET STONE EXTRACTION,  LEFT STENT REPLACEMENT, RIGHT STENT PLACEMENT;  Surgeon: Kathie Rhodes, MD;  Location: [facility];  Service: Urology;  Laterality: Bilateral;   FOOT SURGERY Left 2010   HOLMIUM LASER APPLICATION Bilateral 8/67/6195   Procedure: HOLMIUM LASER APPLICATION;  Surgeon: Kathie Rhodes, MD;  Location: [facility];  Service: Urology;  Laterality: Bilateral;   IR URETERAL STENT LEFT NEW ACCESS W/O SEP NEPHROSTOMY CATH  04/24/2018   LUMBAR SPINE SURGERY  2002   L3 -- 5   NEPHROLITHOTOMY Left 04/24/2018   Procedure: NEPHROLITHOTOMY PERCUTANEOUS;  Surgeon: Kathie Rhodes, MD;  Location: [facility];  Service: Urology;  Laterality: Left;   PARATHYROIDECTOMY N/A 06/22/2019   Procedure: NECK EXPLORATION WITH PARATHYROIDECTOMY WITH PARATHYROID BIOPSY;  Surgeon: Armandina Gemma, MD;  Location: [facility];  Service: General;  Laterality: N/A;   TROCHANTERIC  BURSA EXCISION Left 09-02-2016    dr Ninfa Linden  '@[facility]'$     Social History:  reports that she quit smoking about 13 years ago. Her smoking use included cigarettes. She has never used smokeless tobacco. She reports that she does not currently use alcohol. She reports that she does not use drugs.  Allergies  Allergen Reactions   Codeine Hives   Morphine And Related Itching and Nausea And Vomiting   Penicillin G Other (See Comments)   Shellfish Allergy Hives and Swelling    No family history on file.  Prior to Admission medications   Medication Sig Start Date End Date Taking? Authorizing Provider  amLODipine (NORVASC) 5 MG tablet Take 5 mg by mouth daily. 09/03/19   [provider]  aspirin 325 MG tablet Take 325 mg by mouth daily.    [provider]  cetirizine (ZYRTEC) 10 MG tablet Take 10 mg by mouth daily as needed for allergies.    [provider]  fluticasone (FLONASE) 50 MCG/ACT nasal spray Place 1 spray into both nostrils daily as needed for allergies or rhinitis.    [provider]  lamoTRIgine (LAMICTAL) 100 MG tablet Take 100 mg by mouth daily. 11/19/19   [provider]  lidocaine (LIDODERM) 5 % Place 2 patches onto the skin daily.  05/21/19   [provider]  mirtazapine (REMERON) 15 MG tablet Take 15 mg by mouth at bedtime. 10/08/19   [provider]  oxyCODONE (OXY IR/ROXICODONE) 5 MG immediate release tablet Take 1-2 tablets (5-10 mg total) by mouth every 4 (four) hours as needed for moderate pain. 06/23/19   Armandina Gemma, MD  pantoprazole (PROTONIX) 40 MG tablet Take 80 mg by mouth daily.     [provider]  pregabalin (LYRICA) 25 MG capsule Take 25 mg by mouth 3 (three) times daily as needed (neuropathy).  05/21/19   [provider]  QUEtiapine (SEROQUEL) 25 MG tablet Take 25 mg by mouth at bedtime. 11/15/19   [provider]  tiZANidine (ZANAFLEX) 4 MG tablet Take 4 mg by mouth 3 (three) times  daily as needed. 11/14/19   [provider]    Physical Exam:  Vitals:   11/22/21 0130 11/22/21 0200 11/22/21 0300 11/22/21 0329  BP: 137/86 133/83 132/75   Pulse: (!) 123 (!) 119 (!) 115   Resp: '18 14 17   '$ Temp:    98.2 F (36.8 C)  TempSrc:    Oral  SpO2: 97% 98% 96%   Weight:      Height:        Constitutional: Awake alert and oriented x3, patient is in mild respiratory distress. Skin: no rashes, no lesions, good skin turgor noted. Eyes: Pupils are equally reactive to light.  No evidence of scleral icterus or conjunctival pallor.  ENMT: Moist mucous membranes noted.  Posterior pharynx clear of any exudate or lesions.   Neck: normal, supple, no masses, no thyromegaly.  No evidence of jugular venous distension.   Respiratory: clear to auscultation bilaterally, no wheezing, no crackles. Normal respiratory effort. No accessory muscle use.  Cardiovascular: Regular rate and rhythm, no murmurs / rubs / gallops. No extremity edema. 2+ pedal pulses. No carotid bruits.  Chest:   Nontender without crepitus  or deformity.   Back:   Nontender without crepitus or deformity. Abdomen: Abdomen is soft and nontender.  No evidence of intra-abdominal masses.  Positive bowel sounds noted in all quadrants.   Musculoskeletal: No joint deformity upper and lower extremities. Good ROM, no contractures. Normal muscle tone.  Neurologic: CN 2-12 grossly intact. Sensation intact.  Patient moving all 4 extremities spontaneously.  Patient is following all commands.  Patient is responsive to verbal stimuli.   Psychiatric: Patient exhibits normal mood with appropriate affect.  Patient seems to possess insight as to their current situation.    Data Reviewed:  I have personally reviewed and interpreted labs, imaging.  Significant findings are:  CT angiogram of the chest revealing faint intraluminal density in the subsegmental right upper lobe pulmonary artery as well as an additional small focus of  density along the periphery of the right lower lobe pulmonary artery. D-dimer 1.24 BNP 4.8 First troponin 4, second troponin 3  EKG: Personally reviewed.  Rhythm is sinus tachycardia with heart rate of 109 bpm.  No dynamic ST segment changes appreciated.   Assessment and Plan: * Acute pulmonary embolism ([place]) CT angiogram reveals evidence of subsegmental pulmonary emboli of the right upper and right lower lobes Initially this was thought to possibly be artifact however in the setting of substantial sinus tachycardia in the emergency department as well as significant dyspnea with exertion clinically these are felt to be pulmonary emboli. Considering the equivocal CT findings, obtaining bilateral lower extremity ultrasound to identify any further evidence of thromboembolism in the legs No evidence of right heart strain on CT imaging Patient has been placed on Heparin infusion by ER provider which is being continued for now. Echocardiogram will be obtained in the morning and once it is confirmed that there is no significant right heart strain patient can likely be transitioned back to an oral anticoagulant Monitoring patient on telemetry Monitoring cardiac enzymes.  Supplemental oxygen for bouts of hypoxia   Essential hypertension Resume patients home regimen of oral antihypertensives Titrate antihypertensive regimen as necessary to achieve adequate BP control PRN intravenous antihypertensives for excessively elevated blood pressure    GERD without esophagitis Continuing home regimen of daily PPI therapy.        Code Status:  Full code  code status decision has been confirmed with: patient Family Communication: deferred   Consults: None  Severity of Illness:  The appropriate patient status for this patient is OBSERVATION. Observation status is judged to be reasonable and necessary in order to provide the required intensity of service to ensure the patient's safety. The  patient's presenting symptoms, physical exam findings, and initial radiographic and laboratory data in the context of their medical condition is felt to place them at decreased risk for further clinical deterioration. Furthermore, it is anticipated that the patient will be medically stable for discharge from the hospital within 2 midnights of admission.   Author:  Vernelle Emerald MD  11/22/2021 6:49 AM

## 2021-11-22 NOTE — Progress Notes (Signed)
[facility] OF CARE NOTE Patient: Sierra Krueger RPR:[id number]   PCP: Dulce Sellar, MD DOB: October 30, 1955   DOA: 11/21/2021   DOS: 11/22/2021    Patient was admitted by my colleague earlier on 11/22/2021. I have reviewed the H&P as well as assessment and plan and agree with the same. Important changes in the plan are listed below.  Plan of care: Principal Problem:   Acute pulmonary embolism (HCC) Active Problems:   Essential hypertension   GERD without esophagitis   Pulmonary emboli (HCC)   The patient is significantly symptomatic and hemodynamically unstable for the size of the PE that she has. At present I will continue to observe the patient continue with IV heparin and monitor and further work-up. Attempt to identify any other etiology so far is negative for infection.  Monitor.  Author: Berle Mull, MD [facility] 11/22/2021 6:42 PM   If 7PM-7AM, please contact night-coverage at [email or web address]

## 2021-11-22 NOTE — Assessment & Plan Note (Signed)
.   Resume patients home regimen of oral antihypertensives . Titrate antihypertensive regimen as necessary to achieve adequate BP control . PRN intravenous antihypertensives for excessively elevated blood pressure   

## 2021-11-22 NOTE — ED Notes (Signed)
Pt ambulated in the hallway with a walker. Pt O2 sat remained 95 and above. Pt HR started at 122 and rose to 147. Pt gait was normal and steady.

## 2021-11-22 NOTE — Progress Notes (Signed)
ANTICOAGULATION CONSULT NOTE   Pharmacy Consult for Heparin Indication: pulmonary embolus  Allergies  Allergen Reactions   Codeine Hives   Morphine And Related Itching and Nausea And Vomiting   Penicillin G Other (See Comments)   Shellfish Allergy Hives and Swelling    Patient Measurements: Height: '5\' 5"'$  (165.1 cm) Weight: 73.1 kg (161 lb 2.5 oz) IBW/kg (Calculated) : 57 Heparin Dosing Weight: 69.8 kg  Vital Signs: Temp: 99 F (37.2 C) (07/30 1800) Temp Source: Oral (07/30 1339) BP: 147/84 (07/30 1800) Pulse Rate: 128 (07/30 1800)  Labs: Recent Labs    11/21/21 2042 11/21/21 2248 11/22/21 0800 11/22/21 1135 11/22/21 1828  HGB 11.1*  --  11.0*  --   --   HCT 34.0*  --  35.1*  --   --   PLT 266  --  279  --   --   HEPARINUNFRC  --   --   --  0.69 0.77*  CREATININE 1.24*  --  1.07*  --   --   TROPONINIHS 4 3  --   --   --      Estimated Creatinine Clearance: 53.2 mL/min (A) (by C-G formula based on SCr of 1.07 mg/dL (H)).   Medical History: Past Medical History:  Diagnosis Date   Ambulates with cane    Anxiety    CKD (chronic kidney disease), stage II    Decreased peripheral vision of both eyes    left side due to CVA 2007   History of CVA with residual deficit 12/2005   large right PCA territory subacute infarct without hemorrhage w/ left sided upper/lower extremity weakness and left peripheral vision loss   Hypertension    Left-sided weakness    upper/ lower extremity,  CVA residual 2007   Renal calculus, bilateral    Stroke ([place]) 2007   lt side weakness.   Wears partial dentures    upper    Medications:  Medications Prior to Admission  Medication Sig Dispense Refill Last Dose   amLODipine (NORVASC) 5 MG tablet Take 5 mg by mouth daily.      aspirin 325 MG tablet Take 325 mg by mouth daily.      cetirizine (ZYRTEC) 10 MG tablet Take 10 mg by mouth daily as needed for allergies.      chlorproMAZINE (THORAZINE) 200 MG tablet Take 200 mg by mouth at  bedtime.      diclofenac Sodium (VOLTAREN) 1 % GEL Apply 2 g topically 2 (two) times daily.      diltiazem (CARDIZEM) 30 MG tablet Take 30 mg by mouth 2 (two) times daily.      fluticasone (FLONASE) 50 MCG/ACT nasal spray Place 1 spray into both nostrils daily as needed for allergies or rhinitis.      hydrOXYzine (ATARAX) 10 MG tablet Take 10 mg by mouth 3 (three) times daily as needed.      lamoTRIgine (LAMICTAL) 100 MG tablet Take 100 mg by mouth daily.      lamoTRIgine (LAMICTAL) 150 MG tablet Take 300 mg by mouth daily.      lidocaine (LIDODERM) 5 % Place 2 patches onto the skin daily.       losartan (COZAAR) 25 MG tablet Take 25 mg by mouth daily.      mirtazapine (REMERON) 15 MG tablet Take 15 mg by mouth at bedtime.      oxyCODONE (OXY IR/ROXICODONE) 5 MG immediate release tablet Take 1-2 tablets (5-10 mg total) by mouth every 4 (four)  hours as needed for moderate pain. 15 tablet 0    pantoprazole (PROTONIX) 20 MG tablet Take 20 mg by mouth daily.      pregabalin (LYRICA) 25 MG capsule Take 25 mg by mouth 3 (three) times daily as needed (neuropathy).       QUEtiapine (SEROQUEL) 25 MG tablet Take 25 mg by mouth at bedtime.      rosuvastatin (CRESTOR) 10 MG tablet Take 10 mg by mouth daily.      tiZANidine (ZANAFLEX) 4 MG tablet Take 4 mg by mouth 3 (three) times daily as needed.       Scheduled:   chlorproMAZINE  200 mg Oral QHS   diclofenac Sodium  2 g Topical BID   [START ON 11/23/2021] diltiazem  30 mg Oral BID   lamoTRIgine  300 mg Oral Daily   mirtazapine  15 mg Oral QHS   pantoprazole  20 mg Oral Daily   QUEtiapine  25 mg Oral QHS   rosuvastatin  10 mg Oral Daily   Infusions:   sodium chloride 1,000 mL (11/22/21 1401)   heparin 1,050 Units/hr (11/22/21 1300)   PRN: acetaminophen **OR** acetaminophen, fluticasone, hydrOXYzine, ondansetron **OR** ondansetron (ZOFRAN) IV, oxyCODONE, polyethylene glycol, pregabalin, tiZANidine  Assessment: 61 yof with a history of HTN,  stroke (Rt PCA 2007), GERD. Patient is presenting with SOB. Heparin per pharmacy consult placed for pulmonary embolus.  Patient was not on anticoagulation prior to arrival.  Heparin drip 1050 uts/hr with heparin level > goal at 0.77  CTA PE w/ subsegmental pulmonary emboli of the right upper and right lower lobes  Hgb 11.1>11; plt 266>279 D-Dimer 1.24  Goal of Therapy:  Heparin level 0.3-0.7 units/ml Monitor platelets by anticoagulation protocol: Yes   Plan:  decrease heparin infusion to 900 units/hr Daily cbc and heparin level  Continue to monitor H&H and platelets F/u  transition to oral anticoagulation    Bonnita Nasuti Pharm.D. CPP, BCPS Clinical Pharmacist [phone] 11/22/2021 7:51 PM   ED Clinical Pharmacist -  [phone]

## 2021-11-22 NOTE — Progress Notes (Addendum)
ANTICOAGULATION CONSULT NOTE   Pharmacy Consult for Heparin Indication: pulmonary embolus  Allergies  Allergen Reactions   Codeine Hives   Morphine And Related Itching and Nausea And Vomiting   Penicillin G Other (See Comments)   Shellfish Allergy Hives and Swelling    Patient Measurements: Height: '5\' 4"'$  (162.6 cm) Weight: 73 kg (160 lb 15 oz) IBW/kg (Calculated) : 54.7 Heparin Dosing Weight: 69.8 kg  Vital Signs: Temp: 98.5 F (36.9 C) (07/30 0700) Temp Source: Oral (07/30 0329) BP: 148/77 (07/30 0700) Pulse Rate: 119 (07/30 0700)  Labs: Recent Labs    11/21/21 2042 11/21/21 2248 11/22/21 0800  HGB 11.1*  --  11.0*  HCT 34.0*  --  35.1*  PLT 266  --  279  CREATININE 1.24*  --  1.07*  TROPONINIHS 4 3  --     Estimated Creatinine Clearance: 52 mL/min (A) (by C-G formula based on SCr of 1.07 mg/dL (H)).   Medical History: Past Medical History:  Diagnosis Date   Ambulates with cane    Anxiety    CKD (chronic kidney disease), stage II    Decreased peripheral vision of both eyes    left side due to CVA 2007   History of CVA with residual deficit 12/2005   large right PCA territory subacute infarct without hemorrhage w/ left sided upper/lower extremity weakness and left peripheral vision loss   Hypertension    Left-sided weakness    upper/ lower extremity,  CVA residual 2007   Renal calculus, bilateral    Stroke ([place]) 2007   lt side weakness.   Wears partial dentures    upper    Medications:  (Not in a hospital admission)  Scheduled:   amLODipine  5 mg Oral Daily   lamoTRIgine  100 mg Oral Daily   mirtazapine  15 mg Oral QHS   pantoprazole  40 mg Oral Daily   QUEtiapine  25 mg Oral QHS   Infusions:   sodium chloride 1,000 mL (11/22/21 0133)   heparin 1,100 Units/hr (11/22/21 0944)   PRN: acetaminophen **OR** acetaminophen, fluticasone, ondansetron **OR** ondansetron (ZOFRAN) IV, polyethylene glycol, pregabalin  Assessment: 34 yof with a history  of HTN, stroke (Rt PCA 2007), GERD. Patient is presenting with SOB. Heparin per pharmacy consult placed for pulmonary embolus.  Patient was not on anticoagulation prior to arrival. Started on 1100 units/hr heparin infusion following 4000 unit IV heparin bolus.  Heparin level is 0.69 which is therapeutic.  CTA PE w/ subsegmental pulmonary emboli of the right upper and right lower lobes  Hgb 11.1>11; plt 266>279 D-Dimer 1.24  Goal of Therapy:  Heparin level 0.3-0.7 units/ml Monitor platelets by anticoagulation protocol: Yes   Plan:  Level is borderline supra-therapeutic  Slightly decrease heparin infusion to 1050 units/hr Check anti-Xa level at 1900 and daily while on heparin Continue to monitor H&H and platelets Planning for transition to oral anticoagulation once ECHO completed rules out RHS  Lorelei Pont, PharmD, BCPS 11/22/2021 10:50 AM ED Clinical Pharmacist -  [phone]

## 2021-11-22 NOTE — ED Provider Notes (Signed)
I assumed care of this patient.  Please see previous provider note for further details of Hx, PE.  Briefly patient is a 66 y.o. female who presented SOB.  With reassuring cardiac work-up however D-dimer elevated and pending CTA to assess for pulmonary embolism.  CTA notable for irregularities in the right upper and lower lobes concerning for possible PE. She is tachycardic, normotensive, and not hypoxic. Patient ambulated with pulse ox and was able to maintain her oxygen above 95 however patient's heart rate was up in the 140s in normal sinus tachycardia.  We will discuss case with hospitalist service regarding admission. We will start patient on IV fluids and heparin drip.  Spoke with Dr. Cyd Silence, who agreed to admit patient for further management.  .Critical Care  Performed by: Fatima Blank, MD Authorized by: Fatima Blank, MD   Critical care provider statement:    Critical care time (minutes):  30   Critical care was necessary to treat or prevent imminent or life-threatening deterioration of the following conditions:  Circulatory failure   Critical care was time spent personally by me on the following activities:  Development of treatment plan with patient or surrogate, discussions with consultants, evaluation of patient's response to treatment, examination of patient, ordering and review of laboratory studies, ordering and review of radiographic studies, ordering and performing treatments and interventions, pulse oximetry, re-evaluation of patient's condition and review of old charts   Care discussed with: admitting provider           Morey Andonian, Grayce Sessions, MD 11/22/21 [phone]

## 2021-11-22 NOTE — ED Notes (Signed)
The pt keeps both her eyes closed she never looks up whenever anyone talks to her

## 2021-11-22 NOTE — ED Provider Notes (Signed)
[facility] EMERGENCY DEPARTMENT Provider Note   CSN: [id number] Arrival date & time: 11/21/21  2024     History  Chief Complaint  Patient presents with   Shortness of Breath    Sierra Krueger is a 67 y.o. female.  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  Plan prior to arrival, she began to feel short of breath.  Reports that she feels like she cannot "get her second wind."  Denies chest pain, nausea, vomiting, diarrhea, black or bloody stools.  Denies cough, fever, leg pain or swelling, recent surgeries or immobilization, long trips or car airplane.  No history of PE or DVT, no history of heart disease, smoking, drinking, drug use, or history of lung disease.   Past Medical History:  Diagnosis Date   Ambulates with cane    Anxiety    CKD (chronic kidney disease), stage II    Decreased peripheral vision of both eyes    left side due to CVA 2007   History of CVA with residual deficit 12/2005   large right PCA territory subacute infarct without hemorrhage w/ left sided upper/lower extremity weakness and left peripheral vision loss   Hypertension    Left-sided weakness    upper/ lower extremity,  CVA residual 2007   Renal calculus, bilateral    Stroke ([place]) 2007   lt side weakness.   Wears partial dentures    upper    Past Surgical History:  Procedure Laterality Date   BREAST LUMPECTOMY Left 1997   benign tumor    CYSTOSCOPY/URETEROSCOPY/HOLMIUM LASER/STENT PLACEMENT Bilateral 05/15/2018   Procedure: CYSTOSCOPY/RETROGRADE/URETEROSCOPY/HOLMIUM LASER/BASKET STONE EXTRACTION,  LEFT STENT REPLACEMENT, RIGHT STENT PLACEMENT;  Surgeon: Kathie Rhodes, MD;  Location: [facility];  Service: Urology;  Laterality: Bilateral;   FOOT SURGERY Left 2010   HOLMIUM LASER APPLICATION Bilateral 2/42/3536   Procedure: HOLMIUM LASER APPLICATION;  Surgeon:  Kathie Rhodes, MD;  Location: [facility];  Service: Urology;  Laterality: Bilateral;   IR URETERAL STENT LEFT NEW ACCESS W/O SEP NEPHROSTOMY CATH  04/24/2018   LUMBAR SPINE SURGERY  2002   L3 -- 5   NEPHROLITHOTOMY Left 04/24/2018   Procedure: NEPHROLITHOTOMY PERCUTANEOUS;  Surgeon: Kathie Rhodes, MD;  Location: [facility];  Service: Urology;  Laterality: Left;   PARATHYROIDECTOMY N/A 06/22/2019   Procedure: NECK EXPLORATION WITH PARATHYROIDECTOMY WITH PARATHYROID BIOPSY;  Surgeon: Armandina Gemma, MD;  Location: [facility];  Service: General;  Laterality: N/A;   TROCHANTERIC BURSA EXCISION Left 09-02-2016    dr Ninfa Linden  '@[facility]'$      Home Medications Prior to Admission medications   Medication Sig Start Date End Date Taking? Authorizing Provider  amLODipine (NORVASC) 5 MG tablet Take 5 mg by mouth daily. 09/03/19   [provider]  aspirin 325 MG tablet Take 325 mg by mouth daily.    [provider]  cetirizine (ZYRTEC) 10 MG tablet Take 10 mg by mouth daily as needed for allergies.    [provider]  fluticasone (FLONASE) 50 MCG/ACT nasal spray Place 1 spray into both nostrils daily as needed for allergies or rhinitis.    [provider]  lamoTRIgine (LAMICTAL) 100 MG tablet Take 100 mg by mouth daily. 11/19/19   [provider]  lidocaine (LIDODERM) 5 % Place 2 patches onto the skin daily.  05/21/19   [provider]  mirtazapine (REMERON) 15 MG tablet Take 15 mg by mouth at bedtime. 10/08/19   [provider]  oxyCODONE (OXY IR/ROXICODONE) 5 MG immediate release tablet Take 1-2 tablets (5-10 mg total) by mouth every 4 (four) hours as needed for moderate pain. 06/23/19   Armandina Gemma, MD  pantoprazole (PROTONIX) 40 MG tablet Take 80 mg by mouth daily.     [provider]  pregabalin (LYRICA) 25 MG capsule Take 25 mg by mouth 3 (three) times daily as needed (neuropathy).  05/21/19   [provider]  QUEtiapine  (SEROQUEL) 25 MG tablet Take 25 mg by mouth at bedtime. 11/15/19   [provider]  tiZANidine (ZANAFLEX) 4 MG tablet Take 4 mg by mouth 3 (three) times daily as needed. 11/14/19   [provider]      Allergies    Codeine, Morphine and related, Penicillin g, and Shellfish allergy    Review of Systems   Review of Systems  Physical Exam Updated Vital Signs BP 135/78 (BP Location: Right Arm)   Pulse (!) 106   Temp 98.1 F (36.7 C) (Oral)   Resp 15   Ht '5\' 4"'$  (1.626 m)   Wt 73 kg   SpO2 97%   BMI 27.62 kg/m  Physical Exam Vitals and nursing note reviewed.  Constitutional:      General: She is not in acute distress.    Appearance: She is well-developed. She is not diaphoretic.  HENT:     Head: Normocephalic and atraumatic.  Eyes:     Conjunctiva/sclera: Conjunctivae normal.  Cardiovascular:     Rate and Rhythm: Normal rate and regular rhythm.     Heart sounds: Normal heart sounds. No murmur heard.    No friction rub. No gallop.  Pulmonary:     Effort: Pulmonary effort is normal. No respiratory distress.     Breath sounds: Normal breath sounds. No wheezing or rales.  Abdominal:     General: There is no distension.     Palpations: Abdomen is soft.     Tenderness: There is no abdominal tenderness. There is no guarding.  Musculoskeletal:        General: No tenderness.     Cervical back: Normal range of motion.  Skin:    General: Skin is warm and dry.     Findings: No erythema or rash.  Neurological:     Mental Status: She is alert and oriented to person, place, and time.     ED Results / Procedures / Treatments   Labs (all labs ordered are listed, but only abnormal results are displayed) Labs Reviewed  CBC WITH DIFFERENTIAL/PLATELET - Abnormal; Notable for the following components:      Result Value   RBC 3.82 (*)    Hemoglobin 11.1 (*)    HCT 34.0 (*)    All other components within normal limits  COMPREHENSIVE METABOLIC PANEL - Abnormal; Notable  for the following components:   Glucose, Bld 108 (*)    Creatinine, Ser 1.24 (*)    Albumin 3.4 (*)    AST 14 (*)    GFR, Estimated 49 (*)    All other components within normal limits  D-DIMER, QUANTITATIVE - Abnormal; Notable for the following components:   D-Dimer, Quant 1.24 (*)    All other components within normal limits  BRAIN NATRIURETIC PEPTIDE  TROPONIN I (HIGH SENSITIVITY)  TROPONIN I (HIGH SENSITIVITY)    EKG None  Radiology CT Angio Chest PE W and/or Wo Contrast  Result Date: 11/21/2021 CLINICAL DATA:  Positive D-dimer.  Concern for pulmonary embolism. EXAM: CT ANGIOGRAPHY CHEST WITH CONTRAST TECHNIQUE: Multidetector CT imaging of the chest was performed using the standard protocol during bolus administration of intravenous contrast. Multiplanar CT image reconstructions and MIPs were obtained to evaluate the vascular anatomy. RADIATION DOSE REDUCTION: This exam was performed according to the departmental dose-optimization program which includes automated exposure control, adjustment of the mA and/or kV according to patient size and/or use of iterative reconstruction technique. CONTRAST:  83m OMNIPAQUE IOHEXOL 350 MG/ML SOLN COMPARISON:  Chest radiograph dated 11/21/2021. FINDINGS: Cardiovascular: There is no cardiomegaly or pericardial effusion. The thoracic aorta is unremarkable. The origins of the great vessels of the aortic arch appear patent. Apparent faint intraluminal density in the subsegmental right upper lobe pulmonary artery branch (168/7 and coronal 55/8) may be artifactual or related to mixing of the contrast. A small age indeterminate pulmonary embolism is not entirely excluded. Additional small focus of density along the periphery of the right lower lobe pulmonary artery branch (181/7) may be extravascular or represent an age indeterminate, possibly subacute or old PE or scarring. Clinical correlation is recommended. No large or central pulmonary artery embolus  identified. Mediastinum/Nodes: Mildly enlarged right hilar lymph nodes measure up to 13 mm. No mediastinal adenopathy. There is a moderate size hiatal hernia. The esophagus is grossly unremarkable. Multiple bilateral thyroid nodules measure up to 1.6 cm on the right. This has been evaluated on previous imaging. (ref: J Am Coll Radiol. 2015 Feb;12(2): 143-50).No mediastinal fluid collection. Lungs/Pleura: No focal consolidation, pleural effusion, or pneumothorax. There is mild eventration of the right hemidiaphragm with mild right lung base atelectasis. The central airways are patent. Upper Abdomen: No acute abnormality. Musculoskeletal: No chest wall abnormality. No acute or significant osseous findings. Review of the MIP images confirms the above findings. IMPRESSION: 1. No CT evidence of central pulmonary artery embolus. Artifact versus possible small age indeterminate subsegmental emboli in the right upper and right lower lobes. 2. Mildly enlarged right hilar lymph nodes, nonspecific. 3. Moderate size hiatal hernia. Electronically Signed   By: AAnner CreteM.D.   On: 11/21/2021 23:59   DG Chest Portable 1 View  Result Date: 11/21/2021 CLINICAL DATA:  Shortness of breath EXAM: PORTABLE CHEST 1 VIEW COMPARISON:  None Available. FINDINGS: Cardiac shadows within normal limits. Lungs are well aerated bilaterally. Minimal basilar atelectasis is seen. No sizable effusion is noted. No bony abnormality is noted. IMPRESSION: Mild bibasilar atelectasis. Electronically Signed   By: MInez CatalinaM.D.   On: 11/21/2021 21:59    Procedures Procedures    Medications Ordered in ED Medications  iohexol (OMNIPAQUE) 350 MG/ML injection 80 mL (80 mLs Intravenous Contrast Given 11/21/21 2343)    ED Course/ Medical Decision Making/ A&P                           Medical Decision Making Amount and/or Complexity of Data Reviewed Labs: ordered. Radiology: ordered.  Risk Prescription drug management.   66yo female with hx of CKD stage II, decreased vision due to CVA, CVA, hx of recurrent left hip pain, concern for shortness of breath.    Differential diagnosis for dyspnea includes ACS, PE, COPD exacerbation, CHF exacerbation, anemia, pneumonia, viral etiology such as COVID 19 infection, metabolic abnormality.  Chest x-ray was done and evaluated by me which showed bibasilar atelectasis. EKG was evaluated by me which showed normal sinus rhythm.  BNP was 4.8.  Denies CP, troponin negative.  DDimer positive and will scan for PE.  Signed out to Dr. Leonette Monarch with CT and repeat troponin pending.        Final Clinical Impression(s) / ED Diagnoses Final diagnoses:  SOB (shortness of breath)    Rx / DC Orders ED Discharge Orders     None         Gareth Morgan, MD 11/22/21 [phone]

## 2021-11-22 NOTE — Progress Notes (Signed)
VASCULAR LAB    Bilateral lower extremity venous duplex has been performed.  See CV proc for preliminary results.   Laquetta Racey, RVT 11/22/2021, 4:44 PM

## 2021-11-22 NOTE — Progress Notes (Signed)
   11/22/21 1339  Assess: MEWS Score  Temp 99.3 F (37.4 C)  BP 140/89  MAP (mmHg) 105  Pulse Rate (!) 120  ECG Heart Rate (!) 120  Resp 20  SpO2 97 %  O2 Device Room Air  Assess: MEWS Score  MEWS Temp 0  MEWS Systolic 0  MEWS Pulse 2  MEWS RR 0  MEWS LOC 0  MEWS Score 2  MEWS Score Color Yellow  Assess: if the MEWS score is Yellow or Red  Were vital signs taken at a resting state? Yes  Focused Assessment No change from prior assessment  Does the patient meet 2 or more of the SIRS criteria? No  MEWS guidelines implemented *See Row Information* Yes  Treat  Pain Scale 0-10  Pain Score 0  Take Vital Signs  Increase Vital Sign Frequency  Yellow: Q 2hr X 2 then Q 4hr X 2, if remains yellow, continue Q 4hrs  Escalate  MEWS: Escalate Yellow: discuss with charge nurse/RN and consider discussing with provider and RRT  Notify: Charge Nurse/RN  Name of Charge Nurse/RN Notified Tai  Date Charge Nurse/RN Notified 11/22/21  Time Charge Nurse/RN Notified 1415  Notify: Provider  Provider Name/Title Patel  Date Provider Notified 11/22/21  Time Provider Notified 1415  Method of Notification Page  Notification Reason Other (Comment) (arrival to unit in a yellow mews.)  Provider response No new orders  Date of Provider Response 11/22/21  Time of Provider Response 1415  Assess: SIRS CRITERIA  SIRS Temperature  0  SIRS Pulse 1  SIRS Respirations  0  SIRS WBC 1  SIRS Score Sum  2

## 2021-11-22 NOTE — Assessment & Plan Note (Signed)
   CT angiogram reveals evidence of subsegmental pulmonary emboli of the right upper and right lower lobes  Initially this was thought to possibly be artifact however in the setting of substantial sinus tachycardia in the emergency department as well as significant dyspnea with exertion clinically these are felt to be pulmonary emboli.  Considering the equivocal CT findings, obtaining bilateral lower extremity ultrasound to identify any further evidence of thromboembolism in the legs  No evidence of right heart strain on CT imaging  Patient has been placed on Heparin infusion by ER provider which is being continued for now.  Echocardiogram will be obtained in the morning and once it is confirmed that there is no significant right heart strain patient can likely be transitioned back to an oral anticoagulant  Monitoring patient on telemetry  Monitoring cardiac enzymes.   Supplemental oxygen for bouts of hypoxia

## 2021-11-23 ENCOUNTER — Inpatient Hospital Stay (HOSPITAL_COMMUNITY): Payer: Medicare Other

## 2021-11-23 DIAGNOSIS — I2693 Single subsegmental pulmonary embolism without acute cor pulmonale: Secondary | ICD-10-CM | POA: Diagnosis not present

## 2021-11-23 DIAGNOSIS — I2699 Other pulmonary embolism without acute cor pulmonale: Secondary | ICD-10-CM | POA: Diagnosis not present

## 2021-11-23 LAB — CBC
HCT: 34.5 % — ABNORMAL LOW (ref 36.0–46.0)
Hemoglobin: 11.3 g/dL — ABNORMAL LOW (ref 12.0–15.0)
MCH: 28.5 pg (ref 26.0–34.0)
MCHC: 32.8 g/dL (ref 30.0–36.0)
MCV: 86.9 fL (ref 80.0–100.0)
Platelets: 276 10*3/uL (ref 150–400)
RBC: 3.97 MIL/uL (ref 3.87–5.11)
RDW: 14.5 % (ref 11.5–15.5)
WBC: 8.5 10*3/uL (ref 4.0–10.5)
nRBC: 0 % (ref 0.0–0.2)

## 2021-11-23 LAB — HEPARIN LEVEL (UNFRACTIONATED): Heparin Unfractionated: 0.66 IU/mL (ref 0.30–0.70)

## 2021-11-23 LAB — ECHOCARDIOGRAM COMPLETE
Height: 65 in
S' Lateral: 2.4 cm
Weight: 2567.92 oz

## 2021-11-23 MED ORDER — DILTIAZEM HCL 30 MG PO TABS
30.0000 mg | ORAL_TABLET | Freq: Four times a day (QID) | ORAL | Status: DC
  Administered 2021-11-23: 30 mg via ORAL
  Filled 2021-11-23: qty 1

## 2021-11-23 NOTE — Progress Notes (Signed)
ANTICOAGULATION CONSULT NOTE   Pharmacy Consult for Heparin Indication: pulmonary embolus  Allergies  Allergen Reactions   Codeine Hives   Morphine And Related Itching and Nausea And Vomiting   Penicillin G Other (See Comments)   Shellfish Allergy Hives and Swelling   Labs: Recent Labs    11/21/21 2042 11/21/21 2248 11/22/21 0800 11/22/21 1135 11/22/21 1828 11/23/21 0656  HGB 11.1*  --  11.0*  --   --  11.3*  HCT 34.0*  --  35.1*  --   --  34.5*  PLT 266  --  279  --   --  276  HEPARINUNFRC  --   --   --  0.69 0.77* 0.66  CREATININE 1.24*  --  1.07*  --   --   --   TROPONINIHS 4 3  --   --   --   --      Estimated Creatinine Clearance: 53.1 mL/min (A) (by C-G formula based on SCr of 1.07 mg/dL (H)).  Assessment: 8 yof with a history of HTN, stroke (Rt PCA 2007), GERD. Patient is presenting with SOB. Heparin per pharmacy consult placed for pulmonary embolus.  Patient was not on anticoagulation prior to arrival.  Heparin drip 1050 uts/hr with heparin level > goal at 0.77  CTA PE w/ subsegmental pulmonary emboli of the right upper and right lower lobes  Heparin level therapeutic this AM  Goal of Therapy:  Heparin level 0.3-0.7 units/ml Monitor platelets by anticoagulation protocol: Yes   Plan:  Continue heparin infusion at 900 units/hr Daily cbc and heparin level  Continue to monitor H&H and platelets F/u  transition to oral anticoagulation    Thank you Anette Guarneri, PharmD 11/23/2021 7:33 AM

## 2021-11-23 NOTE — Progress Notes (Signed)
  Progress Note Patient: Sierra Krueger KKX:[id number] DOB: 03/09/1956 DOA: 11/21/2021  DOS: the patient was seen and examined on 11/23/2021  Brief hospital course: Past medical history of HTN, CVA, GERD.  Presented to hospital with complaints of shortness of breath.  Found to have very small acute PE.  The patient is significantly symptomatic and hemodynamically unstable for the size of the PE that she has. At present I will continue to observe the patient continue with IV heparin and monitor and further work-up.so far no evidence of bacterial infection.  COVID-19 negative.  Echocardiogram shows preserved EF without any wall motion abnormality.  Assessment and Plan: Acute pulmonary embolism (HCC) CT angiogram reveals evidence of subsegmental pulmonary emboli of the right upper and right lower lobes. Initially this was thought to possibly be artifact however in the setting of substantial sinus tachycardia in the emergency department as well as significant dyspnea with exertion clinically these are felt to be pulmonary emboli. Currently on IV heparin which I will continue. Family currently leaning towards Eliquis but undecided yet on home anticoagulation. Lower extremity Doppler negative for DVT. Echocardiogram shows no RV dysfunction. Troponins are negative.  BNP negative. For now we will continue anticoagulation for 3 months and monitor outpatient.  Sinus tachycardia. Etiology could be PE although not clear. Infection work-up is negative. No evidence of dehydration or any acute intra-abdominal pathology. Check TSH and free T4. Echocardiogram is reassuring. Increase Cardizem dose from 30 twice daily to 30 4 times daily.  Essential hypertension Blood pressure stable. Continue current regimen.  GERD without esophagitis Continuing home regimen of daily PPI therapy.  Subjective: No nausea no vomiting or no fever no chills.  No chest pain.  No abdominal pain.  Remains tachycardic and  tachypneic.  Physical Exam: Vitals:   11/23/21 0406 11/23/21 0500 11/23/21 0725 11/23/21 0918  BP: (!) 141/86  136/84   Pulse: (!) 121 (!) 109 (!) 110 (!) 116  Resp: (!) '21 17 17 15  '$ Temp: 99 F (37.2 C)     TempSrc: Oral     SpO2: 95% 94% 96% 93%  Weight:      Height:       General: Appear in mild distress; no visible Abnormal Neck Mass Or lumps, Conjunctiva normal Cardiovascular: S1 and S2 Present, no Murmur, Respiratory: good respiratory effort, Bilateral Air entry present and CTA, no Crackles, no wheezes Abdomen: Bowel Sound present, Non tender  Extremities: no Pedal edema Neurology: alert and oriented to time, place, and person  Gait not checked due to patient safety concerns   Data Reviewed: I have Reviewed nursing notes, Vitals, and Lab results since pt's last encounter. Pertinent lab results CBC, BMP, echocardiogram, lower extremity Doppler I have ordered test including CBC and BMP TSH and free T4    Family Communication: Daughter and son at bedside  Disposition: Status is: Inpatient Remains inpatient appropriate because: Currently on IV heparin for therapeutic anticoagulation  Author: Berle Mull, MD 11/23/2021 7:30 PM  Please look on [email or web address] to find out who is on call.

## 2021-11-23 NOTE — Hospital Course (Signed)
Past medical history of HTN, CVA, GERD.  Presented to hospital with complaints of shortness of breath.  Found to have very small acute PE.  The patient is significantly symptomatic and hemodynamically unstable for the size of the PE that she has. At present I will continue to observe the patient continue with IV heparin and monitor and further work-up.so far no evidence of bacterial infection.  COVID-19 negative.  Echocardiogram shows preserved EF without any wall motion abnormality.

## 2021-11-24 ENCOUNTER — Other Ambulatory Visit (HOSPITAL_COMMUNITY): Payer: Self-pay

## 2021-11-24 ENCOUNTER — Telehealth (HOSPITAL_COMMUNITY): Payer: Self-pay

## 2021-11-24 LAB — TSH: TSH: 2.343 u[IU]/mL (ref 0.350–4.500)

## 2021-11-24 LAB — CBC
HCT: 34.7 % — ABNORMAL LOW (ref 36.0–46.0)
Hemoglobin: 11.4 g/dL — ABNORMAL LOW (ref 12.0–15.0)
MCH: 28.4 pg (ref 26.0–34.0)
MCHC: 32.9 g/dL (ref 30.0–36.0)
MCV: 86.5 fL (ref 80.0–100.0)
Platelets: 277 10*3/uL (ref 150–400)
RBC: 4.01 MIL/uL (ref 3.87–5.11)
RDW: 14.4 % (ref 11.5–15.5)
WBC: 7.5 10*3/uL (ref 4.0–10.5)
nRBC: 0 % (ref 0.0–0.2)

## 2021-11-24 LAB — T4, FREE: Free T4: 0.8 ng/dL (ref 0.61–1.12)

## 2021-11-24 LAB — HEPARIN LEVEL (UNFRACTIONATED): Heparin Unfractionated: 1.04 IU/mL — ABNORMAL HIGH (ref 0.30–0.70)

## 2021-11-24 MED ORDER — APIXABAN 5 MG PO TABS
10.0000 mg | ORAL_TABLET | Freq: Two times a day (BID) | ORAL | Status: DC
  Administered 2021-11-24: 10 mg via ORAL
  Filled 2021-11-24: qty 2

## 2021-11-24 MED ORDER — DILTIAZEM HCL ER COATED BEADS 120 MG PO CP24
120.0000 mg | ORAL_CAPSULE | Freq: Every day | ORAL | Status: DC
  Administered 2021-11-24: 120 mg via ORAL
  Filled 2021-11-24: qty 1

## 2021-11-24 MED ORDER — APIXABAN 5 MG PO TABS: 5.0000 mg | ORAL_TABLET | Freq: Two times a day (BID) | ORAL | Status: DC

## 2021-11-24 MED ORDER — APIXABAN (ELIQUIS) VTE STARTER PACK (10MG AND 5MG)
ORAL_TABLET | ORAL | 0 refills | Status: DC
  Filled 2021-11-24: qty 74, 30d supply, fill #0

## 2021-11-24 MED ORDER — HEPARIN (PORCINE) 25000 UT/250ML-% IV SOLN
750.0000 [IU]/h | INTRAVENOUS | Status: DC
  Administered 2021-11-24: 750 [IU]/h via INTRAVENOUS

## 2021-11-24 MED ORDER — DILTIAZEM HCL ER COATED BEADS 120 MG PO CP24
120.0000 mg | ORAL_CAPSULE | Freq: Every day | ORAL | 0 refills | Status: DC
  Filled 2021-11-24: qty 30, 30d supply, fill #0

## 2021-11-24 NOTE — Progress Notes (Signed)
ANTICOAGULATION CONSULT NOTE   Pharmacy Consult for Heparin Indication: pulmonary embolus  Allergies  Allergen Reactions   Codeine Hives   Morphine And Related Itching and Nausea And Vomiting   Penicillin G Other (See Comments)   Shellfish Allergy Hives and Swelling   Labs: Recent Labs    11/21/21 2042 11/21/21 2248 11/22/21 0800 11/22/21 1135 11/22/21 1828 11/23/21 0656 11/24/21 0311  HGB 11.1*  --  11.0*  --   --  11.3* 11.4*  HCT 34.0*  --  35.1*  --   --  34.5* 34.7*  PLT 266  --  279  --   --  276 277  HEPARINUNFRC  --   --   --    < > 0.77* 0.66 1.04*  CREATININE 1.24*  --  1.07*  --   --   --   --   TROPONINIHS 4 3  --   --   --   --   --    < > = values in this interval not displayed.     Estimated Creatinine Clearance: 53.1 mL/min (A) (by C-G formula based on SCr of 1.07 mg/dL (H)).  Assessment: 73 yof with a history of HTN, stroke (Rt PCA 2007), GERD. Patient is presenting with SOB. Heparin per pharmacy consult placed for pulmonary embolus.  Patient was not on anticoagulation prior to arrival.   CTA PE w/ subsegmental pulmonary emboli of the right upper and right lower lobes  AM update: heparin level of 1.04 is supratherapeutic on heparin 900 units/hr. Level drawn appropriately per RN. No bleeding noted.   Goal of Therapy:  Heparin level 0.3-0.7 units/ml Monitor platelets by anticoagulation protocol: Yes   Plan:  Hold heparin infusion x 1 hr Then resume heparin at 750 units/hr  Check 6 hr heparin level  Daily cbc and heparin level  Continue to monitor H&H and platelets F/u  transition to oral anticoagulation    Cristela Felt, PharmD, BCPS Clinical Pharmacist 11/24/2021 4:48 AM

## 2021-11-24 NOTE — Discharge Instructions (Addendum)

## 2021-11-24 NOTE — Evaluation (Signed)
Physical Therapy Evaluation Patient Details Name: Sierra Krueger MRN: [id number] DOB: 04/03/1956 Today's Date: 11/24/2021  History of Present Illness  Pt presented to ED on 7/29 with SOB and found to have PE. PMH - HTN, rt CVA with residual lt weakness, back surgery, ckd, parathyroidectomy.  Clinical Impression  Pt presents to PT needing assist for mobility. Pt reports son has to assist her at home at times with mobility due to deficits from CVA. Pt reports she feels she can return home and does not want HHPT.        Recommendations for follow up therapy are one component of a multi-disciplinary discharge planning process, led by the attending physician.  Recommendations may be updated based on patient status, additional functional criteria and insurance authorization.  Follow Up Recommendations No PT follow up (Pt feels she can return to baseline without HHPT)      Assistance Recommended at Discharge Intermittent Supervision/Assistance  Patient can return home with the following  A little help with walking and/or transfers;A little help with bathing/dressing/bathroom;Assistance with cooking/housework;Assist for transportation    Equipment Recommendations None recommended by PT  Recommendations for Other Services       Functional Status Assessment Patient has had a recent decline in their functional status and demonstrates the ability to make significant improvements in function in a reasonable and predictable amount of time.     Precautions / Restrictions        Mobility  Bed Mobility Overal bed mobility: Needs Assistance Bed Mobility: Supine to Sit     Supine to sit: Mod assist, HOB elevated     General bed mobility comments: Assist to bring LLE off of bed, elevate trunk into sitting, and bring hips to EOB.    Transfers Overall transfer level: Needs assistance Equipment used: Rolling walker (2 wheels) Transfers: Sit to/from Stand Sit to Stand: Mod assist            General transfer comment: Assist to bring hips up    Ambulation/Gait Ambulation/Gait assistance: Min assist Gait Distance (Feet): 100 Feet Assistive device: Rolling walker (2 wheels) Gait Pattern/deviations: Step-through pattern, Decreased stride length, Drifts right/left Gait velocity: decr Gait velocity interpretation: <1.31 ft/sec, indicative of household ambulator   General Gait Details: Assist to guide walker and to keep lt hand on walker  Stairs            Wheelchair Mobility    Modified Rankin (Stroke Patients Only)       Balance Overall balance assessment: Needs assistance Sitting-balance support: Bilateral upper extremity supported, Feet supported Sitting balance-Leahy Scale: Poor Sitting balance - Comments: UE support and lt lateral lean Postural control: Left lateral lean Standing balance support: Bilateral upper extremity supported Standing balance-Leahy Scale: Poor Standing balance comment: walker and min assist for static standing                             Pertinent Vitals/Pain Pain Assessment Pain Assessment: 0-10 Pain Score: 7  Pain Location: back Pain Intervention(s): Monitored during session, Limited activity within patient's tolerance, Repositioned    Home Living Family/patient expects to be discharged to:: Private residence Living Arrangements: Children Available Help at Discharge: Available 24 hours/day Type of Home: Apartment Home Access: Level entry       Home Layout: One level Home Equipment: Conservation officer, nature (2 wheels);Shower seat;Toilet riser      Prior Function Prior Level of Function : Needs assist  Physical Assist : Mobility (physical);ADLs (physical) Mobility (physical): Bed mobility;Transfers ADLs (physical): Bathing;Dressing;Toileting Mobility Comments: Intermittent assist with bed mobility, gait, transfers ADLs Comments: Assist at times with bathing, dressing, toileting     Hand Dominance    Dominant Hand: Right    Extremity/Trunk Assessment   Upper Extremity Assessment Upper Extremity Assessment: LUE deficits/detail LUE Deficits / Details: chronic weakness from CVA    Lower Extremity Assessment Lower Extremity Assessment: LLE deficits/detail LLE Deficits / Details: chronic weakness from CVA       Communication   Communication: No difficulties  Cognition Arousal/Alertness: Awake/alert Behavior During Therapy: Flat affect Overall Cognitive Status: No family/caregiver present to determine baseline cognitive functioning Area of Impairment: Problem solving                             Problem Solving: Slow processing General Comments: likely baseline        General Comments General comments (skin integrity, edema, etc.): SpO2 97-99% on RA with amb. HR 120 at rest 133 wtih amb    Exercises     Assessment/Plan    PT Assessment Patient needs continued PT services  PT Problem List Decreased strength;Decreased balance;Decreased mobility       PT Treatment Interventions DME instruction;Gait training;Functional mobility training;Therapeutic activities;Therapeutic exercise;Balance training;Patient/family education    PT Goals (Current goals can be found in the Care Plan section)  Acute Rehab PT Goals Patient Stated Goal: return home PT Goal Formulation: With patient Time For Goal Achievement: 12/01/21 Potential to Achieve Goals: Good    Frequency Min 3X/week     Co-evaluation               AM-PAC PT "6 Clicks" Mobility  Outcome Measure Help needed turning from your back to your side while in a flat bed without using bedrails?: A Lot Help needed moving from lying on your back to sitting on the side of a flat bed without using bedrails?: A Lot Help needed moving to and from a bed to a chair (including a wheelchair)?: A Lot Help needed standing up from a chair using your arms (e.g., wheelchair or bedside chair)?: A Lot Help needed to walk  in hospital room?: A Little Help needed climbing 3-5 steps with a railing? : A Lot 6 Click Score: 13    End of Session Equipment Utilized During Treatment: Gait belt Activity Tolerance: Patient tolerated treatment well Patient left: in chair;with call bell/phone within reach;with chair alarm set Nurse Communication: Mobility status PT Visit Diagnosis: Other abnormalities of gait and mobility (R26.89);Unsteadiness on feet (R26.81)    Time: 2778-2423 PT Time Calculation (min) (ACUTE ONLY): 28 min   Charges:   PT Evaluation $PT Eval Moderate Complexity: 1 Mod PT Treatments $Gait Training: 8-22 mins        [place] [facility] [place] 11/24/2021, 10:54 AM

## 2021-11-24 NOTE — TOC Benefit Eligibility Note (Signed)
Patient Research scientist (life sciences) completed.     The patient is currently admitted and upon discharge could be taking Eliquis.   The current 30 day co-pay is, $0.   The patient is insured through [employer].

## 2021-11-24 NOTE — TOC Transition Note (Addendum)
Transition of Care [facility]) - CM/SW Discharge Note   Patient Details  Name: Sierra Krueger MRN: [id number] Date of Birth: 1956/03/07  [facility] CM/SW Contact:  Zenon Mayo, RN Phone Number: 11/24/2021, 1:52 PM   Clinical Narrative:    Patient is for dc today, she has no needs. She has PCP at [place], she has apt on Friday per patient.           Patient Goals and CMS Choice        Discharge Placement                       Discharge Plan and Services                                     Social Determinants of Health (SDOH) Interventions     Readmission Risk Interventions     No data to display

## 2021-11-24 NOTE — Progress Notes (Signed)
Sierra Krueger to be D/C'd home per MD order. Discussed with the patient and all questions fully answered.  Skin clean, dry and intact without evidence of skin break down, no evidence of skin tears noted.  IV catheter discontinued intact. Site without signs and symptoms of complications. Dressing and pressure applied.  An After Visit Summary was printed and given to the patient.  Patient escorted via [place], and D/C home via private auto.  Melonie [place]  11/24/2021 3:03 PM

## 2021-11-24 NOTE — Telephone Encounter (Signed)
Patient Research scientist (life sciences) completed.     The patient is currently admitted and upon discharge could be taking Eliquis.   The current 30 day co-pay is, $0.   The patient is insured through [employer].

## 2021-11-26 NOTE — Discharge Summary (Signed)
Physician Discharge Summary   Patient: Sierra Krueger MRN: [id number] DOB: 1955-09-15  Admit date:     11/21/2021  Discharge date: 11/24/2021  Discharge Physician: Berle Mull  PCP: Patient, No Pcp Per  Recommendations at discharge: Follow-up with PCP as recommended.  Discharge Diagnoses: Principal Problem:   Acute pulmonary embolism (HCC) Active Problems:   Essential hypertension   GERD without esophagitis   Pulmonary emboli [facility])  Hospital Course: Past medical history of HTN, CVA, GERD.  Presented to hospital with complaints of shortness of breath.  Found to have very small acute PE.  The patient is significantly symptomatic and hemodynamically unstable for the size of the PE that she has. At present I will continue to observe the patient continue with IV heparin and monitor and further work-up.so far no evidence of bacterial infection.  COVID-19 negative.  Echocardiogram shows preserved EF without any wall motion abnormality.  Assessment and Plan: Acute pulmonary embolism (HCC) CT angiogram reveals evidence of subsegmental pulmonary emboli of the right upper and right lower lobes. Initially this was thought to possibly be artifact however in the setting of substantial sinus tachycardia in the emergency department as well as significant dyspnea with exertion clinically these are felt to be pulmonary emboli. Currently on IV heparin switched to Eliquis. Lower extremity Doppler negative for DVT. Echocardiogram shows no RV dysfunction. Troponins are negative.  BNP negative. For now we will continue anticoagulation for 3 months and monitor outpatient.   Sinus tachycardia. Etiology could be PE although not clear. Infection work-up is negative. No evidence of dehydration or any acute intra-abdominal pathology. Normal TSH and free T4. Echocardiogram is reassuring. Increase Cardizem dose from 30 twice daily to 120 mg daily.   Essential hypertension Blood pressure stable.  Discontinue  Norvasc. Continue current regimen.   GERD without esophagitis Continuing home regimen of daily PPI therapy.  Consultants: none Procedures performed:  none DISCHARGE MEDICATION: Allergies as of 11/24/2021       Reactions   Codeine Hives   Morphine And Related Itching, Nausea And Vomiting   Penicillin G Other (See Comments)   Shellfish Allergy Hives, Swelling        Medication List     STOP taking these medications    amLODipine 5 MG tablet Commonly known as: NORVASC   aspirin 325 MG tablet   diltiazem 30 MG tablet Commonly known as: CARDIZEM       TAKE these medications    Cartia XT 120 MG 24 hr capsule Generic drug: diltiazem Take 1 capsule (120 mg total) by mouth daily.   cetirizine 10 MG tablet Commonly known as: ZYRTEC Take 10 mg by mouth daily as needed for allergies.   chlorproMAZINE 200 MG tablet Commonly known as: THORAZINE Take 200 mg by mouth at bedtime.   diclofenac Sodium 1 % Gel Commonly known as: VOLTAREN Apply 2 g topically daily as needed (for pain).   Eliquis DVT/PE Starter Pack Generic drug: Apixaban Starter Pack ('10mg'$  and '5mg'$ ) Take as directed on package: start with two-'5mg'$  tablets twice daily for 7 days. On day 8, switch to one-'5mg'$  tablet twice daily.   fluticasone 50 MCG/ACT nasal spray Commonly known as: FLONASE Place 1 spray into both nostrils daily as needed for allergies or rhinitis.   hydrOXYzine 10 MG tablet Commonly known as: ATARAX Take 10 mg by mouth 3 (three) times daily as needed for anxiety.   lamoTRIgine 150 MG tablet Commonly known as: LAMICTAL Take 300 mg by mouth daily.   losartan 25  MG tablet Commonly known as: COZAAR Take 25 mg by mouth daily.   mirtazapine 15 MG tablet Commonly known as: REMERON Take 15 mg by mouth at bedtime.   Oxycodone HCl 10 MG Tabs Take 10 mg by mouth 4 (four) times daily as needed (pain).   pantoprazole 20 MG tablet Commonly known as: PROTONIX Take 20 mg by mouth daily.    pregabalin 25 MG capsule Commonly known as: LYRICA Take 25 mg by mouth 3 (three) times daily as needed (neuropathy).   rosuvastatin 10 MG tablet Commonly known as: CRESTOR Take 10 mg by mouth daily.   tiZANidine 4 MG tablet Commonly known as: ZANAFLEX Take 4 mg by mouth every 6 (six) hours as needed for muscle spasms.        Follow-up Information     PCP. Schedule an appointment as soon as possible for a visit in 1 week(s).                 Disposition: Home Diet recommendation: Cardiac diet  Discharge Exam: Vitals:   11/23/21 2000 11/23/21 2024 11/24/21 0000 11/24/21 0400  BP: 105/84  127/82 128/83  Pulse: (!) 118 (!) 110 (!) 114 (!) 110  Resp: '18 18 16 19  '$ Temp: 98.4 F (36.9 C)  98.1 F (36.7 C) 97.9 F (36.6 C)  TempSrc: Oral  Oral Oral  SpO2: 99% 100% 99% 99%  Weight:    71.2 kg  Height:       General: Appear in no distress; no visible Abnormal Neck Mass Or lumps, Conjunctiva normal Cardiovascular: S1 and S2 Present, no Murmur, Respiratory: good respiratory effort, Bilateral Air entry present and CTA, no Crackles, no wheezes Abdomen: Bowel Sound present, Non tender  Extremities: no Pedal edema Neurology: alert and oriented to time, place, and person  Gait not checked due to patient safety concerns Filed Weights   11/22/21 1339 11/23/21 0010 11/24/21 0400  Weight: 73.1 kg 72.8 kg 71.2 kg   Condition at discharge: stable  The results of significant diagnostics from this hospitalization (including imaging, microbiology, ancillary and laboratory) are listed below for reference.   Imaging Studies: VAS [place] LOWER EXTREMITY VENOUS (DVT)  Result Date: 11/24/2021  Lower Venous DVT Study Patient Name:  DONNAE MICHELS  Date of Exam:   11/22/2021 Medical Rec #: [id number]    Accession #:    [id number] Date of Birth: 1955/06/14     Patient Gender: F Patient Age:   49 years Exam Location:  [facility] Procedure:      VAS [place] LOWER EXTREMITY VENOUS (DVT) Referring  Phys: Inda Merlin --------------------------------------------------------------------------------  Indications: Pulmonary embolism.  Comparison Study: No prior study Performing Technologist: Sharion Dove RVS  Examination Guidelines: A complete evaluation includes B-mode imaging, spectral Doppler, color Doppler, and power Doppler as needed of all accessible portions of each vessel. Bilateral testing is considered an integral part of a complete examination. Limited examinations for reoccurring indications may be performed as noted. The reflux portion of the exam is performed with the patient in reverse Trendelenburg.  +---------+---------------+---------+-----------+----------+--------------+ RIGHT    CompressibilityPhasicitySpontaneityPropertiesThrombus Aging +---------+---------------+---------+-----------+----------+--------------+ CFV      Full           Yes      Yes                                 +---------+---------------+---------+-----------+----------+--------------+ SFJ      Full                                                        +---------+---------------+---------+-----------+----------+--------------+  FV Prox  Full                                                        +---------+---------------+---------+-----------+----------+--------------+ FV Mid   Full                                                        +---------+---------------+---------+-----------+----------+--------------+ FV DistalFull                                                        +---------+---------------+---------+-----------+----------+--------------+ PFV      Full                                                        +---------+---------------+---------+-----------+----------+--------------+ POP      Full           Yes      Yes                                 +---------+---------------+---------+-----------+----------+--------------+ PTV      Full                                                         +---------+---------------+---------+-----------+----------+--------------+ PERO     Full                                                        +---------+---------------+---------+-----------+----------+--------------+   +---------+---------------+---------+-----------+----------+--------------+ LEFT     CompressibilityPhasicitySpontaneityPropertiesThrombus Aging +---------+---------------+---------+-----------+----------+--------------+ CFV      Full           Yes      Yes                                 +---------+---------------+---------+-----------+----------+--------------+ SFJ      Full                                                        +---------+---------------+---------+-----------+----------+--------------+ FV Prox  Full                                                        +---------+---------------+---------+-----------+----------+--------------+  FV Mid   Full                                                        +---------+---------------+---------+-----------+----------+--------------+ FV DistalFull                                                        +---------+---------------+---------+-----------+----------+--------------+ PFV      Full                                                        +---------+---------------+---------+-----------+----------+--------------+ POP      Full           Yes      Yes                                 +---------+---------------+---------+-----------+----------+--------------+ PTV      Full                                                        +---------+---------------+---------+-----------+----------+--------------+ PERO     Full                                                        +---------+---------------+---------+-----------+----------+--------------+     Summary: BILATERAL: - No evidence of deep vein thrombosis seen in the  lower extremities, bilaterally. -No evidence of popliteal cyst, bilaterally.   *See table(s) above for measurements and observations. Electronically signed by Orlie Pollen on 11/24/2021 at 12:43:24 PM.    Final    ECHOCARDIOGRAM COMPLETE  Result Date: 11/23/2021    ECHOCARDIOGRAM REPORT   Patient Name:   YLONDA STORR Date of Exam: 11/23/2021 Medical Rec #:  [id number]   Height:       65.0 in Accession #:    [id number]  Weight:       160.5 lb Date of Birth:  July 03, 1955    BSA:          1.802 m Patient Age:    85 years    BP:           136/84 mmHg Patient Gender: F           HR:           115 bpm. Exam Location:  Inpatient Procedure: 2D Echo, Cardiac Doppler and Color Doppler Indications:    Pulmonary Embolus  History:        Patient has no prior history of Echocardiogram examinations.  Sonographer:    Ronny Flurry Sonographer#2:  Danne Baxter RDCS, FE, PE Referring Phys: [id number] [place]  1. Left ventricular  ejection fraction, by estimation, is 65 to 70%. The left ventricle has normal function. The left ventricle has no regional wall motion abnormalities. There is mild asymmetric left ventricular hypertrophy of the basal-septal segment. Left ventricular diastolic parameters are consistent with Grade I diastolic dysfunction (impaired relaxation).  2. Right ventricular systolic function is normal. The right ventricular size is normal. Tricuspid regurgitation signal is inadequate for assessing PA pressure.  3. The mitral valve is normal in structure. Trivial mitral valve regurgitation.  4. The aortic valve is tricuspid. Aortic valve regurgitation is not visualized. No aortic stenosis is present.  5. The inferior vena cava is normal in size with greater than 50% respiratory variability, suggesting right atrial pressure of 3 mmHg. Comparison(s): No prior Echocardiogram. FINDINGS  Left Ventricle: Left ventricular ejection fraction, by estimation, is 65 to 70%. The left ventricle has normal  function. The left ventricle has no regional wall motion abnormalities. The left ventricular internal cavity size was normal in size. There is  mild asymmetric left ventricular hypertrophy of the basal-septal segment. Left ventricular diastolic parameters are consistent with Grade I diastolic dysfunction (impaired relaxation). Right Ventricle: The right ventricular size is normal. No increase in right ventricular wall thickness. Right ventricular systolic function is normal. Tricuspid regurgitation signal is inadequate for assessing PA pressure. Left Atrium: Left atrial size was normal in size. Right Atrium: Right atrial size was normal in size. Pericardium: There is no evidence of pericardial effusion. Mitral Valve: The mitral valve is normal in structure. Trivial mitral valve regurgitation. Tricuspid Valve: The tricuspid valve is normal in structure. Tricuspid valve regurgitation is trivial. Aortic Valve: The aortic valve is tricuspid. Aortic valve regurgitation is not visualized. No aortic stenosis is present. Pulmonic Valve: The pulmonic valve was normal in structure. Pulmonic valve regurgitation is trivial. Aorta: The aortic root and ascending aorta are structurally normal, with no evidence of dilitation. Venous: The inferior vena cava is normal in size with greater than 50% respiratory variability, suggesting right atrial pressure of 3 mmHg. IAS/Shunts: The atrial septum is grossly normal.  LEFT VENTRICLE PLAX 2D LVIDd:         3.60 cm   Diastology LVIDs:         2.40 cm   LV e' medial:  12.40 cm/s LV PW:         1.00 cm   LV e' lateral: 7.29 cm/s LV IVS:        1.10 cm LVOT diam:     1.90 cm LV SV:         50 LV SV Index:   28 LVOT Area:     2.84 cm  RIGHT VENTRICLE TAPSE (M-mode): 1.4 cm LEFT ATRIUM             Index        RIGHT ATRIUM           Index LA diam:        3.20 cm 1.78 cm/m   RA Area:     11.70 cm LA Vol (A2C):   33.7 ml 18.71 ml/m  RA Volume:   26.80 ml  14.88 ml/m LA Vol (A4C):   24.6 ml  13.66 ml/m LA Biplane Vol: 29.7 ml 16.49 ml/m  AORTIC VALVE LVOT Vmax:   103.00 cm/s LVOT Vmean:  73.800 cm/s LVOT VTI:    0.175 m  AORTA Ao Root diam: 2.90 cm Ao Asc diam:  2.60 cm  SHUNTS Systemic VTI:  0.18 m Systemic Diam: 1.90 cm Heather  Johney Frame MD Electronically signed by Gwyndolyn Kaufman MD Signature Date/Time: 11/23/2021/4:32:19 PM    Final    CT Angio Chest PE W and/or Wo Contrast  Result Date: 11/21/2021 CLINICAL DATA:  Positive D-dimer.  Concern for pulmonary embolism. EXAM: CT ANGIOGRAPHY CHEST WITH CONTRAST TECHNIQUE: Multidetector CT imaging of the chest was performed using the standard protocol during bolus administration of intravenous contrast. Multiplanar CT image reconstructions and MIPs were obtained to evaluate the vascular anatomy. RADIATION DOSE REDUCTION: This exam was performed according to the departmental dose-optimization program which includes automated exposure control, adjustment of the mA and/or kV according to patient size and/or use of iterative reconstruction technique. CONTRAST:  91m OMNIPAQUE IOHEXOL 350 MG/ML SOLN COMPARISON:  Chest radiograph dated 11/21/2021. FINDINGS: Cardiovascular: There is no cardiomegaly or pericardial effusion. The thoracic aorta is unremarkable. The origins of the great vessels of the aortic arch appear patent. Apparent faint intraluminal density in the subsegmental right upper lobe pulmonary artery branch (168/7 and coronal 55/8) may be artifactual or related to mixing of the contrast. A small age indeterminate pulmonary embolism is not entirely excluded. Additional small focus of density along the periphery of the right lower lobe pulmonary artery branch (181/7) may be extravascular or represent an age indeterminate, possibly subacute or old PE or scarring. Clinical correlation is recommended. No large or central pulmonary artery embolus identified. Mediastinum/Nodes: Mildly enlarged right hilar lymph nodes measure up to 13 mm. No mediastinal  adenopathy. There is a moderate size hiatal hernia. The esophagus is grossly unremarkable. Multiple bilateral thyroid nodules measure up to 1.6 cm on the right. This has been evaluated on previous imaging. (ref: J Am Coll Radiol. 2015 Feb;12(2): 143-50).No mediastinal fluid collection. Lungs/Pleura: No focal consolidation, pleural effusion, or pneumothorax. There is mild eventration of the right hemidiaphragm with mild right lung base atelectasis. The central airways are patent. Upper Abdomen: No acute abnormality. Musculoskeletal: No chest wall abnormality. No acute or significant osseous findings. Review of the MIP images confirms the above findings. IMPRESSION: 1. No CT evidence of central pulmonary artery embolus. Artifact versus possible small age indeterminate subsegmental emboli in the right upper and right lower lobes. 2. Mildly enlarged right hilar lymph nodes, nonspecific. 3. Moderate size hiatal hernia. Electronically Signed   By: AAnner CreteM.D.   On: 11/21/2021 23:59   DG Chest Portable 1 View  Result Date: 11/21/2021 CLINICAL DATA:  Shortness of breath EXAM: PORTABLE CHEST 1 VIEW COMPARISON:  None Available. FINDINGS: Cardiac shadows within normal limits. Lungs are well aerated bilaterally. Minimal basilar atelectasis is seen. No sizable effusion is noted. No bony abnormality is noted. IMPRESSION: Mild bibasilar atelectasis. Electronically Signed   By: MInez CatalinaM.D.   On: 11/21/2021 21:59    Microbiology: Results for orders placed or performed during the hospital encounter of 06/19/19  SARS CORONAVIRUS 2 (TAT 6-24 HRS) Nasopharyngeal Nasopharyngeal Swab     Status: None   Collection Time: 06/19/19 10:13 AM   Specimen: Nasopharyngeal Swab  Result Value Ref Range Status   SARS Coronavirus 2 NEGATIVE NEGATIVE Final    Comment: (NOTE) SARS-CoV-2 target nucleic acids are NOT DETECTED. The SARS-CoV-2 RNA is generally detectable in upper and lower respiratory specimens during the  acute phase of infection. Negative results do not preclude SARS-CoV-2 infection, do not rule out co-infections with other pathogens, and should not be used as the sole basis for treatment or other patient management decisions. Negative results must be combined with clinical observations, patient history, and epidemiological information.  The expected result is Negative. Fact Sheet for Patients: SugarRoll.be Fact Sheet for Healthcare Providers: https://www.woods-mathews.com/ This test is not yet approved or cleared by the [place] FDA and  has been authorized for detection and/or diagnosis of SARS-CoV-2 by FDA under an Emergency Use Authorization (EUA). This EUA will remain  in effect (meaning this test can be used) for the duration of the COVID-19 declaration under Section 56 4(b)(1) of the Act, 21 U.S.C. section 360bbb-3(b)(1), unless the authorization is terminated or revoked sooner. Performed at [facility], [place] [address], [place], [place] [address]     Labs: CBC: Recent Labs  Lab 11/21/21 2042 11/22/21 0800 11/23/21 0656 11/24/21 0311  WBC 6.0 7.0 8.5 7.5  NEUTROABS 4.1 3.9  --   --   HGB 11.1* 11.0* 11.3* 11.4*  HCT 34.0* 35.1* 34.5* 34.7*  MCV 89.0 91.4 86.9 86.5  PLT 266 279 276 952   Basic Metabolic Panel: Recent Labs  Lab 11/21/21 2042 11/22/21 0800  NA 136 140  K 4.3 4.0  CL 104 111  CO2 25 23  GLUCOSE 108* 98  BUN 11 7*  CREATININE 1.24* 1.07*  CALCIUM 9.1 8.7*  MG  --  1.7   Liver Function Tests: Recent Labs  Lab 11/21/21 2042 11/22/21 0800  AST 14* 13*  ALT 7 9  ALKPHOS 61 57  BILITOT 0.3 0.4  PROT 6.7 6.3*  ALBUMIN 3.4* 3.2*   CBG: No results for input(s): "GLUCAP" in the last 168 hours.  Discharge time spent: greater than 30 minutes.  Signed: Berle Mull, MD [facility] 11/24/2021

## 2022-12-14 ENCOUNTER — Emergency Department (HOSPITAL_COMMUNITY): Payer: 59

## 2022-12-14 ENCOUNTER — Encounter (HOSPITAL_COMMUNITY): Payer: Self-pay

## 2022-12-14 ENCOUNTER — Emergency Department (HOSPITAL_COMMUNITY)
Admission: EM | Admit: 2022-12-14 | Discharge: 2022-12-14 | Disposition: A | Discharge status: Home / Self Care | Payer: 59 | Attending: Emergency Medicine | Admitting: Emergency Medicine

## 2022-12-14 ENCOUNTER — Other Ambulatory Visit: Payer: Self-pay

## 2022-12-14 DIAGNOSIS — Y92002 Bathroom of unspecified non-institutional (private) residence single-family (private) house as the place of occurrence of the external cause: Secondary | ICD-10-CM | POA: Diagnosis not present

## 2022-12-14 DIAGNOSIS — M25572 Pain in left ankle and joints of left foot: Secondary | ICD-10-CM | POA: Diagnosis present

## 2022-12-14 DIAGNOSIS — Z7901 Long term (current) use of anticoagulants: Secondary | ICD-10-CM | POA: Insufficient documentation

## 2022-12-14 DIAGNOSIS — N189 Chronic kidney disease, unspecified: Secondary | ICD-10-CM | POA: Insufficient documentation

## 2022-12-14 DIAGNOSIS — I129 Hypertensive chronic kidney disease with stage 1 through stage 4 chronic kidney disease, or unspecified chronic kidney disease: Secondary | ICD-10-CM | POA: Insufficient documentation

## 2022-12-14 DIAGNOSIS — X501XXA Overexertion from prolonged static or awkward postures, initial encounter: Secondary | ICD-10-CM | POA: Diagnosis not present

## 2022-12-14 DIAGNOSIS — Z8673 Personal history of transient ischemic attack (TIA), and cerebral infarction without residual deficits: Secondary | ICD-10-CM | POA: Diagnosis not present

## 2022-12-14 DIAGNOSIS — Y9301 Activity, walking, marching and hiking: Secondary | ICD-10-CM | POA: Insufficient documentation

## 2022-12-14 DIAGNOSIS — S92302A Fracture of unspecified metatarsal bone(s), left foot, initial encounter for closed fracture: Secondary | ICD-10-CM | POA: Insufficient documentation

## 2022-12-14 DIAGNOSIS — Z79899 Other long term (current) drug therapy: Secondary | ICD-10-CM | POA: Diagnosis not present

## 2022-12-14 MED ORDER — HYDROCODONE-ACETAMINOPHEN 5-325 MG PO TABS
1.0000 | ORAL_TABLET | Freq: Once | ORAL | Status: AC
  Administered 2022-12-14: 1 via ORAL
  Filled 2022-12-14: qty 1

## 2022-12-14 NOTE — ED Triage Notes (Signed)
Pt arrives via EMS from home. Pt twisted her left ankle last night. Reports pain and swelling has not improved. Pt AxOx4. Denies any other injury or complaint.

## 2022-12-14 NOTE — ED Notes (Signed)
Ortho tech paged  

## 2022-12-14 NOTE — ED Provider Notes (Signed)
EMERGENCY DEPARTMENT AT [facility] Provider Note   CSN: [id number] Arrival date & time: 12/14/22  1157     History  Chief Complaint  Patient presents with   Ankle Pain   HPI Sierra Krueger is a 67 y.o. female with hypertension, CKD, history of CVA with left-sided weakness presenting for foot pain.  States she was walking from her bedroom to her bathroom when she tripped and twisted her left foot.  Denies hitting her head or losing consciousness.  States that she is notices pain in the top of her midfoot along with some swelling.  Denies any numbness or weakness in the foot.  Has had difficulty bearing weight on the foot since the injury yesterday.  Usually uses a walker for ambulation.   Ankle Pain      Home Medications Prior to Admission medications   Medication Sig Start Date End Date Taking? Authorizing Provider  APIXABAN (ELIQUIS) VTE STARTER PACK (10MG  AND 5MG ) Take as directed on package: start with two-5mg  tablets twice daily for 7 days. On day 8, switch to one-5mg  tablet twice daily. 11/24/21   Rolly Salter, MD  cetirizine (ZYRTEC) 10 MG tablet Take 10 mg by mouth daily as needed for allergies.    [provider]  chlorproMAZINE (THORAZINE) 200 MG tablet Take 200 mg by mouth at bedtime. 10/09/21   [provider]  diclofenac Sodium (VOLTAREN) 1 % GEL Apply 2 g topically daily as needed (for pain). 09/29/21   [provider]  diltiazem (CARDIZEM CD) 120 MG 24 hr capsule Take 1 capsule (120 mg total) by mouth daily. 11/25/21   Rolly Salter, MD  fluticasone Aleda Grana) 50 MCG/ACT nasal spray Place 1 spray into both nostrils daily as needed for allergies or rhinitis.    [provider]  hydrOXYzine (ATARAX) 10 MG tablet Take 10 mg by mouth 3 (three) times daily as needed for anxiety. 10/07/21   [provider]  lamoTRIgine (LAMICTAL) 150 MG tablet Take 300 mg by mouth daily. 10/07/21   [provider]  losartan  (COZAAR) 25 MG tablet Take 25 mg by mouth daily. 10/29/21   [provider]  mirtazapine (REMERON) 15 MG tablet Take 15 mg by mouth at bedtime. 10/08/19   [provider]  Oxycodone HCl 10 MG TABS Take 10 mg by mouth 4 (four) times daily as needed (pain).    [provider]  pantoprazole (PROTONIX) 20 MG tablet Take 20 mg by mouth daily. 10/22/21   [provider]  pregabalin (LYRICA) 25 MG capsule Take 25 mg by mouth 3 (three) times daily as needed (neuropathy).  05/21/19   [provider]  rosuvastatin (CRESTOR) 10 MG tablet Take 10 mg by mouth daily. 10/22/21   [provider]  tiZANidine (ZANAFLEX) 4 MG tablet Take 4 mg by mouth every 6 (six) hours as needed for muscle spasms. 11/14/19   [provider]      Allergies    Codeine, Morphine and codeine, Penicillin g, and Shellfish allergy    Review of Systems   See HPI for pertinent positives  Physical Exam Updated Vital Signs BP (!) 120/97 (BP Location: Left Arm)   Pulse 94   Temp 98.9 F (37.2 C) (Oral)   Resp 19   Ht 5\' 5"  (1.651 m)   Wt 72.6 kg   SpO2 95%   BMI 26.63 kg/m  Physical Exam Constitutional:      Appearance: Normal appearance.  HENT:  Head: Normocephalic.     Nose: Nose normal.  Eyes:     Conjunctiva/sclera: Conjunctivae normal.  Pulmonary:     Effort: Pulmonary effort is normal.  Musculoskeletal:     Right ankle: Normal.     Left ankle: Normal. No swelling or deformity. No tenderness. Normal range of motion.     Left foot: Decreased range of motion. Swelling and tenderness present.       Legs:     Comments: Pedal pulses 2+. Sensation intact. Cap refill is brisk distally.  Neurological:     Mental Status: She is alert.  Psychiatric:        Mood and Affect: Mood normal.     ED Results / Procedures / Treatments   Labs (all labs ordered are listed, but only abnormal results are displayed) Labs Reviewed - No data to  display  EKG None  Radiology CT Foot Left Wo Contrast  Result Date: 12/14/2022 CLINICAL DATA:  Left foot and ankle twisting injury last night. EXAM: CT OF THE LEFT FOOT WITHOUT CONTRAST TECHNIQUE: Multidetector CT imaging of the left foot was performed according to the standard protocol. Multiplanar CT image reconstructions were also generated. RADIATION DOSE REDUCTION: This exam was performed according to the departmental dose-optimization program which includes automated exposure control, adjustment of the mA and/or kV according to patient size and/or use of iterative reconstruction technique. COMPARISON:  Left foot and ankle x-rays from same day. FINDINGS: Bones/Joint/Cartilage Acute nondisplaced fracture of the proximal second metatarsal without intra-articular extension. Acute minimally displaced fractures of the proximal third and fourth metatarsals without intra-articular extension. No fracture or dislocation. Chronically fused second through fourth PIP joints. Remaining joint spaces are preserved. No joint effusion. Ligaments Ligaments are suboptimally evaluated by CT. Muscles and Tendons Grossly intact. Soft tissue Dorsal foot and lateral ankle soft tissue swelling. No fluid collection or hematoma. No soft tissue mass. IMPRESSION: 1. Acute fractures of the proximal second through fourth metatarsals without evidence of Lisfranc injury. Electronically Signed   By: Obie Dredge M.D.   On: 12/14/2022 18:24   DG Ankle Complete Left  Result Date: 12/14/2022 CLINICAL DATA:  Ankle injury.  Ankle and dorsal foot pain. EXAM: LEFT ANKLE COMPLETE - 3+ VIEW; LEFT FOOT - COMPLETE 3+ VIEW COMPARISON:  Left foot radiographs 05/29/2019 FINDINGS: Left ankle: There is diffuse decreased bone mineralization. Mild-to-moderate distal medial malleolar degenerative spurring. The ankle mortise is symmetric and intact. Tiny plantar calcaneal heel spur. Mild talonavicular joint space narrowing. Mild dorsal  navicular-cuneiform degenerative osteophytosis. Left foot: Mild lateral great toe metatarsophalangeal joint space narrowing and peripheral osteophytosis, mildly increased from prior. There is chronic fusion of the second through fourth PIP joints, unchanged. There is transverse linear lucency within the proximal metaphyses of the second through fourth metatarsals with mild (up to 2 mm) transverse cortical step-off suspicious for acute mildly impacted fractures. There is moderate dorsal forefoot soft tissue swelling at the level of the metatarsals on lateral view, new from prior. IMPRESSION: 1. Acute mildly impacted fractures of the proximal metaphyses of the second through fourth metatarsals. 2. Moderate dorsal forefoot soft tissue swelling. 3. Mild-to-moderate medial malleolar degenerative spurring. 4. 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 Electronically Signed   By: Neita Garnet M.D.   On: 12/14/2022 13:34   DG Foot Complete Left  Result Date: 12/14/2022 CLINICAL DATA:  Ankle injury.  Ankle and dorsal foot pain. EXAM: LEFT ANKLE COMPLETE - 3+ VIEW; LEFT FOOT - COMPLETE 3+ VIEW COMPARISON:  Left foot radiographs  05/29/2019 FINDINGS: Left ankle: There is diffuse decreased bone mineralization. Mild-to-moderate distal medial malleolar degenerative spurring. The ankle mortise is symmetric and intact. Tiny plantar calcaneal heel spur. Mild talonavicular joint space narrowing. Mild dorsal navicular-cuneiform degenerative osteophytosis. Left foot: Mild lateral great toe metatarsophalangeal joint space narrowing and peripheral osteophytosis, mildly increased from prior. There is chronic fusion of the second through fourth PIP joints, unchanged. There is transverse linear lucency within the proximal metaphyses of the second through fourth metatarsals with mild (up to 2 mm) transverse cortical step-off suspicious for acute mildly impacted fractures. There is moderate dorsal forefoot  soft tissue swelling at the level of the metatarsals on lateral view, new from prior. IMPRESSION: 1. Acute mildly impacted fractures of the proximal metaphyses of the second through fourth metatarsals. 2. Moderate dorsal forefoot soft tissue swelling. 3. Mild-to-moderate medial malleolar degenerative spurring. 4. 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 Electronically Signed   By: Neita Garnet M.D.   On: 12/14/2022 13:34    Procedures Procedures    Medications Ordered in ED Medications  HYDROcodone-acetaminophen (NORCO/VICODIN) 5-325 MG per tablet 1 tablet (1 tablet Oral Given 12/14/22 1559)    ED Course/ Medical Decision Making/ A&P Clinical Course as of 12/14/22 1834  Tue Dec 14, 2022  1554 Ct for lisfranc, if positive consult ortho surg. Treat with cam boot [JR]    Clinical Course User Index [JR] Gareth Eagle, PA-C                                 Medical Decision Making Amount and/or Complexity of Data Reviewed Radiology: ordered.  Risk Prescription drug management.   67 year old well-appearing female presenting for foot pain after fall yesterday evening.  Exam notable for swelling of the left foot along with tenderness of the midfoot.  X-ray revealed  acute mildly impacted fractures of the proximal metaphyses of the second through fourth metatarsals.  Discussed patient with PA Dale Hopkins who advised CT to rule out Lisfranc.  CT was negative for Lisfranc.  Patient placed in cam boot and advised [facility] per his work recommendations and follow-up with orthopedics outpatient.  Vitals stable.  Discharged home with condition.        Final Clinical Impression(s) / ED Diagnoses Final diagnoses:  Multiple closed fractures of metatarsal bone of left foot, initial encounter    Rx / DC Orders ED Discharge Orders     None         Gareth Eagle, PA-C 12/14/22 Nida Boatman    Ernie Avena, MD 12/15/22 [phone]

## 2022-12-14 NOTE — Discharge Instructions (Addendum)
Evaluation today revealed that you have multiple fractures in your left midfoot likely had a traumatic injury related to your recent fall.  Advised nonweightbearing and follow-up with orthopedics.

## 2022-12-14 NOTE — Progress Notes (Signed)
Orthopedic Tech Progress Note Patient Details:  Sierra Krueger 1955/05/26 [id number]  Ortho Devices Type of Ortho Device: CAM walker Ortho Device/Splint Location: LLE Ortho Device/Splint Interventions: Ordered, Application   Post Interventions Patient Tolerated: Well Instructions Provided: Adjustment of device, Care of device  [name] A [name] 12/14/2022, 7:09 PM

## 2023-04-28 ENCOUNTER — Encounter (HOSPITAL_COMMUNITY): Payer: Self-pay | Admitting: Internal Medicine

## 2023-04-28 ENCOUNTER — Emergency Department (HOSPITAL_COMMUNITY): Payer: 59

## 2023-04-28 ENCOUNTER — Inpatient Hospital Stay (HOSPITAL_COMMUNITY)
Admission: EM | Admit: 2023-04-28 | Discharge: 2023-05-03 | DRG: 917 | Disposition: A | Discharge status: Home / Self Care | Payer: 59 | Attending: Internal Medicine | Admitting: Internal Medicine

## 2023-04-28 ENCOUNTER — Other Ambulatory Visit: Payer: Self-pay

## 2023-04-28 DIAGNOSIS — R7303 Prediabetes: Secondary | ICD-10-CM | POA: Diagnosis present

## 2023-04-28 DIAGNOSIS — R400 Somnolence: Secondary | ICD-10-CM

## 2023-04-28 DIAGNOSIS — T402X1A Poisoning by other opioids, accidental (unintentional), initial encounter: Principal | ICD-10-CM | POA: Diagnosis present

## 2023-04-28 DIAGNOSIS — I69354 Hemiplegia and hemiparesis following cerebral infarction affecting left non-dominant side: Secondary | ICD-10-CM

## 2023-04-28 DIAGNOSIS — T50905A Adverse effect of unspecified drugs, medicaments and biological substances, initial encounter: Secondary | ICD-10-CM

## 2023-04-28 DIAGNOSIS — F1120 Opioid dependence, uncomplicated: Secondary | ICD-10-CM | POA: Diagnosis present

## 2023-04-28 DIAGNOSIS — F419 Anxiety disorder, unspecified: Secondary | ICD-10-CM | POA: Diagnosis present

## 2023-04-28 DIAGNOSIS — I129 Hypertensive chronic kidney disease with stage 1 through stage 4 chronic kidney disease, or unspecified chronic kidney disease: Secondary | ICD-10-CM | POA: Diagnosis present

## 2023-04-28 DIAGNOSIS — K2950 Unspecified chronic gastritis without bleeding: Secondary | ICD-10-CM | POA: Diagnosis present

## 2023-04-28 DIAGNOSIS — Z885 Allergy status to narcotic agent status: Secondary | ICD-10-CM

## 2023-04-28 DIAGNOSIS — Z79899 Other long term (current) drug therapy: Secondary | ICD-10-CM

## 2023-04-28 DIAGNOSIS — R4701 Aphasia: Secondary | ICD-10-CM | POA: Diagnosis present

## 2023-04-28 DIAGNOSIS — K449 Diaphragmatic hernia without obstruction or gangrene: Secondary | ICD-10-CM

## 2023-04-28 DIAGNOSIS — Z87891 Personal history of nicotine dependence: Secondary | ICD-10-CM

## 2023-04-28 DIAGNOSIS — E041 Nontoxic single thyroid nodule: Secondary | ICD-10-CM | POA: Diagnosis present

## 2023-04-28 DIAGNOSIS — D509 Iron deficiency anemia, unspecified: Secondary | ICD-10-CM | POA: Diagnosis present

## 2023-04-28 DIAGNOSIS — Z91013 Allergy to seafood: Secondary | ICD-10-CM

## 2023-04-28 DIAGNOSIS — G894 Chronic pain syndrome: Secondary | ICD-10-CM | POA: Diagnosis present

## 2023-04-28 DIAGNOSIS — E876 Hypokalemia: Secondary | ICD-10-CM | POA: Diagnosis present

## 2023-04-28 DIAGNOSIS — M549 Dorsalgia, unspecified: Secondary | ICD-10-CM | POA: Diagnosis present

## 2023-04-28 DIAGNOSIS — J9601 Acute respiratory failure with hypoxia: Secondary | ICD-10-CM | POA: Diagnosis present

## 2023-04-28 DIAGNOSIS — G928 Other toxic encephalopathy: Secondary | ICD-10-CM | POA: Diagnosis present

## 2023-04-28 DIAGNOSIS — K219 Gastro-esophageal reflux disease without esophagitis: Secondary | ICD-10-CM | POA: Diagnosis present

## 2023-04-28 DIAGNOSIS — G90522 Complex regional pain syndrome I of left lower limb: Secondary | ICD-10-CM | POA: Diagnosis present

## 2023-04-28 DIAGNOSIS — Z7901 Long term (current) use of anticoagulants: Secondary | ICD-10-CM

## 2023-04-28 DIAGNOSIS — Z880 Allergy status to penicillin: Secondary | ICD-10-CM

## 2023-04-28 DIAGNOSIS — I10 Essential (primary) hypertension: Secondary | ICD-10-CM | POA: Diagnosis present

## 2023-04-28 DIAGNOSIS — G9340 Encephalopathy, unspecified: Secondary | ICD-10-CM | POA: Diagnosis present

## 2023-04-28 DIAGNOSIS — T48201A Poisoning by unspecified drugs acting on muscles, accidental (unintentional), initial encounter: Secondary | ICD-10-CM | POA: Diagnosis present

## 2023-04-28 DIAGNOSIS — D649 Anemia, unspecified: Principal | ICD-10-CM | POA: Diagnosis present

## 2023-04-28 DIAGNOSIS — N1831 Chronic kidney disease, stage 3a: Secondary | ICD-10-CM | POA: Diagnosis present

## 2023-04-28 DIAGNOSIS — J9691 Respiratory failure, unspecified with hypoxia: Secondary | ICD-10-CM | POA: Diagnosis present

## 2023-04-28 LAB — FERRITIN: Ferritin: 11 ng/mL (ref 11–307)

## 2023-04-28 LAB — COMPREHENSIVE METABOLIC PANEL
ALT: 8 U/L (ref 0–44)
AST: 14 U/L — ABNORMAL LOW (ref 15–41)
Albumin: 2.7 g/dL — ABNORMAL LOW (ref 3.5–5.0)
Alkaline Phosphatase: 52 U/L (ref 38–126)
Anion gap: 10 (ref 5–15)
BUN: 13 mg/dL (ref 8–23)
CO2: 21 mmol/L — ABNORMAL LOW (ref 22–32)
Calcium: 8.1 mg/dL — ABNORMAL LOW (ref 8.9–10.3)
Chloride: 107 mmol/L (ref 98–111)
Creatinine, Ser: 1.18 mg/dL — ABNORMAL HIGH (ref 0.44–1.00)
GFR, Estimated: 51 mL/min — ABNORMAL LOW (ref >60)
Glucose, Bld: 146 mg/dL — ABNORMAL HIGH (ref 70–99)
Potassium: 3.3 mmol/L — ABNORMAL LOW (ref 3.5–5.1)
Sodium: 138 mmol/L (ref 135–145)
Total Bilirubin: 0.4 mg/dL (ref 0.0–1.2)
Total Protein: 5.7 g/dL — ABNORMAL LOW (ref 6.5–8.1)

## 2023-04-28 LAB — ABO/RH: ABO/RH(D): A POS

## 2023-04-28 LAB — DIFFERENTIAL
Abs Immature Granulocytes: 0.05 10*3/uL (ref 0.00–0.07)
Basophils Absolute: 0 10*3/uL (ref 0.0–0.1)
Basophils Relative: 0 %
Eosinophils Absolute: 0.1 10*3/uL (ref 0.0–0.5)
Eosinophils Relative: 1 %
Immature Granulocytes: 1 %
Lymphocytes Relative: 12 %
Lymphs Abs: 1.3 10*3/uL (ref 0.7–4.0)
Monocytes Absolute: 1.1 10*3/uL — ABNORMAL HIGH (ref 0.1–1.0)
Monocytes Relative: 11 %
Neutro Abs: 7.7 10*3/uL (ref 1.7–7.7)
Neutrophils Relative %: 75 %

## 2023-04-28 LAB — CBC
HCT: 20.1 % — ABNORMAL LOW (ref 36.0–46.0)
Hemoglobin: 5.7 g/dL — CL (ref 12.0–15.0)
MCH: 20.1 pg — ABNORMAL LOW (ref 26.0–34.0)
MCHC: 28.4 g/dL — ABNORMAL LOW (ref 30.0–36.0)
MCV: 70.8 fL — ABNORMAL LOW (ref 80.0–100.0)
Platelets: 242 10*3/uL (ref 150–400)
RBC: 2.84 MIL/uL — ABNORMAL LOW (ref 3.87–5.11)
RDW: 19.4 % — ABNORMAL HIGH (ref 11.5–15.5)
WBC: 10.3 10*3/uL (ref 4.0–10.5)
nRBC: 0 % (ref 0.0–0.2)

## 2023-04-28 LAB — POCT I-STAT, CHEM 8
BUN: 13 mg/dL (ref 8–23)
Calcium, Ion: 1.07 mmol/L — ABNORMAL LOW (ref 1.15–1.40)
Chloride: 105 mmol/L (ref 98–111)
Creatinine, Ser: 1.2 mg/dL — ABNORMAL HIGH (ref 0.44–1.00)
Glucose, Bld: 146 mg/dL — ABNORMAL HIGH (ref 70–99)
HCT: 19 % — ABNORMAL LOW (ref 36.0–46.0)
Hemoglobin: 6.5 g/dL — CL (ref 12.0–15.0)
Potassium: 3.3 mmol/L — ABNORMAL LOW (ref 3.5–5.1)
Sodium: 138 mmol/L (ref 135–145)
TCO2: 21 mmol/L — ABNORMAL LOW (ref 22–32)

## 2023-04-28 LAB — PROTIME-INR
INR: 1.3 — ABNORMAL HIGH (ref 0.8–1.2)
Prothrombin Time: 16.4 s — ABNORMAL HIGH (ref 11.4–15.2)

## 2023-04-28 LAB — URINALYSIS, W/ REFLEX TO CULTURE (INFECTION SUSPECTED)
Bacteria, UA: NONE SEEN
Bilirubin Urine: NEGATIVE
Glucose, UA: NEGATIVE mg/dL
Hgb urine dipstick: NEGATIVE
Ketones, ur: NEGATIVE mg/dL
Nitrite: NEGATIVE
Protein, ur: NEGATIVE mg/dL
Specific Gravity, Urine: 1.021 (ref 1.005–1.030)
pH: 5 (ref 5.0–8.0)

## 2023-04-28 LAB — RESP PANEL BY RT-PCR (RSV, FLU A&B, COVID)  RVPGX2
Influenza A by PCR: NEGATIVE
Influenza B by PCR: NEGATIVE
Resp Syncytial Virus by PCR: NEGATIVE
SARS Coronavirus 2 by RT PCR: NEGATIVE

## 2023-04-28 LAB — AMMONIA: Ammonia: 34 umol/L (ref 9–35)

## 2023-04-28 LAB — HEMOGLOBIN AND HEMATOCRIT, BLOOD
HCT: 29.1 % — ABNORMAL LOW (ref 36.0–46.0)
Hemoglobin: 9.2 g/dL — ABNORMAL LOW (ref 12.0–15.0)

## 2023-04-28 LAB — IRON AND TIBC
Iron: 14 ug/dL — ABNORMAL LOW (ref 28–170)
Saturation Ratios: 4 % — ABNORMAL LOW (ref 10.4–31.8)
TIBC: 367 ug/dL (ref 250–450)
UIBC: 353 ug/dL

## 2023-04-28 LAB — RETICULOCYTES
Immature Retic Fract: 32.3 % — ABNORMAL HIGH (ref 2.3–15.9)
RBC.: 3.09 MIL/uL — ABNORMAL LOW (ref 3.87–5.11)
Retic Count, Absolute: 40.5 10*3/uL (ref 19.0–186.0)
Retic Ct Pct: 1.3 % (ref 0.4–3.1)

## 2023-04-28 LAB — PREPARE RBC (CROSSMATCH)

## 2023-04-28 LAB — POC OCCULT BLOOD, ED: Fecal Occult Bld: POSITIVE — AB

## 2023-04-28 LAB — CBG MONITORING, ED: Glucose-Capillary: 125 mg/dL — ABNORMAL HIGH (ref 70–99)

## 2023-04-28 LAB — FOLATE: Folate: 9.4 ng/mL (ref >5.9)

## 2023-04-28 LAB — VITAMIN B12: Vitamin B-12: 221 pg/mL (ref 180–914)

## 2023-04-28 LAB — I-STAT CG4 LACTIC ACID, ED
Lactic Acid, Venous: 1.3 mmol/L (ref 0.5–1.9)
Lactic Acid, Venous: 1 mmol/L (ref 0.5–1.9)

## 2023-04-28 LAB — ETHANOL: Alcohol, Ethyl (B): <10 mg/dL (ref <10)

## 2023-04-28 LAB — APTT: aPTT: 35 s (ref 24–36)

## 2023-04-28 MED ORDER — PANTOPRAZOLE SODIUM 40 MG PO TBEC
40.0000 mg | DELAYED_RELEASE_TABLET | Freq: Every day | ORAL | Status: DC
Start: 2023-04-28 — End: 2023-05-03
  Administered 2023-04-28 – 2023-05-03 (×5): 40 mg via ORAL
  Filled 2023-04-28 (×6): qty 1

## 2023-04-28 MED ORDER — HYDROCHLOROTHIAZIDE 12.5 MG PO TABS
6.2500 mg | ORAL_TABLET | Freq: Every day | ORAL | Status: DC
  Administered 2023-04-29 – 2023-05-03 (×5): 6.25 mg via ORAL
  Filled 2023-04-28 (×5): qty 1

## 2023-04-28 MED ORDER — OXYCODONE HCL 5 MG PO TABS: 10.0000 mg | ORAL_TABLET | Freq: Four times a day (QID) | ORAL | Status: DC | PRN

## 2023-04-28 MED ORDER — IOHEXOL 350 MG/ML SOLN
100.0000 mL | Freq: Once | INTRAVENOUS | Status: AC | PRN
  Administered 2023-04-28: 100 mL via INTRAVENOUS

## 2023-04-28 MED ORDER — SODIUM CHLORIDE 0.9% IV SOLUTION: Freq: Once | INTRAVENOUS | Status: AC

## 2023-04-28 MED ORDER — NALOXONE HCL 0.4 MG/ML IJ SOLN
0.4000 mg | Freq: Once | INTRAMUSCULAR | Status: AC
  Administered 2023-04-28: 0.4 mg via INTRAVENOUS
  Filled 2023-04-28: qty 1

## 2023-04-28 MED ORDER — LAMOTRIGINE 100 MG PO TABS
300.0000 mg | ORAL_TABLET | Freq: Every day | ORAL | Status: DC
  Administered 2023-04-29 – 2023-05-03 (×5): 300 mg via ORAL
  Filled 2023-04-28 (×5): qty 3

## 2023-04-28 MED ORDER — CHLORPROMAZINE HCL 100 MG PO TABS
100.0000 mg | ORAL_TABLET | Freq: Every day | ORAL | Status: DC
  Administered 2023-04-28 – 2023-04-29 (×2): 100 mg via ORAL
  Filled 2023-04-28 (×2): qty 1

## 2023-04-28 MED ORDER — ROSUVASTATIN CALCIUM 5 MG PO TABS
10.0000 mg | ORAL_TABLET | Freq: Every day | ORAL | Status: DC
  Administered 2023-04-28 – 2023-05-03 (×6): 10 mg via ORAL
  Filled 2023-04-28 (×6): qty 2

## 2023-04-28 MED ORDER — PREGABALIN 75 MG PO CAPS
75.0000 mg | ORAL_CAPSULE | Freq: Three times a day (TID) | ORAL | Status: DC
  Administered 2023-04-28 – 2023-04-29 (×3): 75 mg via ORAL
  Filled 2023-04-28 (×3): qty 1

## 2023-04-28 MED ORDER — BISOPROLOL FUMARATE 10 MG PO TABS
10.0000 mg | ORAL_TABLET | Freq: Every day | ORAL | Status: DC
  Administered 2023-04-29 – 2023-05-03 (×5): 10 mg via ORAL
  Filled 2023-04-28 (×5): qty 1

## 2023-04-28 MED ORDER — BISOPROLOL-HYDROCHLOROTHIAZIDE 10-6.25 MG PO TABS
1.0000 | ORAL_TABLET | Freq: Every day | ORAL | Status: DC
  Filled 2023-04-28: qty 1

## 2023-04-28 NOTE — Code Documentation (Signed)
 Stroke Response Nurse Documentation Code Documentation  Katryna Tschirhart is a 68 y.o. female arriving to [facility]  via [facility] on 1/2 with past medical hx of PE, Htn, CKD. On Eliquis  (apixaban ) daily. Code stroke was activated by EMS.   Patient from home where she was LKW at 0100 when she was put back in the bed by family after falling out of it and this morning family found her to be altered and minimally responsive when they tried to wake her up.   Stroke team at the bedside on patient arrival. Labs drawn and patient cleared for CT by Dr. Doretha. Patient to CT with team. NIHSS 17, see documentation for details and code stroke times. Patient with decreased LOC, disoriented, bilateral arm weakness, bilateral leg weakness, Expressive aphasia , and dysarthria  on exam. The following imaging was completed:  CT Head and CTA. Patient is not a candidate for IV Thrombolytic due to being out of the treatment window. Patient is not a candidate for IR due to no LVO on imaging per MD .   Care Plan: q2 NIHSS and vitals until 0100.   Bedside handoff with ED paramedics Dylan and Krista.    Lauraine LITTIE Searle  Stroke Response RN

## 2023-04-28 NOTE — Consult Note (Addendum)
 NEURO HOSPITALIST CONSULT NOTE   Requesting physician: Dr. Doretha   Reason for Consult:Aphasia/AMS/ right side weakness   History obtained from:  Chart review/ family  HPI:                                                                                                                                          Sierra Krueger is an 68 y.o. 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   Per family and EMS the last know normal is unclear. Patient fell out of bed about 1am last night. She was thought to be normal at that time, but family is unsure. When caregiver arrived today the patient was difficult to arouse.   EMS was called. When they arrived patient O2 sat was 80% for which she was placed on a non-rebreather. O2 sat returned to > 92%. At baseline patient can only walk short distances alone. Is unable to complete ADL's on her own.   Date last known well: Date: 04/27/2023 Time last known well: early in the evening. tNK Given: No: due to unclear LKW, likely outside window. Thrombectomy: no, due to no LVO. Modified Rankin: 3  ED course: BG: 171 BP: 130/60 K: 3.3 Creat:1.20 Hgb:6.5 CTH: negative for bleed CTA/PE: negative for LVO, no PE  NIHSS: 17  Past Medical History:  Diagnosis Date   Ambulates with cane    Anxiety    CKD (chronic kidney disease), stage II    Decreased peripheral vision of both eyes    left side due to CVA 2007   History of CVA with residual deficit 12/2005   large right PCA territory subacute infarct without hemorrhage w/ left sided upper/lower extremity weakness and left peripheral vision loss   Hypertension    Left-sided weakness    upper/ lower extremity,  CVA residual 2007   Renal calculus, bilateral    Stroke (HCC) 2007   lt side weakness.   Wears partial dentures    upper    Past Surgical History:  Procedure  Laterality Date   BREAST LUMPECTOMY Left 1997   benign tumor    CYSTOSCOPY/URETEROSCOPY/HOLMIUM LASER/STENT PLACEMENT Bilateral 05/15/2018   Procedure: CYSTOSCOPY/RETROGRADE/URETEROSCOPY/HOLMIUM LASER/BASKET STONE EXTRACTION,  LEFT STENT REPLACEMENT, RIGHT STENT PLACEMENT;  Surgeon: Ottelin, Mark, MD;  Location: [facility];  Service: Urology;  Laterality: Bilateral;   FOOT SURGERY Left 2010   HOLMIUM LASER APPLICATION Bilateral 05/15/2018   Procedure: HOLMIUM LASER APPLICATION;  Surgeon: Ottelin, Mark, MD;  Location: [facility];  Service: Urology;  Laterality: Bilateral;   IR URETERAL STENT LEFT NEW ACCESS W/O SEP NEPHROSTOMY CATH  04/24/2018   LUMBAR SPINE SURGERY  2002   L3 -- 5   NEPHROLITHOTOMY Left 04/24/2018   Procedure: NEPHROLITHOTOMY PERCUTANEOUS;  Surgeon: Ottelin, Mark, MD;  Location: [facility];  Service: Urology;  Laterality: Left;   PARATHYROIDECTOMY N/A 06/22/2019   Procedure: NECK EXPLORATION WITH PARATHYROIDECTOMY WITH PARATHYROID  BIOPSY;  Surgeon: Eletha Boas, MD;  Location: [facility];  Service: General;  Laterality: N/A;   TROCHANTERIC BURSA EXCISION Left 09-02-2016    dr vernetta  @[facility]     No family history on file.            Social History:  reports that she quit smoking about 14 years ago. Her smoking use included cigarettes. She started smoking about 24 years ago. She has never used smokeless tobacco. She reports that she does not currently use alcohol . She reports that she does not use drugs.  Allergies  Allergen Reactions   Codeine Hives   Morphine And Codeine Itching and Nausea And Vomiting   Penicillin G Other (See Comments)   Shellfish Allergy Hives and Swelling    MEDICATIONS:                                                                                                                     I have reviewed the patient's current medications. No current facility-administered medications on file prior to encounter.   Current  Outpatient Medications on File Prior to Encounter  Medication Sig Dispense Refill   APIXABAN  (ELIQUIS ) VTE STARTER PACK (10MG  AND 5MG ) Take as directed on package: start with two-5mg  tablets twice daily for 7 days. On day 8, switch to one-5mg  tablet twice daily. 74 tablet 0   bisoprolol -hydrochlorothiazide  (ZIAC ) 10-6.25 MG tablet Take 1 tablet by mouth daily.     cetirizine (ZYRTEC) 10 MG tablet Take 10 mg by mouth daily as needed for allergies.     chlorproMAZINE  (THORAZINE ) 100 MG tablet Take 100 mg by mouth at bedtime.     chlorproMAZINE  (THORAZINE ) 200 MG tablet Take 200 mg by mouth at bedtime.     diclofenac  Sodium (VOLTAREN ) 1 % GEL Apply 2 g topically daily as needed (for pain).     diltiazem  (CARDIZEM  CD) 120 MG 24 hr capsule Take 1 capsule (120 mg total) by mouth daily. 30 capsule 0   fluticasone  (FLONASE ) 50 MCG/ACT nasal spray Place 1 spray into both nostrils daily as needed for allergies or rhinitis.     hydrOXYzine  (ATARAX ) 10 MG tablet Take 10 mg by mouth 3 (three) times daily as needed for anxiety.     lamoTRIgine  (LAMICTAL ) 150 MG tablet Take 300 mg by mouth daily.     losartan (COZAAR) 25 MG tablet Take 25 mg by mouth daily.     mirtazapine  (REMERON ) 15 MG tablet Take 15 mg by mouth at bedtime.     naloxone  (NARCAN ) nasal spray 4 mg/0.1 mL SMARTSIG:Both Nares     Oxycodone  HCl 10 MG TABS Take 10 mg by mouth 4 (four) times daily as needed (pain).     pantoprazole  (PROTONIX ) 20 MG  tablet Take 20 mg by mouth daily.     pantoprazole  (PROTONIX ) 40 MG tablet Take 40 mg by mouth daily.     pregabalin  (LYRICA ) 25 MG capsule Take 25 mg by mouth 3 (three) times daily as needed (neuropathy).      pregabalin  (LYRICA ) 50 MG capsule Take 50 mg by mouth 3 (three) times daily as needed.     rosuvastatin  (CRESTOR ) 10 MG tablet Take 10 mg by mouth daily.     tiZANidine  (ZANAFLEX ) 4 MG tablet Take 4 mg by mouth every 6 (six) hours as needed for muscle spasms.       ROS:                                                                                                                                        History obtained from chart review and family  General ROS: negative for - chills, fatigue, fever, night sweats, weight gain or weight loss Psychological ROS: negative for - behavioral disorder, hallucinations, memory difficulties, mood swings or suicidal ideation Ophthalmic ROS: negative for - blurry vision, double vision, eye pain or loss of vision ENT ROS: negative for - epistaxis, nasal discharge, oral lesions, sore throat, tinnitus or vertigo Allergy and Immunology ROS: negative for - hives or itchy/watery eyes Hematological and Lymphatic ROS: negative for - bleeding problems, bruising or swollen lymph nodes Endocrine ROS: negative for - galactorrhea, hair pattern changes, polydipsia/polyuria or temperature intolerance Respiratory ROS: negative for - cough, hemoptysis, shortness of breath or wheezing Cardiovascular ROS: negative for - chest pain, dyspnea on exertion, edema or irregular heartbeat Gastrointestinal ROS: negative for - abdominal pain, diarrhea, hematemesis, nausea/vomiting or stool incontinence Genito-Urinary ROS: negative for - dysuria, hematuria, incontinence or urinary frequency/urgency Musculoskeletal ROS: positive for left side weakness Neurological ROS: as noted in HPI Dermatological ROS: negative for rash and skin lesion changes   Weight 74.4 kg.   General Examination:                                                                                                       Physical Exam  Constitutional: Appears well-developed and well-nourished.  Psych: Affect appropriate to situation Eyes: Normal external eye and conjunctiva. HENT: Normocephalic, no lesions, without obvious abnormality.   Musculoskeletal- LUE and LLE with decreased tone Cardiovascular: Normal rate and regular rhythm.  Respiratory: Effort normal, non-labored breathing saturations WNL GI:  Soft.  No distension. There is no tenderness.  Skin: WDI  Neurological Examination Mental  Status: Oriented to name and age. Unable to consistently follow commands. Speech is somewhat garbled.  Cranial Nerves: II: inconsistently blinks to threat.   III,IV, VI: ptosis not present, able to cross midline pupils equal, round, reactive to light and accommodation V,VII: face appears symmetric VIII: hearing normal bilaterally IX,X: uvula rises symmetrically XI: bilateral shoulder shrug XII: midline tongue extension Motor: Right : Upper extremity   4-/5  Left:     Upper extremity   2/5  BLE: does not break gravity. Able to wiggle toes.  Tone and bulk:normal tone throughout; no atrophy noted Sensory: RUE responds to noxious. LUE and BLE did not respond to noxious.  Plantars: Right: downgoing   Left: downgoing Cerebellar: Unable to test Gait: not tested   Lab Results: Basic Metabolic Panel: Recent Labs  Lab 04/28/23 1004  NA 138  K 3.3*  CL 105  GLUCOSE 146*  BUN 13  CREATININE 1.20*    CBC: Recent Labs  Lab 04/28/23 1003 04/28/23 1004  WBC 10.3  --   NEUTROABS 7.7  --   HGB 5.7* 6.5*  HCT 20.1* 19.0*  MCV 70.8*  --   PLT 242  --     Cardiac Enzymes: No results for input(s): CKTOTAL, CKMB, CKMBINDEX, TROPONINI in the last 168 hours.  Lipid Panel: No results for input(s): CHOL, TRIG, HDL, CHOLHDL, VLDL, LDLCALC in the last 168 hours.  Imaging: CT HEAD CODE STROKE WO CONTRAST Result Date: 04/28/2023 CLINICAL DATA:  Code stroke.  Left-sided weakness EXAM: CT HEAD WITHOUT CONTRAST TECHNIQUE: Contiguous axial images were obtained from the base of the skull through the vertex without intravenous contrast. RADIATION DOSE REDUCTION: This exam was performed according to the departmental dose-optimization program which includes automated exposure control, adjustment of the mA and/or kV according to patient size and/or use of iterative reconstruction technique.  COMPARISON:  None Available. FINDINGS: Brain: No evidence of acute infarction, hemorrhage, hydrocephalus, extra-axial collection or mass lesion/mass effect. Dense encephalomalacia in the right occipital lobe extending into the parietal cortex consistent with old infarct. Low-density the inferior right temporal cortex on coronal reformats is attributed to streak artifact based on the other views. Vascular: No hyperdense vessel or unexpected calcification. Skull: Normal. Negative for fracture or focal lesion. Sinuses/Orbits: Negative Other: Prelim sent in epic chat. ASPECTS [facility] Stroke Program Early CT Score) - Ganglionic level infarction (caudate, lentiform nuclei, internal capsule, insula, M1-M3 cortex): 7 - Supraganglionic infarction (M4-M6 cortex): 3 Total score (0-10 with 10 being normal): 10 IMPRESSION: 1. No acute finding. 2. Chronic right occipital parietal infarct. Electronically Signed   By: Dorn Roulette M.D.   On: 04/28/2023 10:16    Assessment: 68 year old female with PMH, PE, CVA (with residual left side deficits who presented to [facility] as a code stroke for aphasia and right sided weakness. NIHSS: 17. CTA: negative for LVO. CTH did not show a hemorrhage. Patient is not a candidate for TPA given unknown LKW.   Impression: Hypoxia vs metabolic encephalopathy vs recrudescence of prior stroke symtpoms. Less likely stroke  Recommendations: _ MRI Brain w/o contrast. If negative for stroke, no further workup.  Harlene Pouch, MSN, NP-C [facility] [phone]  Attending neurologist's note to follow   04/28/2023, 10:28 AM    NEUROHOSPITALIST ADDENDUM Performed a face to face diagnostic evaluation.   I have reviewed the contents of history and physical exam as documented by PA/ARNP/Resident and agree with above documentation.  I have discussed and formulated the above plan as documented. Edits  to the note have been made as needed.  Impression/Key exam findings/Plan:  Has baseline L sided weakness and vision deficit from prior R PCA stroke and has a care giver that helps her out. Able to walk short distances in home, can get to bathroom with assisting devices. Needs help with showers, meals. Son reports that last night, she had a fall and he helped her get up. She was coherent after that. He is not sure when this was, maybe around 0100 or earlier than that. This AM, he thought she was asleep. Care giver, could not get her up. She was mumbling, seemed much weaker on her left side but also trouble moving her right side. They called EMS. Who noted that she was hypoxic, put on non rebreather and brought in as a code stroke for concern for aphasia?. Her speech is very dysarthric and hypophonic and difficult to understand. She is essentially flaccid on the left but much weaker on the right, she is somnolent, poor attention, barely opens eyes and with a lot of encouragement/coaxing, she gives me a thumbs up with her Right hand and wiggles toes on command.  CT Head was negative for any acute intracranial abnormalities, CTA with no LVO.  Of note, her Hb is low at 5.7 with positive stool occult. I wold recommend MRI brain w/o contrast at some point to see if the noted generalized weakness with left side worse than right and severely dysarthric speech is just recrudescence of prior stroke symptoms vs an acute stroke. If the MRI Brain is positive for acute stroke, will need full stroke workup.  Lamichael Youkhana, MD [facility] [phone]   If 7pm to 7am, please call on call as listed on AMION.

## 2023-04-28 NOTE — H&P (Signed)
 Date: 04/28/2023               Patient Name:  Sierra Krueger MRN: [id number]  DOB: 1955-08-08 Age / Sex: 68 y.o., female   PCP: Patient, No Pcp Per         Medical Service: Internal Medicine Teaching Service         Attending Physician: Dr. Rosan, Dayton BROCKS, DO      First Contact: Dr. Ozell Riff, MD Pager [phone]    Second Contact: Dr. [name], MD Pager [phone]         After Hours (After 5p/  First Contact Pager: [phone]  weekends / holidays): Second Contact Pager: [phone]   SUBJECTIVE   Chief Complaint: code stroke  History of Present Illness: Sol Odor is a 68 yo female with HTN, prediabetes, previous cerebral infarction with left sided deficits, GERD, CKD 3a who presents with right sided weakness and aphasia. Last known well was at 1 AM. The patient's son states that the patient had fallen out of bed around 1 AM and she called out for him. He helped her back into bed, but noticed that she was otherwise in her usual state.    This morning the patient's son noticed that she speech was mumbled and she did not seem liked herself, so he called EMS. EMS noted the patient to be hypxoic, tachypenic, aphasic, with right sided weakness, so they called a code stroke. CTA negative for LVO and CTH did not show a hemorrhage. Of note, Hb was found to be low at 5.7 so neuro had recommended MRI to still fully rule out stroke, although recrudescence of prior stroke is certainly on the differential.   When I evaluated the patient she was very somnolent, but would awake to voice. History was very limited due to her somnolence and I spoke with the patient's son, who confirmed the above story. He did also mention that the patient takes oxycodone , pregabalin , and a muscle relaxer and has gotten somonlent after taking them in combination before.    Meds:  Oxycodone  10 mg QID PRN Lyrica  75 mg TID Tizanidine  4 mg q6h PRN  Lamotrigine  300 mg daily  Rosuvastatin  10 mg daily Hydroxyzine  10 mg  TID PRN Zyrtec 10 mg daily Pantoprazole  40 mg daily  Bisoprolol -hydrochlorothiazide  10-6.25 mg daily Aspirin 81 mg ?Remeron  15 mg at bedtime  Chloporomazine 100 mg at bedtime  No outpatient medications have been marked as taking for the 04/28/23 encounter [facility] Encounter).    Past Medical History  Past Surgical History:  Procedure Laterality Date   BREAST LUMPECTOMY Left 1997   benign tumor    CYSTOSCOPY/URETEROSCOPY/HOLMIUM LASER/STENT PLACEMENT Bilateral 05/15/2018   Procedure: CYSTOSCOPY/RETROGRADE/URETEROSCOPY/HOLMIUM LASER/BASKET STONE EXTRACTION,  LEFT STENT REPLACEMENT, RIGHT STENT PLACEMENT;  Surgeon: Ottelin, Mark, MD;  [facility];  Service: Urology;  Laterality: Bilateral;   FOOT SURGERY Left 2010   HOLMIUM LASER APPLICATION Bilateral 05/15/2018   Procedure: HOLMIUM LASER APPLICATION;  Surgeon: Ottelin, Mark, MD;  Location: [facility];  Service: Urology;  Laterality: Bilateral;   IR URETERAL STENT LEFT NEW ACCESS W/O SEP NEPHROSTOMY CATH  04/24/2018   LUMBAR SPINE SURGERY  2002   L3 -- 5   NEPHROLITHOTOMY Left 04/24/2018   Procedure: NEPHROLITHOTOMY PERCUTANEOUS;  Surgeon: Ottelin, Mark, MD;  Location: [facility];  Service: Urology;  Laterality: Left;   PARATHYROIDECTOMY N/A 06/22/2019   Procedure: NECK EXPLORATION WITH PARATHYROIDECTOMY WITH PARATHYROID  BIOPSY;  Surgeon: Eletha Boas, MD;  Location: [facility];  Service: General;  Laterality: N/A;   TROCHANTERIC BURSA EXCISION Left 09-02-2016    dr vernetta  @[facility]     Social:  Lives With: son in [place]  Support: family Level of Function: independent of ADLs but does sometimes need help getting dressed  PCP: [facility] Substances: no alcohol , former tobacco use   Allergies: Allergies as of 04/28/2023 - Review Complete 04/28/2023  Allergen Reaction Noted   Codeine Hives 11/18/2010   Morphine and codeine Itching and Nausea And Vomiting 11/18/2010   Penicillin g Other  (See Comments) 07/24/2012   Shellfish allergy Hives and Swelling 04/10/2018    Review of Systems: A complete ROS was negative except as per HPI.   OBJECTIVE:   Physical Exam: Blood pressure (!) 142/87, pulse (!) 115, resp. rate 17, weight 74.4 kg, SpO2 99%.  Constitutional: ill-appearing elderly female laying in bed, no acute distress Cardiovascular: regular rate and rhythm Pulmonary/Chest: normal work of breathing on room air, lungs clear to auscultation anteriorly Abdominal: soft, non-tender, non-distended MSK: no LE edema Neurological: Somnolent, but oriented to self, place, and year but not to situation. Moves all extremities spontaneously on my exam. CN II-XII intact.  Skin: warm and dry  Labs: CBC    Component Value Date/Time   WBC 10.3 04/28/2023 1003   RBC 3.09 (L) 04/28/2023 1220   RBC 2.84 (L) 04/28/2023 1003   HGB 6.5 (LL) 04/28/2023 1004   HCT 19.0 (L) 04/28/2023 1004   PLT 242 04/28/2023 1003   MCV 70.8 (L) 04/28/2023 1003   MCH 20.1 (L) 04/28/2023 1003   MCHC 28.4 (L) 04/28/2023 1003   RDW 19.4 (H) 04/28/2023 1003   LYMPHSABS 1.3 04/28/2023 1003   MONOABS 1.1 (H) 04/28/2023 1003   EOSABS 0.1 04/28/2023 1003   BASOSABS 0.0 04/28/2023 1003     CMP     Component Value Date/Time   NA 138 04/28/2023 1004   K 3.3 (L) 04/28/2023 1004   CL 105 04/28/2023 1004   CO2 21 (L) 04/28/2023 1003   GLUCOSE 146 (H) 04/28/2023 1004   BUN 13 04/28/2023 1004   CREATININE 1.20 (H) 04/28/2023 1004   CALCIUM  8.1 (L) 04/28/2023 1003   PROT 5.7 (L) 04/28/2023 1003   ALBUMIN 2.7 (L) 04/28/2023 1003   AST 14 (L) 04/28/2023 1003   ALT 8 04/28/2023 1003   ALKPHOS 52 04/28/2023 1003   BILITOT 0.4 04/28/2023 1003   GFRNONAA 51 (L) 04/28/2023 1003   GFRAA 49 (L) 06/28/2019 1024    Imaging: CT Head: negative for hemorrhage CTA head/neck: negative  EKG: personally reviewed my interpretation is sinus tachycardia. Prior EKG reviewed with no significant changes seen.    ASSESSMENT & PLAN:   Assessment & Plan by Problem: Principal Problem:   Acute on chronic anemia   Diana Davenport is a 68 y.o. person living with a history of HTN, prediabetes, previous cerebral infarction with left sided deficits, GERD, CKD 3a who presented with aphasia and right sided weakness and admitted for stroke rule out and acute on chronic anemia on hospital day 0  Stroke rule out History of previous cerebral infarction with left sided deficits Presented with aphasia and right sided weakness, in addition to somnolence. CTH and CTA negative, however, MRI was not done. Differential includes new cerebral infarction vs recrusdescence of her symptoms in the setting of her anemia. Somnolence could also be secondary to multiple centrally acting medications.  - Obtain MRI brain (if positive, will need full stroke work up) - Hold hydroxyzine ,  tizanidine  - Resume lyrica  and oxycodone  (with holding parameters) - PT/OT/SLP  Microcytic anemia Hb 5.7 with MCV of 70.8. Hemoccult positive in the ED, although patient is not able to tell me if she is having any melena or hematochezia. Reportedly, the patient's PCP had been working up her anemia as an outpatient.  - 2u PRBCs transfusing - Follow up post-transfusion H&H - May need GI consult in the morning - Holding aspirin   Chronic pain Complex regional pain syndrome The patient has been on oxycodone  and pregabalin  dating back to January 2023 according to PDMP. Resumed oxycodone  and pregabalin , but will hold tizanidine  as the combination of the three is likely contributing to her somnolence.  Psychiatric disorder Unclear what the patient's diagnosis is, but she is on lamictal  300 mg daily, hydroxyzine  PRN, and chlorpromazine  100 mg at bedtime. Resumed lamictal  and chlorpromazine , will hold hydroxyzine  in setting of somnolence.  Unclear if she is still on Remeron .  HTN Resumed home bisprolol 10-6.25 mg daily.    Diet:  Full liquid VTE:  SCDs IVF: None,None Code: Full  Prior to Admission Living Arrangement: Home, living son Anticipated Discharge Location: SNF vs home Barriers to Discharge: medical stability  Dispo: Admit patient to Observation with expected length of stay less than 2 midnights.  Signed: Presleigh Feldstein N, DO Internal Medicine Resident PGY-3  04/28/2023, 5:05 PM

## 2023-04-28 NOTE — Progress Notes (Signed)
 PT Cancellation Note  Patient Details Name: Sierra Krueger MRN: [id number] DOB: [birth year]   Cancelled Treatment:    Reason Eval/Treat Not Completed: Other (comment)  Imminent d/c order acknowledged. Placed at 1346 this afternoon. Spoke with paramedic, pt has low hgb and very unlikely to d/c today. If comprehensive PT evaluation required for d/c today please reach out to myself or [facility] PT via secure chat. Otherwise, we will plan to follow-up early tomorrow for evaluation.  Thank you, [name], PT, DPT [facility]  Rehabilitation Services Physical Therapist Office: [phone] Website: [email or web address]    [name] 04/28/2023, 2:39 PM

## 2023-04-28 NOTE — ED Notes (Signed)
 ED TO INPATIENT HANDOFF REPORT  ED Nurse Name and Phone #: Jerona ANDREWS  S Name/Age/Gender Sierra Krueger 68 y.o. female Room/Bed: 038C/038C  Code Status   Code Status: Full Code  Home/SNF/Other Home Patient oriented to: self, place, and time Is this baseline? Yes     Triage Complete: Triage complete  Chief Complaint Acute on chronic anemia [D64.9]  Triage Note Patient arrives from home as Code stroke, LKW 0100 today, at which time she fell out of bed and was put back into bed by family who states she was acting normally at that time. Family attempted to wake her up this morning and she was minimally responsive with R sided weakness and aphasia, so code stroke was activated by EMS. SpO2 80% on room air, so she was placed on NRB and given albuterol.    Allergies Allergies  Allergen Reactions   Codeine Hives   Morphine And Codeine Itching and Nausea And Vomiting   Penicillin G Other (See Comments)   Shellfish Allergy Hives and Swelling    Level of Care/Admitting Diagnosis ED Disposition     ED Disposition  Admit   Condition  --   Comment  Hospital Area: [facility] [[id number]]  Level of Care: Telemetry Medical [104]  May place patient in observation at [facility] or [facility] if equivalent level of care is available:: No  Covid Evaluation: Confirmed COVID Negative  Diagnosis: Acute on chronic anemia [8170889]  Admitting Physician: ROSAN DAYTON BROCKS [[id number]]  Attending Physician: ROSAN DAYTON BROCKS LORINE          B Medical/Surgery History Past Medical History:  Diagnosis Date   Ambulates with cane    Anxiety    CKD (chronic kidney disease), stage II    Decreased peripheral vision of both eyes    left side due to CVA 2007   History of CVA with residual deficit 12/2005   large right PCA territory subacute infarct without hemorrhage w/ left sided upper/lower extremity weakness and left peripheral vision loss   Hypertension    Left-sided weakness     upper/ lower extremity,  CVA residual 2007   Renal calculus, bilateral    Stroke (HCC) 2007   lt side weakness.   Wears partial dentures    upper   Past Surgical History:  Procedure Laterality Date   BREAST LUMPECTOMY Left 1997   benign tumor    CYSTOSCOPY/URETEROSCOPY/HOLMIUM LASER/STENT PLACEMENT Bilateral 05/15/2018   Procedure: CYSTOSCOPY/RETROGRADE/URETEROSCOPY/HOLMIUM LASER/BASKET STONE EXTRACTION,  LEFT STENT REPLACEMENT, RIGHT STENT PLACEMENT;  Surgeon: Ottelin, Mark, MD;  Location: [facility];  Service: Urology;  Laterality: Bilateral;   FOOT SURGERY Left 2010   HOLMIUM LASER APPLICATION Bilateral 05/15/2018   Procedure: HOLMIUM LASER APPLICATION;  Surgeon: Ottelin, Mark, MD;  Location: [facility];  Service: Urology;  Laterality: Bilateral;   IR URETERAL STENT LEFT NEW ACCESS W/O SEP NEPHROSTOMY CATH  04/24/2018   LUMBAR SPINE SURGERY  2002   L3 -- 5   NEPHROLITHOTOMY Left 04/24/2018   Procedure: NEPHROLITHOTOMY PERCUTANEOUS;  Surgeon: Ottelin, Mark, MD;  Location: [facility];  Service: Urology;  Laterality: Left;   PARATHYROIDECTOMY N/A 06/22/2019   Procedure: NECK EXPLORATION WITH PARATHYROIDECTOMY WITH PARATHYROID  BIOPSY;  Surgeon: Eletha Boas, MD;  Location: [facility];  Service: General;  Laterality: N/A;   TROCHANTERIC BURSA EXCISION Left 09-02-2016    dr vernetta  @[facility]      A IV Location/Drains/Wounds Patient Lines/Drains/Airways Status     Active Line/Drains/Airways  Name Placement date Placement time Site Days   Peripheral IV 04/28/23 18 G Right Antecubital 04/28/23  0956  Antecubital  less than 1   Peripheral IV 04/28/23 18 G Left Antecubital 04/28/23  1037  Antecubital  less than 1   Ureteral Drain/Stent Left ureter 6 Fr. 05/15/18  0926  Left ureter  1809   Ureteral Drain/Stent Right ureter 6 Fr. 05/15/18  0922  Right ureter  1809            Intake/Output Last 24 hours No intake or output data in the 24 hours ending 04/28/23  1508  Labs/Imaging Results for orders placed or performed during the hospital encounter of 04/28/23 (from the past 48 hours)  CBG monitoring, ED     Status: Abnormal   Collection Time: 04/28/23  9:58 AM  Result Value Ref Range   Glucose-Capillary 125 (H) 70 - 99 mg/dL    Comment: Glucose reference range applies only to samples taken after fasting for at least 8 hours.  Ethanol     Status: None   Collection Time: 04/28/23 10:03 AM  Result Value Ref Range   Alcohol , Ethyl (B) <10 <10 mg/dL    Comment: (NOTE) Lowest detectable limit for serum alcohol  is 10 mg/dL.  For medical purposes only. Performed at [facility], [address] [place], [place] [address]   Protime-INR     Status: Abnormal   Collection Time: 04/28/23 10:03 AM  Result Value Ref Range   Prothrombin Time 16.4 (H) 11.4 - 15.2 seconds   INR 1.3 (H) 0.8 - 1.2    Comment: (NOTE) INR goal varies based on device and disease states. Performed at [facility], [address], [place], [place] [address]   APTT     Status: None   Collection Time: 04/28/23 10:03 AM  Result Value Ref Range   aPTT 35 24 - 36 seconds    Comment: Performed at [facility], [address], [place], [place] [address]  CBC     Status: Abnormal   Collection Time: 04/28/23 10:03 AM  Result Value Ref Range   WBC 10.3 4.0 - 10.5 K/uL   RBC 2.84 (L) 3.87 - 5.11 MIL/uL   Hemoglobin 5.7 (LL) 12.0 - 15.0 g/dL    Comment: REPEATED TO VERIFY Reticulocyte Hemoglobin testing may be clinically indicated, consider ordering this additional test [id number] THIS CRITICAL RESULT HAS VERIFIED AND BEEN CALLED TO DYLAN GRAY,RN BY ZELDA BEECH ON 01 02 2025 AT 1017, AND HAS BEEN READ BACK.     HCT 20.1 (L) 36.0 - 46.0 %   MCV 70.8 (L) 80.0 - 100.0 fL   MCH 20.1 (L) 26.0 - 34.0 pg   MCHC 28.4 (L) 30.0 - 36.0 g/dL   RDW 80.5 (H) 88.4 - 84.4 %   Platelets 242 150 - 400 K/uL   nRBC 0.0 0.0 - 0.2 %    Comment: Performed at [facility]  Lab, [address] [place], [place] [address]  Differential     Status: Abnormal   Collection Time: 04/28/23 10:03 AM  Result Value Ref Range   Neutrophils Relative % 75 %   Neutro Abs 7.7 1.7 - 7.7 K/uL   Lymphocytes Relative 12 %   Lymphs Abs 1.3 0.7 - 4.0 K/uL   Monocytes Relative 11 %   Monocytes Absolute 1.1 (H) 0.1 - 1.0 K/uL   Eosinophils Relative 1 %   Eosinophils Absolute 0.1 0.0 - 0.5 K/uL  Basophils Relative 0 %   Basophils Absolute 0.0 0.0 - 0.1 K/uL   Immature Granulocytes 1 %   Abs Immature Granulocytes 0.05 0.00 - 0.07 K/uL    Comment: Performed at [facility], [address], [place], [place] [address]  Comprehensive metabolic panel     Status: Abnormal   Collection Time: 04/28/23 10:03 AM  Result Value Ref Range   Sodium 138 135 - 145 mmol/L   Potassium 3.3 (L) 3.5 - 5.1 mmol/L   Chloride 107 98 - 111 mmol/L   CO2 21 (L) 22 - 32 mmol/L   Glucose, Bld 146 (H) 70 - 99 mg/dL    Comment: Glucose reference range applies only to samples taken after fasting for at least 8 hours.   BUN 13 8 - 23 mg/dL   Creatinine, Ser 8.81 (H) 0.44 - 1.00 mg/dL   Calcium  8.1 (L) 8.9 - 10.3 mg/dL   Total Protein 5.7 (L) 6.5 - 8.1 g/dL   Albumin 2.7 (L) 3.5 - 5.0 g/dL   AST 14 (L) 15 - 41 U/L   ALT 8 0 - 44 U/L   Alkaline Phosphatase 52 38 - 126 U/L   Total Bilirubin 0.4 0.0 - 1.2 mg/dL   GFR, Estimated 51 (L) >60 mL/min    Comment: (NOTE) Calculated using the CKD-EPI Creatinine Equation (2021)    Anion gap 10 5 - 15    Comment: Performed at [facility], [address], [place], [place] [address]  ABO/Rh     Status: None   Collection Time: 04/28/23 10:03 AM  Result Value Ref Range   ABO/RH(D)      A POS Performed at [facility], [address], [place], [place] [address]   I-STAT, nathanael 8     Status: Abnormal   Collection Time: 04/28/23 10:04 AM  Result Value Ref Range   Sodium 138 135 - 145 mmol/L   Potassium 3.3 (L) 3.5 - 5.1 mmol/L   Chloride 105 98 -  111 mmol/L   BUN 13 8 - 23 mg/dL   Creatinine, Ser 8.79 (H) 0.44 - 1.00 mg/dL   Glucose, Bld 853 (H) 70 - 99 mg/dL    Comment: Glucose reference range applies only to samples taken after fasting for at least 8 hours.   Calcium , Ion 1.07 (L) 1.15 - 1.40 mmol/L   TCO2 21 (L) 22 - 32 mmol/L   Hemoglobin 6.5 (LL) 12.0 - 15.0 g/dL   HCT 80.9 (L) 63.9 - 53.9 %   Comment NOTIFIED PHYSICIAN   I-Stat Lactic Acid, ED     Status: None   Collection Time: 04/28/23 10:53 AM  Result Value Ref Range   Lactic Acid, Venous 1.3 0.5 - 1.9 mmol/L  POC occult blood, ED Provider will collect     Status: Abnormal   Collection Time: 04/28/23 11:26 AM  Result Value Ref Range   Fecal Occult Bld POSITIVE (A) NEGATIVE  Type and screen [facility]     Status: None (Preliminary result)   Collection Time: 04/28/23 11:38 AM  Result Value Ref Range   ABO/RH(D) A POS    Antibody Screen NEG    Sample Expiration 05/01/2023,2359    Unit Number [id number]    Blood Component Type RED CELLS,LR    Unit division 00    Status of Unit ALLOCATED    Transfusion Status OK TO TRANSFUSE    Crossmatch Result Compatible    Unit Number [id number]  Blood Component Type RED CELLS,LR    Unit division 00    Status of Unit ISSUED    Transfusion Status OK TO TRANSFUSE    Crossmatch Result      Compatible Performed at [facility], [address] [place], [place] [address]   Prepare RBC (crossmatch)     Status: None   Collection Time: 04/28/23 11:38 AM  Result Value Ref Range   Order Confirmation      ORDER PROCESSED BY BLOOD BANK Performed at [facility], [address] [place], [place] [address]   Urinalysis, w/ Reflex to Culture (Infection Suspected) -Urine, Clean Catch     Status: Abnormal   Collection Time: 04/28/23 11:50 AM  Result Value Ref Range   Specimen Source URINE, CLEAN CATCH    Color, Urine YELLOW YELLOW   APPearance CLEAR CLEAR   Specific Gravity, Urine 1.021 1.005 -  1.030   pH 5.0 5.0 - 8.0   Glucose, UA NEGATIVE NEGATIVE mg/dL   Hgb urine dipstick NEGATIVE NEGATIVE   Bilirubin Urine NEGATIVE NEGATIVE   Ketones, ur NEGATIVE NEGATIVE mg/dL   Protein, ur NEGATIVE NEGATIVE mg/dL   Nitrite NEGATIVE NEGATIVE   Leukocytes,Ua TRACE (A) NEGATIVE   RBC / HPF 0-5 0 - 5 RBC/hpf   WBC, UA 0-5 0 - 5 WBC/hpf    Comment:        Reflex urine culture not performed if WBC <=10, OR if Squamous epithelial cells >5. If Squamous epithelial cells >5 suggest recollection.    Bacteria, UA NONE SEEN NONE SEEN   Squamous Epithelial / HPF 0-5 0 - 5 /HPF   Mucus PRESENT    Hyaline Casts, UA PRESENT     Comment: Performed at [facility], [address] [place], [place] [address]  I-Stat Lactic Acid, ED     Status: None   Collection Time: 04/28/23 11:50 AM  Result Value Ref Range   Lactic Acid, Venous 1.0 0.5 - 1.9 mmol/L  Resp panel by RT-PCR (RSV, Flu A&B, Covid) Urine, Catheterized     Status: None   Collection Time: 04/28/23 11:53 AM   Specimen: Urine, Catheterized; Nasal Swab  Result Value Ref Range   SARS Coronavirus 2 by RT PCR NEGATIVE NEGATIVE   Influenza A by PCR NEGATIVE NEGATIVE   Influenza B by PCR NEGATIVE NEGATIVE    Comment: (NOTE) The Xpert Xpress SARS-CoV-2/FLU/RSV plus assay is intended as an aid in the diagnosis of influenza from Nasopharyngeal swab specimens and should not be used as a sole basis for treatment. Nasal washings and aspirates are unacceptable for Xpert Xpress SARS-CoV-2/FLU/RSV testing.  Fact Sheet for Patients: bloggercourse.com  Fact Sheet for Healthcare Providers: seriousbroker.it  This test is not yet approved or cleared by the [place]  FDA and has been authorized for detection and/or diagnosis of SARS-CoV-2 by FDA under an Emergency Use Authorization (EUA). This EUA will remain in effect (meaning this test can be used) for the duration of the COVID-19  declaration under Section 564(b)(1) of the Act, 21 U.S.C. section 360bbb-3(b)(1), unless the authorization is terminated or revoked.     Resp Syncytial Virus by PCR NEGATIVE NEGATIVE    Comment: (NOTE) Fact Sheet for Patients: bloggercourse.com  Fact Sheet for Healthcare Providers: seriousbroker.it  This test is not yet approved or cleared by the [place]  FDA and has been authorized for detection and/or diagnosis of SARS-CoV-2 by FDA under an Emergency Use Authorization (EUA). This EUA will remain in  effect (meaning this test can be used) for the duration of the COVID-19 declaration under Section 564(b)(1) of the Act, 21 U.S.C. section 360bbb-3(b)(1), unless the authorization is terminated or revoked.  Performed at [facility], [address], [place], [place] [address]   Vitamin B12     Status: None   Collection Time: 04/28/23 12:20 PM  Result Value Ref Range   Vitamin B-12 221 180 - 914 pg/mL    Comment: (NOTE) This assay is not validated for testing neonatal or myeloproliferative syndrome specimens for Vitamin B12 levels. Performed at [facility], [address], [place], [place] [address]   Folate     Status: None   Collection Time: 04/28/23 12:20 PM  Result Value Ref Range   Folate 9.4 >5.9 ng/mL    Comment: Performed at [facility], [address], [place], [place] [address]  Iron and TIBC     Status: Abnormal   Collection Time: 04/28/23 12:20 PM  Result Value Ref Range   Iron 14 (L) 28 - 170 ug/dL   TIBC 632 749 - 549 ug/dL   Saturation Ratios 4 (L) 10.4 - 31.8 %   UIBC 353 ug/dL    Comment: Performed at [facility], [address] [place], [place] [address]  Ferritin     Status: None   Collection Time: 04/28/23 12:20 PM  Result Value Ref Range   Ferritin 11 11 - 307 ng/mL    Comment: Performed at [facility], [address] [place], [place] [address]  Reticulocytes      Status: Abnormal   Collection Time: 04/28/23 12:20 PM  Result Value Ref Range   Retic Ct Pct 1.3 0.4 - 3.1 %   RBC. 3.09 (L) 3.87 - 5.11 MIL/uL   Retic Count, Absolute 40.5 19.0 - 186.0 K/uL   Immature Retic Fract 32.3 (H) 2.3 - 15.9 %    Comment: Performed at [facility], [address], [place], [place] [address]  Ammonia     Status: None   Collection Time: 04/28/23 12:42 PM  Result Value Ref Range   Ammonia 34 9 - 35 umol/L    Comment: HEMOLYSIS AT THIS LEVEL MAY AFFECT RESULT Performed at [facility], [address] [place], [place] [address]    CT Angio Chest Pulmonary Embolism (PE) W or WO Contrast Result Date: 04/28/2023 CLINICAL DATA:  Hypoxia. EXAM: CT ANGIOGRAPHY CHEST WITH CONTRAST TECHNIQUE: Multidetector CT imaging of the chest was performed using the standard protocol during bolus administration of intravenous contrast. Multiplanar CT image reconstructions and MIPs were obtained to evaluate the vascular anatomy. RADIATION DOSE REDUCTION: This exam was performed according to the departmental dose-optimization program which includes automated exposure control, adjustment of the mA and/or kV according to patient size and/or use of iterative reconstruction technique. CONTRAST:  OMNIPAQUE  IOHEXOL  350 MG/ML SOLN COMPARISON:  November 21, 2021.  October 05, 2018. FINDINGS: Cardiovascular: There is no definite evidence of large pulmonary embolus seen in the main pulmonary artery or main portions of the left and right pulmonary arteries. However, there is limited opacification of the more distal branches, and smaller more peripheral pulmonary emboli cannot be excluded on the basis of this exam. Mild cardiomegaly is noted. No pericardial effusion is noted. Mediastinum/Nodes: 1.6 cm left thyroid  nodule is noted which is enlarged compared to prior exam. No significant adenopathy is noted. Large hiatal hernia is noted with most of the stomach  in the thoracic space. Lungs/Pleura: No  pneumothorax is noted. Mild bibasilar subsegmental atelectasis is noted. Minimal bibasilar subsegmental atelectasis is noted posteriorly in both upper lobes. Upper Abdomen: No acute abnormality. Musculoskeletal: No chest wall abnormality. No acute or significant osseous findings. Review of the MIP images confirms the above findings. IMPRESSION: No definite evidence of large central pulmonary embolus seen in the main pulmonary artery or main portions of the left and right pulmonary arteries. However, there is limited opacification of the more distal branches of the pulmonary arteries, and therefore smaller and more peripheral pulmonary emboli cannot be excluded on the basis of this exam. 1.6 cm left thyroid  nodule is noted which appears to be enlarged compared to prior exam. Recommend thyroid  US . (Ref: J Am Coll Radiol. 2015 Feb;12(2): 143-50). Large sliding-type hiatal hernia is noted with most of the stomach seen in the thoracic space. Mild bibasilar subsegmental atelectasis is noted. Electronically Signed   By: Lynwood Landy Raddle M.D.   On: 04/28/2023 11:53   CT ANGIO HEAD NECK W WO CM (CODE STROKE) Result Date: 04/28/2023 CLINICAL DATA:  Left-sided weakness. EXAM: CT ANGIOGRAPHY HEAD AND NECK WITH AND WITHOUT CONTRAST TECHNIQUE: Multidetector CT imaging of the head and neck was performed using the standard protocol during bolus administration of intravenous contrast. Multiplanar CT image reconstructions and MIPs were obtained to evaluate the vascular anatomy. Carotid stenosis measurements (when applicable) are obtained utilizing NASCET criteria, using the distal internal carotid diameter as the denominator. RADIATION DOSE REDUCTION: This exam was performed according to the departmental dose-optimization program which includes automated exposure control, adjustment of the mA and/or kV according to patient size and/or use of iterative reconstruction technique. CONTRAST:  Dose is not known on this in progress study.  COMPARISON:  Head CT from earlier today. FINDINGS: CTA NECK FINDINGS Aortic arch: 3 vessel branching. Right carotid system: Vessels are smoothly contoured and widely patent without atheromatous change. Left carotid system: Major vessels are smoothly contoured and widely patent without atheromatous change.Intermittent motion artifact and image blurring. Vertebral arteries: Major vessels are smoothly contoured and widely patent without atheromatous change. Intermittent motion artifact. Artifact from intravenous contrast partially obscures the right V1 segment. Skeleton: Generalized degenerative disc narrowing and endplate ridging. Positional kyphosis Other neck: No acute finding. Thyroid  nodules with prior sonographic assessment. Upper chest: Reference dedicated chest CT. Review of the MIP images confirms the above findings CTA HEAD FINDINGS Anterior circulation: Atheromatous calcification of the cavernous carotids. No branch occlusion, beading, or aneurysm. No evidence of vascular malformation. Posterior circulation: Fenestrated proximal basilar. The vertebral and basilar arteries are smoothly contoured and diffusely patent. Less filling of distal right posterior cerebral branches correlating with the chronic infarct. No aneurysm or vascular malformation Venous sinuses: Unremarkable Anatomic variants: None significant Review of the MIP images confirms the above findings IMPRESSION: 1. No emergent finding. 2. Limited atheromatous change without irregularity or significant stenosis of major arteries in the head and neck. Electronically Signed   By: Dorn Roulette M.D.   On: 04/28/2023 10:29   CT HEAD CODE STROKE WO CONTRAST Result Date: 04/28/2023 CLINICAL DATA:  Code stroke.  Left-sided weakness EXAM: CT HEAD WITHOUT CONTRAST TECHNIQUE: Contiguous axial images were obtained from the base of the skull through the vertex without intravenous contrast. RADIATION DOSE REDUCTION: This exam was performed according to the  departmental dose-optimization program which includes automated exposure control, adjustment of the mA and/or kV according to patient size and/or use of iterative reconstruction technique. COMPARISON:  None Available. FINDINGS:  Brain: No evidence of acute infarction, hemorrhage, hydrocephalus, extra-axial collection or mass lesion/mass effect. Dense encephalomalacia in the right occipital lobe extending into the parietal cortex consistent with old infarct. Low-density the inferior right temporal cortex on coronal reformats is attributed to streak artifact based on the other views. Vascular: No hyperdense vessel or unexpected calcification. Skull: Normal. Negative for fracture or focal lesion. Sinuses/Orbits: Negative Other: Prelim sent in epic chat. ASPECTS [facility] Stroke Program Early CT Score) - Ganglionic level infarction (caudate, lentiform nuclei, internal capsule, insula, M1-M3 cortex): 7 - Supraganglionic infarction (M4-M6 cortex): 3 Total score (0-10 with 10 being normal): 10 IMPRESSION: 1. No acute finding. 2. Chronic right occipital parietal infarct. Electronically Signed   By: Dorn Roulette M.D.   On: 04/28/2023 10:16    Pending Labs Unresulted Labs (From admission, onward)     Start     Ordered   04/29/23 0500  HIV Antibody (routine testing w rflx)  (HIV Antibody (Routine testing w reflex) panel)  Tomorrow morning,   R        04/28/23 1344   04/29/23 0500  Basic metabolic panel  Tomorrow morning,   R        04/28/23 1344   04/29/23 0500  CBC  Tomorrow morning,   R        04/28/23 1344   04/28/23 1011  Blood Culture (routine x 2)  (Undifferentiated presentation (screening labs and basic nursing orders))  BLOOD CULTURE X 2,   STAT      04/28/23 1011   04/28/23 0958  Urine rapid drug screen (hosp performed)  Once,   STAT        04/28/23 0958            Vitals/Pain Today's Vitals   04/28/23 1343 04/28/23 1346 04/28/23 1400 04/28/23 1409  BP:  129/87 135/83   Pulse:  (!) 118 (!)  118   Resp:   16   Temp:  98.7 F (37.1 C)    TempSrc:  Oral    SpO2:  100% 100%   Weight:      PainSc: 0-No pain 0-No pain  Asleep    Isolation Precautions No active isolations  Medications Medications  iohexol  (OMNIPAQUE ) 350 MG/ML injection 100 mL (100 mLs Intravenous Contrast Given 04/28/23 1022)  naloxone  (NARCAN ) injection 0.4 mg (0.4 mg Intravenous Given 04/28/23 1114)  0.9 %  sodium chloride  infusion (Manually program via Guardrails IV Fluids) ( Intravenous New Bag/Given 04/28/23 1347)    Mobility Walks short distances with assistive device     Focused Assessments     R Recommendations: See Admitting Provider Note  Report given to:   Additional Notes: Call or message with any questions

## 2023-04-28 NOTE — ED Provider Notes (Signed)
 [name] [facility] AT [facility] Provider Note   CSN: [id number] Arrival date & time: 04/28/23  9046  An emergency department physician performed an initial assessment on this suspected stroke patient at 0957.  History  Chief Complaint  Patient presents with   Code Stroke    Sierra Krueger is a 68 y.o. female.  Pt is a 67y/o female with hx of HTN, CVA with chronic left sided deficits, GERD, CKD who is presenting today as a code stroke.  Patient's last seen normal was 1 AM last night by her family.  They report that she did fall while getting out of bed last night however seemed to be her normal self and this morning when they went to get her she was not acting normal at all.  They noted she had weakness on her right side and aphasia.  They called EMS who noted patient to be hypoxic, tachypneic, aphasic, right-sided weakness and difficulty following commands.  They felt that she was LVO positive and made her a code stroke.  She was seen immediately upon her arrival at the bridge.  She did require a nonrebreather and nasal cannula oxygen.  She did receive 1 neb and route but they report her O2 sat has been normal since that time.  She does not wear oxygen at home.  Also reported that blood sugar was in the 100s.  Family reported patient did not have any recent illness prior to symptoms today.  She does not  take Eliquis  for the last year.    The history is provided by the EMS personnel, medical records and a relative.       Home Medications Prior to Admission medications   Medication Sig Start Date End Date Taking? Authorizing Provider  APIXABAN  (ELIQUIS ) VTE STARTER PACK (10MG  AND 5MG ) Take as directed on package: start with two-5mg  tablets twice daily for 7 days. On day 8, switch to one-5mg  tablet twice daily. 11/24/21   Tobie Yetta HERO, MD  bisoprolol -hydrochlorothiazide  (ZIAC ) 10-6.25 MG tablet Take 1 tablet by mouth daily. 04/05/23   [provider]   cetirizine (ZYRTEC) 10 MG tablet Take 10 mg by mouth daily as needed for allergies.    [provider]  chlorproMAZINE  (THORAZINE ) 100 MG tablet Take 100 mg by mouth at bedtime. 02/16/23   [provider]  chlorproMAZINE  (THORAZINE ) 200 MG tablet Take 200 mg by mouth at bedtime. 10/09/21   [provider]  diclofenac  Sodium (VOLTAREN ) 1 % GEL Apply 2 g topically daily as needed (for pain). 09/29/21   [provider]  diltiazem  (CARDIZEM  CD) 120 MG 24 hr capsule Take 1 capsule (120 mg total) by mouth daily. 11/25/21   Tobie Yetta HERO, MD  fluticasone  (FLONASE ) 50 MCG/ACT nasal spray Place 1 spray into both nostrils daily as needed for allergies or rhinitis.    [provider]  hydrOXYzine  (ATARAX ) 10 MG tablet Take 10 mg by mouth 3 (three) times daily as needed for anxiety. 10/07/21   [provider]  lamoTRIgine  (LAMICTAL ) 150 MG tablet Take 300 mg by mouth daily. 10/07/21   [provider]  losartan (COZAAR) 25 MG tablet Take 25 mg by mouth daily. 10/29/21   [provider]  mirtazapine  (REMERON ) 15 MG tablet Take 15 mg by mouth at bedtime. 10/08/19   [provider]  naloxone  (NARCAN ) nasal spray 4 mg/0.1 mL SMARTSIG:Both Nares 04/18/23   [provider]  Oxycodone  HCl 10 MG TABS Take 10 mg by  mouth 4 (four) times daily as needed (pain).    [provider]  pantoprazole  (PROTONIX ) 20 MG tablet Take 20 mg by mouth daily. 10/22/21   [provider]  pantoprazole  (PROTONIX ) 40 MG tablet Take 40 mg by mouth daily. 04/04/23   [provider]  pregabalin  (LYRICA ) 25 MG capsule Take 25 mg by mouth 3 (three) times daily as needed (neuropathy).  05/21/19   [provider]  pregabalin  (LYRICA ) 50 MG capsule Take 50 mg by mouth 3 (three) times daily as needed. 04/21/23   [provider]  rosuvastatin  (CRESTOR ) 10 MG tablet Take 10 mg by mouth daily. 10/22/21   [provider]   tiZANidine  (ZANAFLEX ) 4 MG tablet Take 4 mg by mouth every 6 (six) hours as needed for muscle spasms. 11/14/19   [provider]      Allergies    Codeine, Morphine and codeine, Penicillin g, and Shellfish allergy    Review of Systems   Review of Systems  Physical Exam Updated Vital Signs BP (!) 142/87   Pulse (!) 115   Resp 17   Wt 74.4 kg   SpO2 99%   BMI 27.29 kg/m  Physical Exam Vitals and nursing note reviewed.  Constitutional:      General: She is in acute distress.     Appearance: She is well-developed.  HENT:     Head: Normocephalic and atraumatic.     Mouth/Throat:     Mouth: Mucous membranes are dry.  Eyes:     Pupils: Pupils are equal, round, and reactive to light.  Cardiovascular:     Rate and Rhythm: Regular rhythm. Tachycardia present.     Heart sounds: Normal heart sounds. No murmur heard.    No friction rub.  Pulmonary:     Effort: Pulmonary effort is normal.     Breath sounds: Normal breath sounds. No wheezing or rales.     Comments: Coarse upper airway noises Abdominal:     General: Bowel sounds are normal. There is no distension.     Palpations: Abdomen is soft.     Tenderness: There is no abdominal tenderness. There is no guarding or rebound.  Genitourinary:    Comments: No stool in vault and faint bright blood with rectal exam could be related to hemorrhoids Musculoskeletal:        General: No tenderness. Normal range of motion.     Comments: No edema  Skin:    General: Skin is warm and dry.     Coloration: Skin is pale.     Findings: No rash.     Comments: Feels warm to touch  Neurological:     Mental Status: She is lethargic.     Comments: Due to patient's lethargy difficult exam but appears to have 3 out of 5 strength in the right upper and lower extremity.  Does not move her left leg at all but has 3 out of 5 strength in the left upper extremity with decreased tone.  She can follow some commands but not consistently.  When  attempting to smile both sides of the mouth seem to move.  However when trying to speak her speech is not comprehensible  Psychiatric:        Behavior: Behavior normal.     ED Results / Procedures / Treatments   Labs (all labs ordered are listed, but only abnormal results are displayed) Labs Reviewed  PROTIME-INR - Abnormal; Notable for the following components:  Result Value   Prothrombin Time 16.4 (*)    INR 1.3 (*)    All other components within normal limits  CBC - Abnormal; Notable for the following components:   RBC 2.84 (*)    Hemoglobin 5.7 (*)    HCT 20.1 (*)    MCV 70.8 (*)    MCH 20.1 (*)    MCHC 28.4 (*)    RDW 19.4 (*)    All other components within normal limits  DIFFERENTIAL - Abnormal; Notable for the following components:   Monocytes Absolute 1.1 (*)    All other components within normal limits  COMPREHENSIVE METABOLIC PANEL - Abnormal; Notable for the following components:   Potassium 3.3 (*)    CO2 21 (*)    Glucose, Bld 146 (*)    Creatinine, Ser 1.18 (*)    Calcium  8.1 (*)    Total Protein 5.7 (*)    Albumin 2.7 (*)    AST 14 (*)    GFR, Estimated 51 (*)    All other components within normal limits  URINALYSIS, W/ REFLEX TO CULTURE (INFECTION SUSPECTED) - Abnormal; Notable for the following components:   Leukocytes,Ua TRACE (*)    All other components within normal limits  CBG MONITORING, ED - Abnormal; Notable for the following components:   Glucose-Capillary 125 (*)    All other components within normal limits  POCT I-STAT, CHEM 8 - Abnormal; Notable for the following components:   Potassium 3.3 (*)    Creatinine, Ser 1.20 (*)    Glucose, Bld 146 (*)    Calcium , Ion 1.07 (*)    TCO2 21 (*)    Hemoglobin 6.5 (*)    HCT 19.0 (*)    All other components within normal limits  POC OCCULT BLOOD, ED - Abnormal; Notable for the following components:   Fecal Occult Bld POSITIVE (*)    All other components within normal limits  CULTURE, BLOOD  (ROUTINE X 2)  CULTURE, BLOOD (ROUTINE X 2)  RESP PANEL BY RT-PCR (RSV, FLU A&B, COVID)  RVPGX2  ETHANOL  APTT  RAPID URINE DRUG SCREEN, HOSP PERFORMED  VITAMIN B12  FOLATE  IRON AND TIBC  FERRITIN  RETICULOCYTES  AMMONIA  I-STAT CHEM 8, ED  I-STAT CG4 LACTIC ACID, ED  I-STAT CG4 LACTIC ACID, ED  TYPE AND SCREEN  ABO/RH  PREPARE RBC (CROSSMATCH)    EKG EKG Interpretation Date/Time:  Thursday April 28 2023 11:01:04 EST Ventricular Rate:  110 PR Interval:  188 QRS Duration:  83 QT Interval:  344 QTC Calculation: 466 R Axis:   64  Text Interpretation: Sinus tachycardia Anteroseptal infarct, age indeterminate No significant change since last tracing Confirmed by Doretha Folks ([id number]) on 04/28/2023 12:10:54 PM  Radiology CT Angio Chest Pulmonary Embolism (PE) W or WO Contrast Result Date: 04/28/2023 CLINICAL DATA:  Hypoxia. EXAM: CT ANGIOGRAPHY CHEST WITH CONTRAST TECHNIQUE: Multidetector CT imaging of the chest was performed using the standard protocol during bolus administration of intravenous contrast. Multiplanar CT image reconstructions and MIPs were obtained to evaluate the vascular anatomy. RADIATION DOSE REDUCTION: This exam was performed according to the departmental dose-optimization program which includes automated exposure control, adjustment of the mA and/or kV according to patient size and/or use of iterative reconstruction technique. CONTRAST:  OMNIPAQUE  IOHEXOL  350 MG/ML SOLN COMPARISON:  November 21, 2021.  October 05, 2018. FINDINGS: Cardiovascular: There is no definite evidence of large pulmonary embolus seen in the main pulmonary artery or main portions of the left  and right pulmonary arteries. However, there is limited opacification of the more distal branches, and smaller more peripheral pulmonary emboli cannot be excluded on the basis of this exam. Mild cardiomegaly is noted. No pericardial effusion is noted. Mediastinum/Nodes: 1.6 cm left thyroid  nodule is noted  which is enlarged compared to prior exam. No significant adenopathy is noted. Large hiatal hernia is noted with most of the stomach in the thoracic space. Lungs/Pleura: No pneumothorax is noted. Mild bibasilar subsegmental atelectasis is noted. Minimal bibasilar subsegmental atelectasis is noted posteriorly in both upper lobes. Upper Abdomen: No acute abnormality. Musculoskeletal: No chest wall abnormality. No acute or significant osseous findings. Review of the MIP images confirms the above findings. IMPRESSION: No definite evidence of large central pulmonary embolus seen in the main pulmonary artery or main portions of the left and right pulmonary arteries. However, there is limited opacification of the more distal branches of the pulmonary arteries, and therefore smaller and more peripheral pulmonary emboli cannot be excluded on the basis of this exam. 1.6 cm left thyroid  nodule is noted which appears to be enlarged compared to prior exam. Recommend thyroid  US . (Ref: J Am Coll Radiol. 2015 Feb;12(2): 143-50). Large sliding-type hiatal hernia is noted with most of the stomach seen in the thoracic space. Mild bibasilar subsegmental atelectasis is noted. Electronically Signed   By: Lynwood Landy Raddle M.D.   On: 04/28/2023 11:53   CT ANGIO HEAD NECK W WO CM (CODE STROKE) Result Date: 04/28/2023 CLINICAL DATA:  Left-sided weakness. EXAM: CT ANGIOGRAPHY HEAD AND NECK WITH AND WITHOUT CONTRAST TECHNIQUE: Multidetector CT imaging of the head and neck was performed using the standard protocol during bolus administration of intravenous contrast. Multiplanar CT image reconstructions and MIPs were obtained to evaluate the vascular anatomy. Carotid stenosis measurements (when applicable) are obtained utilizing NASCET criteria, using the distal internal carotid diameter as the denominator. RADIATION DOSE REDUCTION: This exam was performed according to the departmental dose-optimization program which includes automated exposure  control, adjustment of the mA and/or kV according to patient size and/or use of iterative reconstruction technique. CONTRAST:  Dose is not known on this in progress study. COMPARISON:  Head CT from earlier today. FINDINGS: CTA NECK FINDINGS Aortic arch: 3 vessel branching. Right carotid system: Vessels are smoothly contoured and widely patent without atheromatous change. Left carotid system: Major vessels are smoothly contoured and widely patent without atheromatous change.Intermittent motion artifact and image blurring. Vertebral arteries: Major vessels are smoothly contoured and widely patent without atheromatous change. Intermittent motion artifact. Artifact from intravenous contrast partially obscures the right V1 segment. Skeleton: Generalized degenerative disc narrowing and endplate ridging. Positional kyphosis Other neck: No acute finding. Thyroid  nodules with prior sonographic assessment. Upper chest: Reference dedicated chest CT. Review of the MIP images confirms the above findings CTA HEAD FINDINGS Anterior circulation: Atheromatous calcification of the cavernous carotids. No branch occlusion, beading, or aneurysm. No evidence of vascular malformation. Posterior circulation: Fenestrated proximal basilar. The vertebral and basilar arteries are smoothly contoured and diffusely patent. Less filling of distal right posterior cerebral branches correlating with the chronic infarct. No aneurysm or vascular malformation Venous sinuses: Unremarkable Anatomic variants: None significant Review of the MIP images confirms the above findings IMPRESSION: 1. No emergent finding. 2. Limited atheromatous change without irregularity or significant stenosis of major arteries in the head and neck. Electronically Signed   By: Dorn Roulette M.D.   On: 04/28/2023 10:29   CT HEAD CODE STROKE WO CONTRAST Result Date: 04/28/2023 CLINICAL DATA:  Code stroke.  Left-sided weakness EXAM: CT HEAD WITHOUT CONTRAST TECHNIQUE:  Contiguous axial images were obtained from the base of the skull through the vertex without intravenous contrast. RADIATION DOSE REDUCTION: This exam was performed according to the departmental dose-optimization program which includes automated exposure control, adjustment of the mA and/or kV according to patient size and/or use of iterative reconstruction technique. COMPARISON:  None Available. FINDINGS: Brain: No evidence of acute infarction, hemorrhage, hydrocephalus, extra-axial collection or mass lesion/mass effect. Dense encephalomalacia in the right occipital lobe extending into the parietal cortex consistent with old infarct. Low-density the inferior right temporal cortex on coronal reformats is attributed to streak artifact based on the other views. Vascular: No hyperdense vessel or unexpected calcification. Skull: Normal. Negative for fracture or focal lesion. Sinuses/Orbits: Negative Other: Prelim sent in epic chat. ASPECTS [facility] Stroke Program Early CT Score) - Ganglionic level infarction (caudate, lentiform nuclei, internal capsule, insula, M1-M3 cortex): 7 - Supraganglionic infarction (M4-M6 cortex): 3 Total score (0-10 with 10 being normal): 10 IMPRESSION: 1. No acute finding. 2. Chronic right occipital parietal infarct. Electronically Signed   By: Dorn Roulette M.D.   On: 04/28/2023 10:16    Procedures Procedures    Medications Ordered in ED Medications  0.9 %  sodium chloride  infusion (Manually program via Guardrails IV Fluids) (has no administration in time range)  iohexol  (OMNIPAQUE ) 350 MG/ML injection 100 mL (100 mLs Intravenous Contrast Given 04/28/23 1022)  naloxone  (NARCAN ) injection 0.4 mg (0.4 mg Intravenous Given 04/28/23 1114)    ED Course/ Medical Decision Making/ A&P                                 Medical Decision Making Amount and/or Complexity of Data Reviewed Independent Historian: caregiver and EMS External Data Reviewed: notes. Labs: ordered. Decision-making  details documented in ED Course. Radiology: ordered and independent interpretation performed. Decision-making details documented in ED Course. ECG/medicine tests: ordered and independent interpretation performed. Decision-making details documented in ED Course.  Risk Prescription drug management. Decision regarding hospitalization.   Pt with multiple medical problems and comorbidities and presenting today with a complaint that caries a high risk for morbidity and mortality.  Here today with above complaints.  Concern for possible intracranial bleed as she did have a fall last night and is now altered versus stroke versus sepsis and metabolic encephalopathy.  No history of liver disease concerning for hyperammonemia.  Patient has been on oxycodone  tablets in the past possibility for medication reaction.  Also patient does have prior history of PE is currently on Eliquis  and lower suspicion for acute PE at this time but given patient's hypoxia upon arrival will do imaging to ensure no evidence worsening PE, new pneumonia or fluid.  Blood pressure has been stable.  1:03 PM I independently interpreted patient's labs and EKG.  EKG without acute changes, lactic acid normal, rectal temperature is normal, fecal occult is positive however patient did have some bright red blood most likely from hemorrhoids.  CBC today with a hemoglobin of 5.7 from 12 in August and CMP with mild hypokalemia of 3.3 but no other acute findings.  Family is now present at bedside and discussed the findings with them.  They report that patient would be amenable to blood transfusion.  They did reports she saw her doctor recently and told her that her blood counts were low and she was going to be following up but had not done  it yet.  They also report that she does continue to take pain medication and thinks that her doctor recently gave her a muscle relaxer and she is on pregabalin .  Discussed with them if she possibly had too much  medication which they are worried about.  Will try a small dose of Narcan  to see if that improves patient's mentation.  Blood transfusion of 2 units was also ordered.  Anemia panel sent.   Neurology has cleared the patient from stroke.  I have independently visualized and interpreted pt's images today.  CT and CTA of the head and neck are negative for bleed or acute occlusion.  CT PE is negative for large occlusive PE or other acute findings.  Radiology reports limited atheromatous changes without irregularity or significant stenosis of major arteries and no definitive PE but does note a 1.6 cm left thyroid  nodule which appears to be enlarged compared to prior exam which will need an ultrasound in the future and a large sliding hiatal hernia with some atelectasis.  1:03 PM Pt did not respond to narcan .  Suspect other medication that may be causing lethargy.  Pt given blood.  Consulted with internal medicine for admission and discussed findings with pt's family who consented for the blood and confirmed that pt is a full code.  CRITICAL CARE Performed by: Ambriana Selway Total critical care time: 40 minutes Critical care time was exclusive of separately billable procedures and treating other patients. Critical care was necessary to treat or prevent imminent or life-threatening deterioration. Critical care was time spent personally by me on the following activities: development of treatment plan with patient and/or surrogate as well as nursing, discussions with consultants, evaluation of patient's response to treatment, examination of patient, obtaining history from patient or surrogate, ordering and performing treatments and interventions, ordering and review of laboratory studies, ordering and review of radiographic studies, pulse oximetry and re-evaluation of patient's condition.        Final Clinical Impression(s) / ED Diagnoses Final diagnoses:  Symptomatic anemia  Somnolence  Adverse  effect of drug, initial encounter    Rx / DC Orders ED Discharge Orders     None         Doretha Folks, MD 04/28/23 1303

## 2023-04-28 NOTE — Progress Notes (Signed)
 OT Cancellation Note  Patient Details Name: Jorden Mahl MRN: [id number] DOB: January 11, 1956   Cancelled Treatment:    Reason Eval/Treat Not Completed: Patient not medically ready (Low Hgb. Discussed with PT. Will follow up tomorrow. If OT eval needed before tomorrow, please call [phone] or send secure chat to [facility] OT. thanks)  Tavio Biegel,HILLARY 04/28/2023, 3:00 PM Kreg Sink, OT/L   Acute OT Clinical Specialist Acute Rehabilitation Services Pager [phone] Office [phone]

## 2023-04-28 NOTE — Evaluation (Signed)
 Clinical/Bedside Swallow Evaluation Patient Details  Name: Sierra Krueger MRN: [id number] Date of Birth: 1955-09-29  Today's Date: 04/28/2023 Time: SLP Start Time (ACUTE ONLY): 1513 SLP Stop Time (ACUTE ONLY): 1526 SLP Time Calculation (min) (ACUTE ONLY): 13 min  Past Medical History:  Past Medical History:  Diagnosis Date   Ambulates with cane    Anxiety    CKD (chronic kidney disease), stage II    Decreased peripheral vision of both eyes    left side due to CVA 2007   History of CVA with residual deficit 12/2005   large right PCA territory subacute infarct without hemorrhage w/ left sided upper/lower extremity weakness and left peripheral vision loss   Hypertension    Left-sided weakness    upper/ lower extremity,  CVA residual 2007   Renal calculus, bilateral    Stroke (HCC) 2007   lt side weakness.   Wears partial dentures    upper   Past Surgical History:  Past Surgical History:  Procedure Laterality Date   BREAST LUMPECTOMY Left 1997   benign tumor    CYSTOSCOPY/URETEROSCOPY/HOLMIUM LASER/STENT PLACEMENT Bilateral 05/15/2018   Procedure: CYSTOSCOPY/RETROGRADE/URETEROSCOPY/HOLMIUM LASER/BASKET STONE EXTRACTION,  LEFT STENT REPLACEMENT, RIGHT STENT PLACEMENT;  Surgeon: Krueger, Mark, MD;  Location: [facility];  Service: Urology;  Laterality: Bilateral;   FOOT SURGERY Left 2010   HOLMIUM LASER APPLICATION Bilateral 05/15/2018   Procedure: HOLMIUM LASER APPLICATION;  Surgeon: Krueger, Mark, MD;  Location: [facility];  Service: Urology;  Laterality: Bilateral;   IR URETERAL STENT LEFT NEW ACCESS W/O SEP NEPHROSTOMY CATH  04/24/2018   LUMBAR SPINE SURGERY  2002   L3 -- 5   NEPHROLITHOTOMY Left 04/24/2018   Procedure: NEPHROLITHOTOMY PERCUTANEOUS;  Surgeon: Krueger, Mark, MD;  Location: [facility];  Service: Urology;  Laterality: Left;   PARATHYROIDECTOMY N/A 06/22/2019   Procedure: NECK EXPLORATION WITH PARATHYROIDECTOMY WITH PARATHYROID  BIOPSY;  Surgeon:  Sierra Boas, MD;  Location: [facility];  Service: General;  Laterality: N/A;   TROCHANTERIC BURSA EXCISION Left 09-02-2016    Sierra Krueger  @[facility]    HPI:  68 y.o. female presented to [facility] ED as a code stroke for aphasia and right side weakness. 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 Work-up pending. CT head negative for acute event.    Assessment / Plan / Recommendation  Clinical Impression  Sierra Krueger was sleepy but able to participate in preliminary swallowing assessment. No focal CN deficits. Bottom teeth present; upper dentures left at home. Oral care provided.  She accepted ice chips, sips of water and spoons of applesauce without difficulty. No s/s of aspiration nor dysphagia.  Deferred regular solid trials given lethargy. She followed commands and made basic needs known with no further evidence of aphasia. Recommend starting full liquid diet for tonight. SLP will f/u next date for diet advancement and speech/language evaluation. D/W RN. Pt preparing to transfer from ED to the floor. SLP Visit Diagnosis: Dysphagia, unspecified (R13.10)           Diet Recommendation   Other (Comment) (full liquids)  Medication Administration: Whole meds with liquid    Other  Recommendations Oral Care Recommendations: Oral care BID    Recommendations for follow up therapy are one component of a multi-disciplinary discharge planning process, led by the attending physician.  Recommendations may be updated based on patient status, additional functional criteria and insurance authorization.  Follow up Recommendations Other (comment) (tba)      Assistance Recommended  at Discharge    Functional Status Assessment Patient has had a recent decline in their functional status and demonstrates the ability to make significant improvements in function in a reasonable and predictable amount of time.  Frequency and Duration min 2x/week  1 week       Prognosis Prognosis for improved  oropharyngeal function: Good      Swallow Study   General Date of Onset: 04/28/23 HPI: 68 y.o. female presented to [facility] ED as a code stroke for aphasia and right side weakness. 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 Work-up pending. CT head negative for acute event. Type of Study: Bedside Swallow Evaluation Previous Swallow Assessment: no Diet Prior to this Study: NPO Temperature Spikes Noted: No Respiratory Status: Nasal cannula History of Recent Intubation: No Behavior/Cognition: Cooperative Oral Cavity Assessment: Within Functional Limits Oral Care Completed by SLP: Yes Oral Cavity - Dentition: Missing dentition (top dentures at home) Self-Feeding Abilities: Total assist Patient Positioning: Upright in bed Baseline Vocal Quality: Normal Volitional Cough: Weak Volitional Swallow: Able to elicit    Oral/Motor/Sensory Function Overall Oral Motor/Sensory Function: Within functional limits   Ice Chips Ice chips: Within functional limits   Thin Liquid Thin Liquid: Within functional limits    Nectar Thick Nectar Thick Liquid: Not tested   Honey Thick Honey Thick Liquid: Not tested   Puree Puree: Within functional limits   Solid     Solid: Not tested      Sierra Krueger 04/28/2023,3:34 PM  Sierra L. Vona, MA CCC/SLP Clinical Specialist - Acute Care SLP Acute Rehabilitation Services Office number [phone]

## 2023-04-28 NOTE — ED Notes (Signed)
 Family at bedside.

## 2023-04-28 NOTE — ED Notes (Signed)
 CCMD notified via telephone.

## 2023-04-28 NOTE — ED Triage Notes (Signed)
 Patient arrives from home as Code stroke, LKW 0100 today, at which time she fell out of bed and was put back into bed by family who states she was acting normally at that time. Family attempted to wake her up this morning and she was minimally responsive with R sided weakness and aphasia, so code stroke was activated by EMS. SpO2 80% on room air, so she was placed on NRB and given albuterol.

## 2023-04-29 ENCOUNTER — Observation Stay (HOSPITAL_COMMUNITY): Payer: 59

## 2023-04-29 ENCOUNTER — Inpatient Hospital Stay (HOSPITAL_COMMUNITY): Payer: 59

## 2023-04-29 DIAGNOSIS — K2970 Gastritis, unspecified, without bleeding: Secondary | ICD-10-CM | POA: Diagnosis not present

## 2023-04-29 DIAGNOSIS — I10 Essential (primary) hypertension: Secondary | ICD-10-CM | POA: Diagnosis not present

## 2023-04-29 DIAGNOSIS — R400 Somnolence: Secondary | ICD-10-CM | POA: Diagnosis present

## 2023-04-29 DIAGNOSIS — Z91013 Allergy to seafood: Secondary | ICD-10-CM | POA: Diagnosis not present

## 2023-04-29 DIAGNOSIS — K219 Gastro-esophageal reflux disease without esophagitis: Secondary | ICD-10-CM | POA: Diagnosis present

## 2023-04-29 DIAGNOSIS — T402X1A Poisoning by other opioids, accidental (unintentional), initial encounter: Secondary | ICD-10-CM | POA: Diagnosis present

## 2023-04-29 DIAGNOSIS — T50905A Adverse effect of unspecified drugs, medicaments and biological substances, initial encounter: Secondary | ICD-10-CM | POA: Diagnosis not present

## 2023-04-29 DIAGNOSIS — Z87891 Personal history of nicotine dependence: Secondary | ICD-10-CM | POA: Diagnosis not present

## 2023-04-29 DIAGNOSIS — G928 Other toxic encephalopathy: Secondary | ICD-10-CM | POA: Diagnosis present

## 2023-04-29 DIAGNOSIS — I129 Hypertensive chronic kidney disease with stage 1 through stage 4 chronic kidney disease, or unspecified chronic kidney disease: Secondary | ICD-10-CM | POA: Diagnosis present

## 2023-04-29 DIAGNOSIS — J9691 Respiratory failure, unspecified with hypoxia: Secondary | ICD-10-CM | POA: Diagnosis present

## 2023-04-29 DIAGNOSIS — D649 Anemia, unspecified: Secondary | ICD-10-CM | POA: Diagnosis not present

## 2023-04-29 DIAGNOSIS — R4701 Aphasia: Secondary | ICD-10-CM | POA: Diagnosis present

## 2023-04-29 DIAGNOSIS — T48201A Poisoning by unspecified drugs acting on muscles, accidental (unintentional), initial encounter: Secondary | ICD-10-CM | POA: Diagnosis present

## 2023-04-29 DIAGNOSIS — E041 Nontoxic single thyroid nodule: Secondary | ICD-10-CM | POA: Diagnosis present

## 2023-04-29 DIAGNOSIS — D509 Iron deficiency anemia, unspecified: Secondary | ICD-10-CM | POA: Diagnosis present

## 2023-04-29 DIAGNOSIS — E876 Hypokalemia: Secondary | ICD-10-CM | POA: Diagnosis present

## 2023-04-29 DIAGNOSIS — R7303 Prediabetes: Secondary | ICD-10-CM | POA: Diagnosis present

## 2023-04-29 DIAGNOSIS — J9601 Acute respiratory failure with hypoxia: Secondary | ICD-10-CM | POA: Diagnosis present

## 2023-04-29 DIAGNOSIS — G90522 Complex regional pain syndrome I of left lower limb: Secondary | ICD-10-CM | POA: Diagnosis present

## 2023-04-29 DIAGNOSIS — I69354 Hemiplegia and hemiparesis following cerebral infarction affecting left non-dominant side: Secondary | ICD-10-CM | POA: Diagnosis not present

## 2023-04-29 DIAGNOSIS — G894 Chronic pain syndrome: Secondary | ICD-10-CM | POA: Diagnosis present

## 2023-04-29 DIAGNOSIS — F1120 Opioid dependence, uncomplicated: Secondary | ICD-10-CM | POA: Diagnosis present

## 2023-04-29 DIAGNOSIS — R569 Unspecified convulsions: Secondary | ICD-10-CM

## 2023-04-29 DIAGNOSIS — G9340 Encephalopathy, unspecified: Secondary | ICD-10-CM | POA: Diagnosis not present

## 2023-04-29 DIAGNOSIS — Z885 Allergy status to narcotic agent status: Secondary | ICD-10-CM | POA: Diagnosis not present

## 2023-04-29 DIAGNOSIS — Z79899 Other long term (current) drug therapy: Secondary | ICD-10-CM | POA: Diagnosis not present

## 2023-04-29 DIAGNOSIS — K2950 Unspecified chronic gastritis without bleeding: Secondary | ICD-10-CM | POA: Diagnosis present

## 2023-04-29 DIAGNOSIS — N1831 Chronic kidney disease, stage 3a: Secondary | ICD-10-CM | POA: Diagnosis present

## 2023-04-29 DIAGNOSIS — F419 Anxiety disorder, unspecified: Secondary | ICD-10-CM | POA: Diagnosis present

## 2023-04-29 DIAGNOSIS — K449 Diaphragmatic hernia without obstruction or gangrene: Secondary | ICD-10-CM | POA: Diagnosis present

## 2023-04-29 DIAGNOSIS — Z880 Allergy status to penicillin: Secondary | ICD-10-CM | POA: Diagnosis not present

## 2023-04-29 LAB — CBC
HCT: 29.3 % — ABNORMAL LOW (ref 36.0–46.0)
Hemoglobin: 9.2 g/dL — ABNORMAL LOW (ref 12.0–15.0)
MCH: 23.4 pg — ABNORMAL LOW (ref 26.0–34.0)
MCHC: 31.4 g/dL (ref 30.0–36.0)
MCV: 74.4 fL — ABNORMAL LOW (ref 80.0–100.0)
Platelets: 217 10*3/uL (ref 150–400)
RBC: 3.94 MIL/uL (ref 3.87–5.11)
RDW: 22.7 % — ABNORMAL HIGH (ref 11.5–15.5)
WBC: 13.9 10*3/uL — ABNORMAL HIGH (ref 4.0–10.5)
nRBC: 0 % (ref 0.0–0.2)

## 2023-04-29 LAB — BASIC METABOLIC PANEL
Anion gap: 10 (ref 5–15)
BUN: 8 mg/dL (ref 8–23)
CO2: 22 mmol/L (ref 22–32)
Calcium: 8.7 mg/dL — ABNORMAL LOW (ref 8.9–10.3)
Chloride: 109 mmol/L (ref 98–111)
Creatinine, Ser: 0.93 mg/dL (ref 0.44–1.00)
GFR, Estimated: >60 mL/min (ref >60)
Glucose, Bld: 112 mg/dL — ABNORMAL HIGH (ref 70–99)
Potassium: 3.8 mmol/L (ref 3.5–5.1)
Sodium: 141 mmol/L (ref 135–145)

## 2023-04-29 LAB — BPAM RBC
Blood Product Expiration Date: 202501262359
Blood Product Expiration Date: 202501272359
ISSUE DATE / TIME: 202501021320
ISSUE DATE / TIME: 202501021620
Unit Type and Rh: 6200
Unit Type and Rh: 6200

## 2023-04-29 LAB — TYPE AND SCREEN
ABO/RH(D): A POS
Antibody Screen: NEGATIVE
Unit division: 0
Unit division: 0

## 2023-04-29 LAB — HIV ANTIBODY (ROUTINE TESTING W REFLEX): HIV Screen 4th Generation wRfx: NONREACTIVE

## 2023-04-29 MED ORDER — POTASSIUM CHLORIDE 20 MEQ PO PACK
40.0000 meq | PACK | Freq: Once | ORAL | Status: AC
  Administered 2023-04-29: 40 meq via ORAL
  Filled 2023-04-29: qty 2

## 2023-04-29 MED ORDER — LORAZEPAM 2 MG/ML IJ SOLN
0.5000 mg | Freq: Once | INTRAMUSCULAR | Status: AC | PRN
  Administered 2023-04-29: 0.5 mg via INTRAVENOUS
  Filled 2023-04-29: qty 1

## 2023-04-29 NOTE — Evaluation (Signed)
 Occupational Therapy Evaluation Patient Details Name: Sierra Krueger MRN: [id number] DOB: Apr 25, 1956 Today's Date: 04/29/2023   History of Present Illness 68 yo female presented to ED on 05/08/2023 with R sided weakness and aphasia. MRI negative for acute stroke. Likely recrudescence of prior stroke symptoms due to significant anemia and sedation due to polypharmacy. PMH - HTN, rt CVA with residual lt weakness, back surgery, ckd, PE, parathyroidectomy.   Clinical Impression   Pt very limited in OT eval, could be due to medications but pt was very lethargic and barely had eyes open with therapy. She was minimally responsive, partially moving BUEs, and needing significantly more assist for mobility and ADLs. Notified RN of pt presentation as she was far more engaged with PT. OT to continue following pt acutely to address listed deficits and help transition to next level of care. Patient would benefit from post acute skilled rehab facility with <3 hours of therapy and 24/7 support        If plan is discharge home, recommend the following: A lot of help with walking and/or transfers;A lot of help with bathing/dressing/bathroom;Two people to help with walking and/or transfers;Assistance with cooking/housework    Functional Status Assessment  Patient has had a recent decline in their functional status and demonstrates the ability to make significant improvements in function in a reasonable and predictable amount of time.  Equipment Recommendations  None recommended by OT    Recommendations for Other Services       Precautions / Restrictions Precautions Precautions: Fall Restrictions Weight Bearing Restrictions Per Provider Order: No      Mobility Bed Mobility Overal bed mobility: Needs Assistance Bed Mobility: Sit to Supine       Sit to supine: Total assist   General bed mobility comments: Pt with no initiation    Transfers Overall transfer level: Needs assistance Equipment used:  None Transfers: Sit to/from Stand, Bed to chair/wheelchair/BSC Sit to Stand: Total assist Stand pivot transfers: Total assist                Balance Overall balance assessment: Needs assistance Sitting-balance support: No upper extremity supported, Feet supported Sitting balance-Leahy Scale: Fair                                     ADL either performed or assessed with clinical judgement   ADL Overall ADL's : Needs assistance/impaired Eating/Feeding: Sitting;Maximal assistance   Grooming: Maximal assistance   Upper Body Bathing: Maximal assistance   Lower Body Bathing: Maximal assistance   Upper Body Dressing : Maximal assistance   Lower Body Dressing: Total assistance   Toilet Transfer: Total assistance;Stand-pivot                   Vision   Additional Comments: Not able to assess due to impaired cognition     Perception         Praxis         Pertinent Vitals/Pain Pain Assessment Pain Assessment: Faces Faces Pain Scale: No hurt     Extremity/Trunk Assessment Upper Extremity Assessment Upper Extremity Assessment: Difficult to assess due to impaired cognition;RUE deficits/detail;LUE deficits/detail RUE Deficits / Details: RUE seemingly grossly flaccid, pt able to speak OT hand but very weak grasp RUE Coordination: decreased fine motor;decreased gross motor LUE Deficits / Details: Able to partially raise against gravity, 2/5 from what it seems. grasp weak but better than R LUE Coordination:  decreased fine motor;decreased gross motor   Lower Extremity Assessment Lower Extremity Assessment: Generalized weakness       Communication     Cognition Arousal: Obtunded, Suspect due to medications Behavior During Therapy: Flat affect Overall Cognitive Status: Difficult to assess                                 General Comments: Pt alerts to name and constant stimuli, eyes closed throughout majority of session but pt  giving minimal responses. When prompted she stated I am not having a stroke in a mumbled voice     General Comments  VSS on 2L    Exercises     Shoulder Instructions      Home Living Family/patient expects to be discharged to:: Private residence Living Arrangements: Children Available Help at Discharge: Family;Available 24 hours/day;Personal care attendant (aide 4x/week for 3hr) Type of Home: Apartment Home Access: Level entry     Home Layout: One level     Bathroom Shower/Tub: Tub/shower unit     Bathroom Accessibility: No   Home Equipment: Rollator (4 wheels);Cane - single point;Shower seat   Additional Comments: Pt very lethargic and providing bare minimal responses, home setup obtained from PT eval      Prior Functioning/Environment Prior Level of Function : Needs assist               ADLs Comments: Reports aide helps with bathing, sometimes dressing. She does a bird bath on the days the aide is not there.        OT Problem List: Decreased strength;Decreased cognition;Impaired balance (sitting and/or standing);Impaired UE functional use;Decreased coordination      OT Treatment/Interventions: Self-care/ADL training;Therapeutic exercise;Patient/family education;Therapeutic activities;Balance training    OT Goals(Current goals can be found in the care plan section) Acute Rehab OT Goals OT Goal Formulation: Patient unable to participate in goal setting Time For Goal Achievement: 05/13/23 Potential to Achieve Goals: Fair ADL Goals Pt Will Perform Grooming: with set-up;sitting Pt Will Perform Upper Body Dressing: with set-up;sitting Pt Will Transfer to Toilet: with min assist;bedside commode;stand pivot transfer Pt/caregiver will Perform Home Exercise Program: Increased ROM;Increased strength;Both right and left upper extremity;With theraputty;With theraband;With written HEP provided Additional ADL Goal #1: Pt will follow 90% of commands during functional  activites to demonstrate improved attention span and processing  OT Frequency: Min 1X/week    Co-evaluation              AM-PAC OT 6 Clicks Daily Activity     Outcome Measure Help from another person eating meals?: A Lot Help from another person taking care of personal grooming?: A Lot Help from another person toileting, which includes using toliet, bedpan, or urinal?: Total Help from another person bathing (including washing, rinsing, drying)?: A Lot Help from another person to put on and taking off regular upper body clothing?: A Lot Help from another person to put on and taking off regular lower body clothing?: A Lot 6 Click Score: 11   End of Session Equipment Utilized During Treatment: Gait belt Nurse Communication: Mobility status (a lot more assist needed, could be due to meds?)  Activity Tolerance: Patient limited by lethargy;Patient limited by fatigue Patient left: in bed;with call bell/phone within reach;with bed alarm set  OT Visit Diagnosis: Unsteadiness on feet (R26.81);Other abnormalities of gait and mobility (R26.89);Muscle weakness (generalized) (M62.81);Other symptoms and signs involving the nervous system (R29.898)  Time: 8871-8852 OT Time Calculation (min): 19 min Charges:  OT General Charges $OT Visit: 1 Visit OT Evaluation $OT Eval Moderate Complexity: 1 Mod  04/29/2023  AB, OTR/L  Acute Rehabilitation Services  Office: [phone]   Curtistine JONETTA Das 04/29/2023, 2:02 PM

## 2023-04-29 NOTE — Hospital Course (Addendum)
 Acute on chronic microcytic anemia Iron deficiency She presented with acute on chronic anemia with a hemoglobin of 5.7.  She was given 2 units of packed red blood cells. On presentation, she was somnolent and unable to give a great history, however when her somnolence improved she denied any recent hematochezia, melena, hematemesis, or increased vaginal bleeding.  GI was consulted who did an EGD.  On EGD, they found a healing gastric ulcer and diffuse erythema, but no active source of bleeding. They biopsied the ulcer. They also noted a hiatal hernia. During her hospitalization, she was also found to be iron deficient with a ferritin of 11, TSAT of 4, and iron of 14.  She was discharged with outpatient iron repletion and will need to be follows up outpatient.  Somnolence, secondary to polypharmacy She presented with extreme somnolence and was unable to participate in normal conversation.  An encephalopathic workup was done, which was unremarkable. Her somnolence was thought to be due to polypharmacy, given her plethora of centrally acting medications including chlorpromazine , hydroxyzine , pregabalin , oxycodone , and lamotrigine . It appears that these are being prescribed by her psychiatrist, Dr. Jerrell Forehand.  She states that these are being prescribed for her sleep and denies any prior seizure history or psychiatric disorders.  Per chart review, it looks like she has been on these medications for several years.  We held all of these medications except the lamotrigine  and her somnolence improved dramatically.   Hypokalemia Hypomagnesemia She had low potassium and magnesium  on presentation, which were both repleted over the course of her hospitalization. Her potassium remained low despite repletion, may benefit from further workup.

## 2023-04-29 NOTE — Procedures (Signed)
 Patient Name: Sierra Krueger  MRN: [id number]  Epilepsy Attending: Arlin MALVA Krebs  Referring Physician/Provider: Norrine Sharper, MD  Date: 04/28/2022 Duration: 26.45 mins  Patient history: 68yo F with ams getting eeg to evaluate for seizure  Level of alertness: Awake, asleep  AEDs during EEG study: LTG, PGB  Technical aspects: This EEG study was done with scalp electrodes positioned according to the 10-20 International system of electrode placement. Electrical activity was reviewed with band pass filter of 1-70Hz , sensitivity of 7 uV/mm, display speed of 68mm/sec with a 60Hz  notched filter applied as appropriate. EEG data were recorded continuously and digitally stored.  Video monitoring was available and reviewed as appropriate.  Description: The posterior dominant rhythm consists of 8 Hz activity of moderate voltage (25-35 uV) seen predominantly in posterior head regions, symmetric and reactive to eye opening and eye closing. Sleep was characterized by vertex waves, sleep spindles (12 to 14 Hz), maximal frontocentral region. EEG showed intermittent generalized 3 to 6 Hz theta-delta slowing. Physiologic photic driving was not seen during photic stimulation.  Hyperventilation was not performed.     ABNORMALITY - Intermittent slow, generalized  IMPRESSION: This study is suggestive of mild diffuse encephalopathy. No seizures or epileptiform discharges were seen throughout the recording.  Kiira Brach O [place]

## 2023-04-29 NOTE — Progress Notes (Signed)
 HD#0 SUBJECTIVE:  Patient Summary: Sierra Krueger is a 68 y.o. female with a pertinent PMH of CVA w/ right-sided deficits, microcytic anemia, chronic pain, complex regional pain syndrome, and HTN who presented with aphasia and right-sided weakness and is admitted for stroke rule out and acute microcytic anemia.   Overnight Events: MRI brain, with ativan  for anxiety.   Interim History: Patient evaluated at bedside. She is still somnolent but oriented and responds appropriately. No acute concerns. States she has not noted any bleeding or dark stools.   OBJECTIVE:  Vital Signs: Vitals:   04/29/23 0037 04/29/23 0442 04/29/23 0726 04/29/23 1134  BP: 132/72 131/71 (!) 147/80 128/74  Pulse: (!) 112 (!) 113 (!) 116 94  Resp: 16 16 20 20   Temp: 98.9 F (37.2 C) 98.8 F (37.1 C) 98.4 F (36.9 C) 99.1 F (37.3 C)  TempSrc:  Oral Oral Oral  SpO2: 100% 100% 100% 100%  Weight:       Supplemental O2: Nasal Cannula SpO2: 100 % O2 Flow Rate (L/min): 2.5 L/min  Filed Weights   04/28/23 1000  Weight: 74.4 kg    Intake/Output Summary (Last 24 hours) at 04/29/2023 1411 Last data filed at 04/28/2023 1600 Gross per 24 hour  Intake 298 ml  Output --  Net 298 ml   Net IO Since Admission: 298 mL [04/29/23 1411]  Physical Exam: General: sitting in chair, very somnolent Cardiac: tachycardic, regular rhythm, no murmurs Pulmonary: CTA bilaterally, normal effort and rate Abdomen: soft, non-tender, non-distended Neuro: No focal deficits. Very somnolent, but oriented when aroused.   Patient Lines/Drains/Airways Status     Active Line/Drains/Airways     Name Placement date Placement time Site Days   Peripheral IV 04/28/23 18 G Right Antecubital 04/28/23  0956  Antecubital  1   Peripheral IV 04/28/23 18 G Left Antecubital 04/28/23  1037  Antecubital  1   Ureteral Drain/Stent Left ureter 6 Fr. 05/15/18  0926  Left ureter  1810   Ureteral Drain/Stent Right ureter 6 Fr. 05/15/18  0922  Right ureter   1810            Pertinent Labs:    Latest Ref Rng & Units 04/29/2023    8:15 AM 04/28/2023    8:28 PM 04/28/2023   10:04 AM  CBC  WBC 4.0 - 10.5 K/uL 13.9     Hemoglobin 12.0 - 15.0 g/dL 9.2  9.2  6.5   Hematocrit 36.0 - 46.0 % 29.3  29.1  19.0   Platelets 150 - 400 K/uL 217          Latest Ref Rng & Units 04/29/2023    8:15 AM 04/28/2023   10:04 AM 04/28/2023   10:03 AM  CMP  Glucose 70 - 99 mg/dL 887  853  853   BUN 8 - 23 mg/dL 8  13  13    Creatinine 0.44 - 1.00 mg/dL 9.06  8.79  8.81   Sodium 135 - 145 mmol/L 141  138  138   Potassium 3.5 - 5.1 mmol/L 3.8  3.3  3.3   Chloride 98 - 111 mmol/L 109  105  107   CO2 22 - 32 mmol/L 22   21   Calcium  8.9 - 10.3 mg/dL 8.7   8.1   Total Protein 6.5 - 8.1 g/dL   5.7   Total Bilirubin 0.0 - 1.2 mg/dL   0.4   Alkaline Phos 38 - 126 U/L   52   AST 15 -  41 U/L   14   ALT 0 - 44 U/L   8     Recent Labs    04/28/23 0958  GLUCAP 125*     Pertinent Imaging: MR BRAIN WO CONTRAST Result Date: 04/29/2023 CLINICAL DATA:  Neuro deficit with acute stroke suspected EXAM: MRI HEAD WITHOUT CONTRAST TECHNIQUE: Multiplanar, multiecho pulse sequences of the brain and surrounding structures were obtained without intravenous contrast. COMPARISON:  Head CT and CTA from yesterday FINDINGS: Brain: No acute infarction, hemorrhage, hydrocephalus, extra-axial collection or mass lesion. Large chronic infarct in the right occipital lobe with dense encephalomalacia. Small chronic infarcts in the bilateral cerebellum. Vascular: The major flow voids are preserved Skull and upper cervical spine: Cervical spine degeneration with C3-4 anterolisthesis. No focal marrow lesion. Sinuses/Orbits: No acute finding. IMPRESSION: 1. No acute finding. 2. 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 Electronically Signed   By: Dorn Roulette M.D.   On: 04/29/2023 04:35    ASSESSMENT/PLAN:  Assessment: Principal Problem:   Acute on chronic  anemia Active Problems:   Essential hypertension   Encephalopathy   Chronic pain syndrome   Hypokalemia   Thyroid  nodule   Hypoxic respiratory failure (HCC)   Somnolence  PMH of CVA w/ right-sided deficits, microcytic anemia, chronic pain, complex regional pain syndrome, and HTN who presented with aphasia and right-sided weakness and is admitted for stroke rule out and acute microcytic anemia on hospital day 1.   Plan: Stroke ruled out History of previous cerebral infarction with left sided deficits CT head, CTA and MRI brain did not demonstrate any new infarcts. Most likely recrudescence in the setting of acute anemia of deficits from her prior stroke. Unable to assess motor function given somnolence, but oriented and answers questions appropriately when aroused. She did receive ativan  for the MRI, which may be contributing to her somnolence.  - PT/OT evals - Full liquid diet; SLP following - Continue Lyrica  and oxycodone ; holding hydroxyzine  and tizanidine  in the setting of somnolence  Acute encephalopathy Patient remains quite somnolent. She is more alert than yesterday and is oriented to self and place. The ativan  she received for her MRI may also be contributing to her somnolence. We spoke with her son, who described an episode of shaking, eyes rolling back, and post-episode confusion, concerning for a possible seizure, so we will order an EEG and consider other possible etiologies of her somnolence/encephalopathy if it does not improve.  - EEG ordered  Acute on chronic microcytic anemia Hemoglobin 5.7 --> 6.5 --> 9.2 --> 9.2, s/p 2 units of packed red blood cells.  Low MCV, Hemoccult positive in the ED.  Source of bleeding not yet identified but most likely GI.  Patient denies any hematochezia/melena. Does have iron deficiency and may need repletion once source of bleeding is identified.  - GI consulted - Holding aspirin  Chronic conditions: Chronic pain/Complex regional pain  syndrome: Patient is on oxycodone , pregabalin , and tizanidine  for pain management outpatient.  We are holding tizanidine  in the setting of her somnolence. Psychiatric disorder: Patient is taking Lamictal  300 mg daily, hydroxyzine  as needed, and chlorpromazine  100 mg at bedtime outpatient.  Hydroxyzine  is held in the setting of somnolence. HTN: Resumed home bisoprolol  10-6.25 mg daily Hyperparathyroidism s/p parathyroidectomy (2021)  Best Practice: Diet: Clear liquid diet, advance as tolerated IVF: Fluids: None VTE: SCDs Start: 04/28/23 1341 Code: Full AB: None Therapy Recs: Pending DISPO: Anticipated discharge in 2-4 days pending resolution of bleeding and PT/OT evals.  Signature:  Ozell Riff, MD  Internal Medicine Resident, PGY-1 [facility]  Pager: [phone] 2:11 PM, 04/29/2023   Please contact the on call pager after 5 pm and on weekends at [phone].

## 2023-04-29 NOTE — Evaluation (Signed)
 Physical Therapy Evaluation Patient Details Name: Sierra Krueger MRN: [id number] DOB: 05-Oct-1955 Today's Date: 04/29/2023  History of Present Illness  68 yo female presented to ED on 05/08/2023 with R sided weakness and aphasia. MRI negative for acute stroke. Likely recrudescence of prior stroke symptoms due to significant anemia and sedation due to polypharmacy. PMH - HTN, rt CVA with residual lt weakness, back surgery, ckd, PE, parathyroidectomy.    Clinical Impression  Pt admitted with above diagnosis. PTA she ambulates with a 4 wheeled walker independently, but needs assist with bathing/dressing. States an aide assists 4x/week up to 3 hours per visit. Son does not work but volunteers and can provide 24/7 assist if needed. She would like to return home at d/c. She is oriented x4 this morning but lethargic and delayed. She required up to mod assist for bed mobility due to back pain primarily. Min assist for transfers from various surfaces and short distance gait in room. I anticipate her function to improve as her medical status enhances. As long as pt and son feel confident they can provide adequate care, HHPT would be appropriate at d/c. If she fails to progress rapidly, as expected, or family cannot provide 24/7 assist, it may be best to consider SNF for short term rehab. I will plan to follow-up tomorrow and make sure there has been good progress made. Requested mobility techs to work with pt to increase activity level during admission. Pt currently with functional limitations due to the deficits listed below (see PT Problem List). Pt will benefit from acute skilled PT to increase their independence and safety with mobility to allow discharge.    HR 117 at rest, 130s with activity. SpO2 100% on 4L, 98% on RA.          If plan is discharge home, recommend the following: A little help with walking and/or transfers;A little help with bathing/dressing/bathroom;Assistance with cooking/housework;Direct  supervision/assist for medications management;Assist for transportation   Can travel by private vehicle        Equipment Recommendations Rolling walker (2 wheels)  Recommendations for Other Services       Functional Status Assessment Patient has had a recent decline in their functional status and demonstrates the ability to make significant improvements in function in a reasonable and predictable amount of time.     Precautions / Restrictions Precautions Precautions: Fall Restrictions Weight Bearing Restrictions Per Provider Order: No      Mobility  Bed Mobility Overal bed mobility: Needs Assistance Bed Mobility: Rolling, Sidelying to Sit Rolling: Mod assist Sidelying to sit: Min assist       General bed mobility comments: Mod assist to roll, complains of back pain, cues for log roll techniques, min assist for trunk support to rise to EOB.    Transfers Overall transfer level: Needs assistance Equipment used: Rolling walker (2 wheels) Transfers: Sit to/from Stand Sit to Stand: Min assist           General transfer comment: Min assist for boost to stand from bed x2 and from [facility]. Pt has tendency to lean backwards. Responds well to cues for anterior weight shift nose over toes.    Ambulation/Gait Ambulation/Gait assistance: Min assist Gait Distance (Feet): 15 Feet Assistive device: Rolling walker (2 wheels) Gait Pattern/deviations: Step-through pattern, Decreased stride length, Wide base of support, Leaning posteriorly Gait velocity: decr Gait velocity interpretation: <1.31 ft/sec, indicative of household ambulator   General Gait Details: Min assist for RW control, cues to open eyes. Leaning posteriorly at  times. No buckling but moderately unsteady.  Stairs            Wheelchair Mobility     Tilt Bed    Modified Rankin (Stroke Patients Only)       Balance Overall balance assessment: Needs assistance Sitting-balance support: No upper extremity  supported, Feet supported Sitting balance-Leahy Scale: Fair Sitting balance - Comments: progressed from min A to CGA Postural control: Posterior lean Standing balance support: Bilateral upper extremity supported, During functional activity, Reliant on assistive device for balance Standing balance-Leahy Scale: Poor                               Pertinent Vitals/Pain Pain Assessment Pain Assessment: Faces Faces Pain Scale: Hurts even more Pain Location: back Pain Descriptors / Indicators: Aching Pain Intervention(s): Monitored during session, Repositioned, Patient requesting pain meds-RN notified    Home Living Family/patient expects to be discharged to:: Private residence Living Arrangements: Children Available Help at Discharge: Family;Available 24 hours/day;Personal care attendant (aide 4x/week for 3hr) Type of Home: Apartment Home Access: Level entry       Home Layout: One level Home Equipment: Rollator (4 wheels);Cane - single point;Shower seat      Prior Function Prior Level of Function : Needs assist             Mobility Comments: States she could ambulate without assistance, using her 4WW, only fall in the past 6 months was yesterday prior to admission ADLs Comments: Reports aide helps with bathing, sometimes dressing. She does a bird bath on the days the aide is not there.     Extremity/Trunk Assessment        Lower Extremity Assessment Lower Extremity Assessment: Generalized weakness (low effort due to back pain and lethargy)       Communication   Communication Communication: Difficulty communicating thoughts/reduced clarity of speech (mumbles, suspect due to lethargy)  Cognition Arousal: Lethargic Behavior During Therapy: Flat affect, Anxious Overall Cognitive Status: Within Functional Limits for tasks assessed                                 General Comments: Oriented x4, lethargic, needs cues to open her eyes.         General Comments General comments (skin integrity, edema, etc.): HR 117 at rest, 130s with activity. SpO2 100% on 4L, 98% on RA.    Exercises     Assessment/Plan    PT Assessment Patient needs continued PT services  PT Problem List Decreased range of motion;Decreased strength;Decreased activity tolerance;Decreased balance;Decreased mobility;Decreased cognition;Decreased knowledge of use of DME;Decreased safety awareness;Decreased knowledge of precautions;Cardiopulmonary status limiting activity;Pain       PT Treatment Interventions DME instruction;Gait training;Functional mobility training;Therapeutic activities;Balance training;Therapeutic exercise;Neuromuscular re-education;Patient/family education;Modalities    PT Goals (Current goals can be found in the Care Plan section)  Acute Rehab PT Goals Patient Stated Goal: Go home PT Goal Formulation: With patient Time For Goal Achievement: 05/13/23 Potential to Achieve Goals: Good    Frequency Min 1X/week     Co-evaluation               AM-PAC PT 6 Clicks Mobility  Outcome Measure Help needed turning from your back to your side while in a flat bed without using bedrails?: A Lot Help needed moving from lying on your back to sitting on the side of a flat bed  without using bedrails?: A Little Help needed moving to and from a bed to a chair (including a wheelchair)?: A Little Help needed standing up from a chair using your arms (e.g., wheelchair or bedside chair)?: A Little Help needed to walk in hospital room?: A Little Help needed climbing 3-5 steps with a railing? : A Lot 6 Click Score: 16    End of Session Equipment Utilized During Treatment: Gait belt Activity Tolerance: Patient tolerated treatment well Patient left: in chair;with call bell/phone within reach;with chair alarm set Nurse Communication: Mobility status;Patient requests pain meds PT Visit Diagnosis: Unsteadiness on feet (R26.81);Other abnormalities of  gait and mobility (R26.89);Muscle weakness (generalized) (M62.81);History of falling (Z91.81);Other symptoms and signs involving the nervous system (R29.898);Pain Pain - part of body:  (back)    Time: 9140-9075 PT Time Calculation (min) (ACUTE ONLY): 25 min   Charges:   PT Evaluation $PT Eval Low Complexity: 1 Low PT Treatments $Therapeutic Activity: 23-37 mins PT General Charges $$ ACUTE PT VISIT: 1 Visit         [name], PT, DPT [facility]  Rehabilitation Services Physical Therapist Office: [phone] Website: [email or web address]   [name] 04/29/2023, 9:45 AM

## 2023-04-29 NOTE — Evaluation (Signed)
 Speech Language Pathology Evaluation Patient Details Name: Sierra Krueger MRN: [id number] DOB: 08/21/1955 Today's Date: 04/29/2023 Time: 8449-8441 SLP Time Calculation (min) (ACUTE ONLY): 8 min  Problem List:  Patient Active Problem List   Diagnosis Date Noted   Encephalopathy 04/29/2023   Chronic pain syndrome 04/29/2023   Hypokalemia 04/29/2023   Thyroid  nodule 04/29/2023   Hypoxic respiratory failure (HCC) 04/29/2023   Somnolence 04/29/2023   Acute on chronic anemia 04/28/2023   Acute pulmonary embolism (HCC) 11/22/2021   Anxiety 11/22/2021   Leiomyoma of uterus 11/22/2021   Essential hypertension 11/22/2021   GERD without esophagitis 11/22/2021   Pulmonary emboli (HCC) 11/22/2021   Primary hyperparathyroidism (HCC) 06/22/2019   Hyperparathyroidism, primary (HCC) 06/20/2019   Renal calculus, left 04/24/2018   Pain in left hip 08/11/2017   Trochanteric bursitis, left hip 05/18/2016   Past Medical History:  Past Medical History:  Diagnosis Date   Ambulates with cane    Anxiety    CKD (chronic kidney disease), stage II    Decreased peripheral vision of both eyes    left side due to CVA 2007   History of CVA with residual deficit 12/2005   large right PCA territory subacute infarct without hemorrhage w/ left sided upper/lower extremity weakness and left peripheral vision loss   Hypertension    Left-sided weakness    upper/ lower extremity,  CVA residual 2007   Renal calculus, bilateral    Stroke (HCC) 2007   lt side weakness.   Wears partial dentures    upper   Past Surgical History:  Past Surgical History:  Procedure Laterality Date   BREAST LUMPECTOMY Left 1997   benign tumor    CYSTOSCOPY/URETEROSCOPY/HOLMIUM LASER/STENT PLACEMENT Bilateral 05/15/2018   Procedure: CYSTOSCOPY/RETROGRADE/URETEROSCOPY/HOLMIUM LASER/BASKET STONE EXTRACTION,  LEFT STENT REPLACEMENT, RIGHT STENT PLACEMENT;  Surgeon: Ottelin, Mark, MD;  Location: [facility] [place];  Service:  Urology;  Laterality: Bilateral;   FOOT SURGERY Left 2010   HOLMIUM LASER APPLICATION Bilateral 05/15/2018   Procedure: HOLMIUM LASER APPLICATION;  Surgeon: Ottelin, Mark, MD;  Location: [facility];  Service: Urology;  Laterality: Bilateral;   IR URETERAL STENT LEFT NEW ACCESS W/O SEP NEPHROSTOMY CATH  04/24/2018   LUMBAR SPINE SURGERY  2002   L3 -- 5   NEPHROLITHOTOMY Left 04/24/2018   Procedure: NEPHROLITHOTOMY PERCUTANEOUS;  Surgeon: Ottelin, Mark, MD;  Location: [facility];  Service: Urology;  Laterality: Left;   PARATHYROIDECTOMY N/A 06/22/2019   Procedure: NECK EXPLORATION WITH PARATHYROIDECTOMY WITH PARATHYROID  BIOPSY;  Surgeon: Eletha Boas, MD;  Location: [facility];  Service: General;  Laterality: N/A;   TROCHANTERIC BURSA EXCISION Left 09-02-2016    dr vernetta  @[facility]    HPI:  68 y.o. female presented to [facility] ED as a code stroke for aphasia and right side weakness. 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 Work-up pending. CT head negative for acute event.   Assessment / Plan / Recommendation Clinical Impression  Pt presents with seemingly intact syntax and semantics, although is having increased difficulty with attention, speech intelligibility, auditory comprehension, and problem solving. Suspect lethargy is greatly affecting her presentation this date. She requires multimodal cueing to follow simple one-step commands. Pt benefits from increased wait time and prompting as initiation appears somewhat delayed. Pt unable to use call bell to alert nursing staff of her needs despite Max multimodal cueing. Her attention is fleeting and she requires multiple cues and redirection to attend to functional tasks. Suspect this greatly impacts  her performance with more structured cognitive tasks attempted today. Overall, feel that pt is experiencing acute cognitive differences that may benefit from ongoing skilled SLP services. Will continue following.    SLP  Assessment  SLP Recommendation/Assessment: Patient needs continued Speech Lanaguage Pathology Services SLP Visit Diagnosis: Dysarthria and anarthria (R47.1);Cognitive communication deficit (R41.841)    Recommendations for follow up therapy are one component of a multi-disciplinary discharge planning process, led by the attending physician.  Recommendations may be updated based on patient status, additional functional criteria and insurance authorization.    Follow Up Recommendations  Home health SLP    Assistance Recommended at Discharge  Frequent or constant Supervision/Assistance  Functional Status Assessment Patient has had a recent decline in their functional status and demonstrates the ability to make significant improvements in function in a reasonable and predictable amount of time.  Frequency and Duration min 2x/week  2 weeks      SLP Evaluation Cognition  Overall Cognitive Status: Impaired/Different from baseline Arousal/Alertness: Lethargic Orientation Level: Oriented X4 Attention: Sustained Sustained Attention: Impaired Sustained Attention Impairment: Verbal basic;Functional basic Awareness: Impaired Awareness Impairment: Intellectual impairment Problem Solving: Impaired Problem Solving Impairment: Functional basic;Verbal basic       Comprehension  Auditory Comprehension Overall Auditory Comprehension: Impaired Yes/No Questions: Impaired Basic Biographical Questions: 76-100% accurate Basic Immediate Environment Questions: 0-24% accurate Commands: Impaired One Step Basic Commands: 50-74% accurate    Expression Expression Primary Mode of Expression: Verbal Verbal Expression Overall Verbal Expression: Appears within functional limits for tasks assessed   Oral / Motor  Oral Motor/Sensory Function Overall Oral Motor/Sensory Function: Within functional limits Motor Speech Overall Motor Speech: Impaired Respiration: Within functional limits Phonation:  Normal Resonance: Within functional limits Articulation: Impaired Level of Impairment: Phrase Intelligibility: Intelligibility reduced Phrase: 50-74% accurate            Damien Blumenthal, M.A., CF-SLP Speech Language Pathology, Acute Rehabilitation Services  Secure Chat preferred [phone]  04/29/2023, 4:08 PM

## 2023-04-29 NOTE — Care Management Obs Status (Cosign Needed)
 MEDICARE OBSERVATION STATUS NOTIFICATION   Patient Details  Name: Sierra Krueger MRN: [id number] Date of Birth: [birth year]   Medicare Observation Status Notification Given:  Yes    Janae Bridgeman, RN 04/29/2023, 11:42 AM

## 2023-04-29 NOTE — Progress Notes (Signed)
 Transition of Care [facility]) - Inpatient Brief Assessment   Patient Details  Name: Sierra Krueger MRN: [id number] Date of Birth: [birth year]  [facility] CM/SW Contact:    Rosaline JONELLE Joe, RN Phone Number: 04/29/2023, 11:48 AM   Clinical Narrative: CM met with the patient at the bedside - no family present at this time and I was unable to reach the patient's daughter by phone.  Patient admitted to the hospital with right sided weakness and aphagia.    Patient was provided with Medicare Observation notice - given to the patient.  Patient sleepy after test this morning but able to slowly answer simple questions.  Patient states that she lives with her son and has nursing aide 4 time per week with through Medicaid/ [employer].  TOC Team will continue to follow the patient for TOC needs. PT/OT evaluation pending at this time.   Transition of Care Asessment: Insurance and Status: (P) Insurance coverage has been reviewed Patient has primary care physician: (P) No Home environment has been reviewed: (P) from home with family Prior level of function:: (P) family assistance/ PCS Services active at the home through [facility] Prior/Current Home Services: (P) Current home services (Active with [employer]) Social Drivers of Health Review: (P) SDOH reviewed needs interventions Readmission risk has been reviewed: (P) Yes Transition of care needs: (P) transition of care needs identified, TOC will continue to follow

## 2023-04-29 NOTE — Progress Notes (Signed)
 EEG complete - results pending

## 2023-04-29 NOTE — Progress Notes (Signed)
 Speech Language Pathology Treatment: Dysphagia  Patient Details Name: Sierra Krueger MRN: [id number] DOB: 16-Jul-1955 Today's Date: 04/29/2023 Time: 8457-8449 SLP Time Calculation (min) (ACUTE ONLY): 8 min  Assessment / Plan / Recommendation Clinical Impression  Pt lethargic but able to better participate given PO trials this date. Observed pt with thin liquids and purees without overt s/s of dysphagia or aspiration. She does require increased cueing to attend to presentations but overall oral transit is functional. Deferred trials of solids this date due to ongoing lethargy but do recommend upgrading diet to include Dys 1 texture solids and continuing thin liquids. POs should only be offered when pt is fully awake, alert, and accepting. Suspect pt will progress quickly with dysphagia goals as mentation improves. Will continue following.    HPI HPI: 68 y.o. female presented to [facility] ED as a code stroke for aphasia and right side weakness. 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 Work-up pending. CT head negative for acute event.      SLP Plan  Continue with current plan of care      Recommendations for follow up therapy are one component of a multi-disciplinary discharge planning process, led by the attending physician.  Recommendations may be updated based on patient status, additional functional criteria and insurance authorization.    Recommendations  Diet recommendations: Dysphagia 1 (puree);Thin liquid Liquids provided via: Cup;Straw Medication Administration: Whole meds with liquid Supervision: Staff to assist with self feeding;Full supervision/cueing for compensatory strategies Compensations: Minimize environmental distractions;Slow rate;Small sips/bites Postural Changes and/or Swallow Maneuvers: Seated upright 90 degrees                  Oral care BID   Frequent or constant Supervision/Assistance Dysphagia, unspecified (R13.10)     Continue with  current plan of care     Damien Blumenthal, M.A., CF-SLP Speech Language Pathology, Acute Rehabilitation Services  Secure Chat preferred [phone]   04/29/2023, 4:01 PM

## 2023-04-29 NOTE — Progress Notes (Signed)
 Patient agreed to proceed with MRI post Ativan administration per MD orders Tolerated without issues

## 2023-04-29 NOTE — Progress Notes (Signed)
 Transport attempted to take patient for MRI patient refused to go stating "I'm scared and I don't want to go" attempted to reassure patient but patient continued to refuse provider notified and to come talk to patient as well

## 2023-04-29 NOTE — Progress Notes (Signed)
 Brief Neuro Update note:  Reviewed MRI Brain and negative for acute stroke. I suspect that the noted generalized weakness yesterday along with significant worsening of baseline L sided weakness from prior stroke were likely recrudescence of prior stroke symptoms due to significant anemia and sedation due to polypharmacy.  No further inpatient neurology recommendations. We will signoff. Please feel free to contact us  with any questions or concerns.  Emorie Mcfate [facility]

## 2023-04-29 NOTE — Consult Note (Signed)
 Reason for Consult: Anemia and heme positive stool Referring Physician: Teaching Service  Sierra Krueger HPI: [name] with a PMH of CVA, HTN, CKD, and anxiety admitted for a code stroke.  The patient was immediately treated after presenting with a fall at home, which was associated with dysarthria and right-sided weakness.  During the work up she was identified to have a microcytic anemia as well as heme positive stool.  Her last routine colonoscopy was on 08/12/2022 for screening purposes.  A small 3 mm polyp was identified, but it was not retrieved.  She was recommended a 7 year follow up.  Today the patient is very lethargic and she is not able to answer any questions.  From the notes there were no reports of melena or hematochezia.  Her baseline HGB from last August was at 11.4 g/dL with an MCV of 13.4.  On admission her HGB was at 5.7 g/dL and an MCV of 29.1.  After two units of PRBC her HGB was at 9.2 g/dL.  Past Medical History:  Diagnosis Date   Ambulates with cane    Anxiety    CKD (chronic kidney disease), stage II    Decreased peripheral vision of both eyes    left side due to CVA 2007   History of CVA with residual deficit 12/2005   large right PCA territory subacute infarct without hemorrhage w/ left sided upper/lower extremity weakness and left peripheral vision loss   Hypertension    Left-sided weakness    upper/ lower extremity,  CVA residual 2007   Renal calculus, bilateral    Stroke (HCC) 2007   lt side weakness.   Wears partial dentures    upper    Past Surgical History:  Procedure Laterality Date   BREAST LUMPECTOMY Left 1997   benign tumor    CYSTOSCOPY/URETEROSCOPY/HOLMIUM LASER/STENT PLACEMENT Bilateral 05/15/2018   Procedure: CYSTOSCOPY/RETROGRADE/URETEROSCOPY/HOLMIUM LASER/BASKET STONE EXTRACTION,  LEFT STENT REPLACEMENT, RIGHT STENT PLACEMENT;  Surgeon: Ottelin, Mark, MD;  Location: [facility];  Service: Urology;  Laterality:  Bilateral;   FOOT SURGERY Left 2010   HOLMIUM LASER APPLICATION Bilateral 05/15/2018   Procedure: HOLMIUM LASER APPLICATION;  Surgeon: Ottelin, Mark, MD;  Location: [facility];  Service: Urology;  Laterality: Bilateral;   IR URETERAL STENT LEFT NEW ACCESS W/O SEP NEPHROSTOMY CATH  04/24/2018   LUMBAR SPINE SURGERY  2002   L3 -- 5   NEPHROLITHOTOMY Left 04/24/2018   Procedure: NEPHROLITHOTOMY PERCUTANEOUS;  Surgeon: Ottelin, Mark, MD;  Location: [facility];  Service: Urology;  Laterality: Left;   PARATHYROIDECTOMY N/A 06/22/2019   Procedure: NECK EXPLORATION WITH PARATHYROIDECTOMY WITH PARATHYROID  BIOPSY;  Surgeon: Eletha Boas, MD;  Location: [facility];  Service: General;  Laterality: N/A;   TROCHANTERIC BURSA EXCISION Left 09-02-2016    dr vernetta  @[facility]     No family history on file.  Social History:  reports that she quit smoking about 14 years ago. Her smoking use included cigarettes. She started smoking about 24 years ago. She has never used smokeless tobacco. She reports that she does not currently use alcohol . She reports that she does not use drugs.  Allergies:  Allergies  Allergen Reactions   Codeine Hives   Morphine And Codeine Itching and Nausea And Vomiting   Penicillin G Other (See Comments)   Shellfish Allergy Hives and Swelling    Medications: Scheduled:  bisoprolol   10 mg Oral Daily   And   hydrochlorothiazide   6.25 mg Oral  Daily   chlorproMAZINE   100 mg Oral QHS   lamoTRIgine   300 mg Oral Daily   pantoprazole   40 mg Oral Daily   pregabalin   75 mg Oral TID   rosuvastatin   10 mg Oral Daily   Continuous:  Results for orders placed or performed during the hospital encounter of 04/28/23 (from the past 24 hours)  Hemoglobin and hematocrit, blood     Status: Abnormal   Collection Time: 04/28/23  8:28 PM  Result Value Ref Range   Hemoglobin 9.2 (L) 12.0 - 15.0 g/dL   HCT 70.8 (L) 63.9 - 53.9 %  HIV Antibody (routine testing w rflx)     Status: None    Collection Time: 04/29/23  8:15 AM  Result Value Ref Range   HIV Screen 4th Generation wRfx Non Reactive Non Reactive  Basic metabolic panel     Status: Abnormal   Collection Time: 04/29/23  8:15 AM  Result Value Ref Range   Sodium 141 135 - 145 mmol/L   Potassium 3.8 3.5 - 5.1 mmol/L   Chloride 109 98 - 111 mmol/L   CO2 22 22 - 32 mmol/L   Glucose, Bld 112 (H) 70 - 99 mg/dL   BUN 8 8 - 23 mg/dL   Creatinine, Ser 9.06 0.44 - 1.00 mg/dL   Calcium  8.7 (L) 8.9 - 10.3 mg/dL   GFR, Estimated >39 >39 mL/min   Anion gap 10 5 - 15  CBC     Status: Abnormal   Collection Time: 04/29/23  8:15 AM  Result Value Ref Range   WBC 13.9 (H) 4.0 - 10.5 K/uL   RBC 3.94 3.87 - 5.11 MIL/uL   Hemoglobin 9.2 (L) 12.0 - 15.0 g/dL   HCT 70.6 (L) 63.9 - 53.9 %   MCV 74.4 (L) 80.0 - 100.0 fL   MCH 23.4 (L) 26.0 - 34.0 pg   MCHC 31.4 30.0 - 36.0 g/dL   RDW 77.2 (H) 88.4 - 84.4 %   Platelets 217 150 - 400 K/uL   nRBC 0.0 0.0 - 0.2 %  Blood transfusion report - scanned     Status: None ()   Collection Time: 04/29/23  9:44 AM   Narrative   Ordered by an unspecified provider.  Blood transfusion report - scanned     Status: None   Collection Time: 04/29/23  9:44 AM   Narrative   Ordered by an unspecified provider.     MR BRAIN WO CONTRAST Result Date: 04/29/2023 CLINICAL DATA:  Neuro deficit with acute stroke suspected EXAM: MRI HEAD WITHOUT CONTRAST TECHNIQUE: Multiplanar, multiecho pulse sequences of the brain and surrounding structures were obtained without intravenous contrast. COMPARISON:  Head CT and CTA from yesterday FINDINGS: Brain: No acute infarction, hemorrhage, hydrocephalus, extra-axial collection or mass lesion. Large chronic infarct in the right occipital lobe with dense encephalomalacia. Small chronic infarcts in the bilateral cerebellum. Vascular: The major flow voids are preserved Skull and upper cervical spine: Cervical spine degeneration with C3-4 anterolisthesis. No focal marrow lesion.  Sinuses/Orbits: No acute finding. IMPRESSION: 1. No acute finding. 2. 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 Electronically Signed   By: Dorn Roulette M.D.   On: 04/29/2023 04:35   CT Angio Chest Pulmonary Embolism (PE) W or WO Contrast Result Date: 04/28/2023 CLINICAL DATA:  Hypoxia. EXAM: CT ANGIOGRAPHY CHEST WITH CONTRAST TECHNIQUE: Multidetector CT imaging of the chest was performed using the standard protocol during bolus administration of intravenous contrast. Multiplanar CT image reconstructions and  MIPs were obtained to evaluate the vascular anatomy. RADIATION DOSE REDUCTION: This exam was performed according to the departmental dose-optimization program which includes automated exposure control, adjustment of the mA and/or kV according to patient size and/or use of iterative reconstruction technique. CONTRAST:  OMNIPAQUE  IOHEXOL  350 MG/ML SOLN COMPARISON:  November 21, 2021.  October 05, 2018. FINDINGS: Cardiovascular: There is no definite evidence of large pulmonary embolus seen in the main pulmonary artery or main portions of the left and right pulmonary arteries. However, there is limited opacification of the more distal branches, and smaller more peripheral pulmonary emboli cannot be excluded on the basis of this exam. Mild cardiomegaly is noted. No pericardial effusion is noted. Mediastinum/Nodes: 1.6 cm left thyroid  nodule is noted which is enlarged compared to prior exam. No significant adenopathy is noted. Large hiatal hernia is noted with most of the stomach in the thoracic space. Lungs/Pleura: No pneumothorax is noted. Mild bibasilar subsegmental atelectasis is noted. Minimal bibasilar subsegmental atelectasis is noted posteriorly in both upper lobes. Upper Abdomen: No acute abnormality. Musculoskeletal: No chest wall abnormality. No acute or significant osseous findings. Review of the MIP images confirms the above findings. IMPRESSION: No definite evidence of  large central pulmonary embolus seen in the main pulmonary artery or main portions of the left and right pulmonary arteries. However, there is limited opacification of the more distal branches of the pulmonary arteries, and therefore smaller and more peripheral pulmonary emboli cannot be excluded on the basis of this exam. 1.6 cm left thyroid  nodule is noted which appears to be enlarged compared to prior exam. Recommend thyroid  US . (Ref: J Am Coll Radiol. 2015 Feb;12(2): 143-50). Large sliding-type hiatal hernia is noted with most of the stomach seen in the thoracic space. Mild bibasilar subsegmental atelectasis is noted. Electronically Signed   By: Lynwood Landy Raddle M.D.   On: 04/28/2023 11:53   CT ANGIO HEAD NECK W WO CM (CODE STROKE) Result Date: 04/28/2023 CLINICAL DATA:  Left-sided weakness. EXAM: CT ANGIOGRAPHY HEAD AND NECK WITH AND WITHOUT CONTRAST TECHNIQUE: Multidetector CT imaging of the head and neck was performed using the standard protocol during bolus administration of intravenous contrast. Multiplanar CT image reconstructions and MIPs were obtained to evaluate the vascular anatomy. Carotid stenosis measurements (when applicable) are obtained utilizing NASCET criteria, using the distal internal carotid diameter as the denominator. RADIATION DOSE REDUCTION: This exam was performed according to the departmental dose-optimization program which includes automated exposure control, adjustment of the mA and/or kV according to patient size and/or use of iterative reconstruction technique. CONTRAST:  Dose is not known on this in progress study. COMPARISON:  Head CT from earlier today. FINDINGS: CTA NECK FINDINGS Aortic arch: 3 vessel branching. Right carotid system: Vessels are smoothly contoured and widely patent without atheromatous change. Left carotid system: Major vessels are smoothly contoured and widely patent without atheromatous change.Intermittent motion artifact and image blurring. Vertebral  arteries: Major vessels are smoothly contoured and widely patent without atheromatous change. Intermittent motion artifact. Artifact from intravenous contrast partially obscures the right V1 segment. Skeleton: Generalized degenerative disc narrowing and endplate ridging. Positional kyphosis Other neck: No acute finding. Thyroid  nodules with prior sonographic assessment. Upper chest: Reference dedicated chest CT. Review of the MIP images confirms the above findings CTA HEAD FINDINGS Anterior circulation: Atheromatous calcification of the cavernous carotids. No branch occlusion, beading, or aneurysm. No evidence of vascular malformation. Posterior circulation: Fenestrated proximal basilar. The vertebral and basilar arteries are smoothly contoured and diffusely patent. Less filling  of distal right posterior cerebral branches correlating with the chronic infarct. No aneurysm or vascular malformation Venous sinuses: Unremarkable Anatomic variants: None significant Review of the MIP images confirms the above findings IMPRESSION: 1. No emergent finding. 2. Limited atheromatous change without irregularity or significant stenosis of major arteries in the head and neck. Electronically Signed   By: Dorn Roulette M.D.   On: 04/28/2023 10:29   CT HEAD CODE STROKE WO CONTRAST Result Date: 04/28/2023 CLINICAL DATA:  Code stroke.  Left-sided weakness EXAM: CT HEAD WITHOUT CONTRAST TECHNIQUE: Contiguous axial images were obtained from the base of the skull through the vertex without intravenous contrast. RADIATION DOSE REDUCTION: This exam was performed according to the departmental dose-optimization program which includes automated exposure control, adjustment of the mA and/or kV according to patient size and/or use of iterative reconstruction technique. COMPARISON:  None Available. FINDINGS: Brain: No evidence of acute infarction, hemorrhage, hydrocephalus, extra-axial collection or mass lesion/mass effect. Dense  encephalomalacia in the right occipital lobe extending into the parietal cortex consistent with old infarct. Low-density the inferior right temporal cortex on coronal reformats is attributed to streak artifact based on the other views. Vascular: No hyperdense vessel or unexpected calcification. Skull: Normal. Negative for fracture or focal lesion. Sinuses/Orbits: Negative Other: Prelim sent in epic chat. ASPECTS [facility] Stroke Program Early CT Score) - Ganglionic level infarction (caudate, lentiform nuclei, internal capsule, insula, M1-M3 cortex): 7 - Supraganglionic infarction (M4-M6 cortex): 3 Total score (0-10 with 10 being normal): 10 IMPRESSION: 1. No acute finding. 2. Chronic right occipital parietal infarct. Electronically Signed   By: Dorn Roulette M.D.   On: 04/28/2023 10:16    ROS:  As stated above in the HPI otherwise negative.  Blood pressure 128/74, pulse 94, temperature 99.1 F (37.3 C), temperature source Oral, resp. rate 20, weight 74.4 kg, SpO2 100%.    PE: Gen: NAD, somnolent Lungs: CTA Bilaterally CV: RRR without M/G/R ABD: Soft, NTND, +BS Ext: No C/C/E  Assessment/Plan: 1) Microcytic anemia. 2) Heme positive stool. 3) CVA.   Further evaluation with endoscopy is necessary, but she is not alert enough to pursue the procedure.  She had a recent colonoscopy, but she is in no condition to repeat the colonoscopy.  The patient will be followed closely this weekend and further recommendations will be made pending her clinical course.  Plan: 1) Continue to monitor HGB and transfuse if necessary. 2) Await EEG.   Taeden Geller D 04/29/2023, 1:24 PM

## 2023-04-30 DIAGNOSIS — T50905A Adverse effect of unspecified drugs, medicaments and biological substances, initial encounter: Secondary | ICD-10-CM | POA: Diagnosis not present

## 2023-04-30 DIAGNOSIS — D649 Anemia, unspecified: Secondary | ICD-10-CM | POA: Diagnosis not present

## 2023-04-30 DIAGNOSIS — R400 Somnolence: Secondary | ICD-10-CM | POA: Diagnosis not present

## 2023-04-30 LAB — CBC
HCT: 27.4 % — ABNORMAL LOW (ref 36.0–46.0)
Hemoglobin: 8.8 g/dL — ABNORMAL LOW (ref 12.0–15.0)
MCH: 23.7 pg — ABNORMAL LOW (ref 26.0–34.0)
MCHC: 32.1 g/dL (ref 30.0–36.0)
MCV: 73.7 fL — ABNORMAL LOW (ref 80.0–100.0)
Platelets: 265 10*3/uL (ref 150–400)
RBC: 3.72 MIL/uL — ABNORMAL LOW (ref 3.87–5.11)
RDW: 23.2 % — ABNORMAL HIGH (ref 11.5–15.5)
WBC: 15.1 10*3/uL — ABNORMAL HIGH (ref 4.0–10.5)
nRBC: 0.1 % (ref 0.0–0.2)

## 2023-04-30 LAB — COMPREHENSIVE METABOLIC PANEL
ALT: 9 U/L (ref 0–44)
AST: 18 U/L (ref 15–41)
Albumin: 2.9 g/dL — ABNORMAL LOW (ref 3.5–5.0)
Alkaline Phosphatase: 55 U/L (ref 38–126)
Anion gap: 10 (ref 5–15)
BUN: 7 mg/dL — ABNORMAL LOW (ref 8–23)
CO2: 23 mmol/L (ref 22–32)
Calcium: 9 mg/dL (ref 8.9–10.3)
Chloride: 106 mmol/L (ref 98–111)
Creatinine, Ser: 0.95 mg/dL (ref 0.44–1.00)
GFR, Estimated: >60 mL/min (ref >60)
Glucose, Bld: 126 mg/dL — ABNORMAL HIGH (ref 70–99)
Potassium: 3.2 mmol/L — ABNORMAL LOW (ref 3.5–5.1)
Sodium: 139 mmol/L (ref 135–145)
Total Bilirubin: 0.7 mg/dL (ref 0.0–1.2)
Total Protein: 6.2 g/dL — ABNORMAL LOW (ref 6.5–8.1)

## 2023-04-30 LAB — MAGNESIUM: Magnesium: 1.9 mg/dL (ref 1.7–2.4)

## 2023-04-30 MED ORDER — DICLOFENAC SODIUM 1 % EX GEL
4.0000 g | Freq: Four times a day (QID) | CUTANEOUS | Status: DC
  Administered 2023-04-30 – 2023-05-01 (×3): 4 g via TOPICAL
  Filled 2023-04-30: qty 100

## 2023-04-30 MED ORDER — LIDOCAINE 5 % EX PTCH
2.0000 | MEDICATED_PATCH | CUTANEOUS | Status: DC
  Administered 2023-04-30: 2 via TRANSDERMAL
  Filled 2023-04-30 (×2): qty 2

## 2023-04-30 MED ORDER — ACETAMINOPHEN 500 MG PO TABS
1000.0000 mg | ORAL_TABLET | Freq: Four times a day (QID) | ORAL | Status: DC
  Administered 2023-04-30 – 2023-05-02 (×7): 1000 mg via ORAL
  Filled 2023-04-30 (×8): qty 2

## 2023-04-30 NOTE — Progress Notes (Signed)
                 Interval history Is experiencing some of her chronic back pain this morning.  Son and daughter at bedside say that the patient is better today, closer to baseline mental status.  Speech is returning to normal but is kind of quiet.  She still seems sleepy to them.  Physical exam Blood pressure 133/80, pulse 100, temperature 98.9 F (37.2 C), temperature source Oral, resp. rate 17, weight 74.4 kg, SpO2 97%.  No distress, drowsy appearing Heart rate and rhythm normal, radial pulses strong Breathing comfortably on room air Abdomen soft and nontender Skin is warm and dry Alert and oriented to person, hospital, year, says December when it is in fact January, speech is quiet but fluent, no facial asymmetry  Labs, images, and other studies Stable hemoglobin Uptrending leukocytosis  Assessment and plan Hospital day 1  Sierra Krueger is a 68 y.o. with history of cerebral infarction and residual right-sided deficits, chronic pain, and sleep troubles who presented with aphasia and right-sided weakness, found to be profoundly anemic and encephalopathic.  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  Principal Problem:   Acute on chronic anemia Stable hemoglobin.  Stable vital signs.  No reports of bleeding in interval.  Suspect slow blood loss from somewhere in GI tract.  Endoscopic evaluation is necessary but she is too encephalopathic for this at present.  Continue monitoring hemoglobin.  Appreciate GI insight in this case.  Active Problems:   Drug reaction Stroke has been ruled out.  Postictal encephalopathy less likely, no seizures have been confirmed and duration is atypical for postictal state.  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  Apparently she is taking chlorpromazine  at nighttime for sleep.  She is also on hydroxyzine  for anxiety, pregabalin  and oxycodone  for pain, and lamotrigine  for an unknown indication, perhaps complex regional pain syndrome per  podiatry note from 2021, but I am unsure. - Discontinue chlorpromazine , hydroxyzine , pregabalin , and oxycodone  - Lamotrigine  is continued because of uncertainty around indication and potential for prehospital seizure    Chronic pain syndrome Pain generators back today.  Apparently, per podiatry note from 2021, she may have complex regional pain syndrome affecting her left foot.  For now, discontinuing all potent CNS depressants.  Will start with multimodal pain therapy that is nonsedating, only adding back oxycodone  for breakthrough pain despite these interventions.  This is imperative for workup of encephalopathy. - Start Tylenol  1000 mg every 6 hours - Topicals including Voltaren  gel and lidocaine  patch - Aqua thermia    Essential hypertension Chronic and stable.  Continue bisoprolol  and hydrochlorothiazide .    Thyroid  nodule Follow-up thyroid  ultrasound outpatient if in line with goals of care.  Resolved Problems:   Hypoxic respiratory failure (HCC)  Diet: Dysphagia 1, appreciate SLP assistance IVF: N/A VTE: SCDs Start: 04/28/23 1341  Code: Full PT/OT recommendations: Home health PT/OT versus SNF, pending continued workup TOC recommendations: N/A Family Update: At bedside  Discharge plan: Pending workup of acute anemia and drug reaction.  [name] MD 04/30/2023, 11:52 AM  Pager: [phone] After 5pm or weekend: [phone]

## 2023-04-30 NOTE — Progress Notes (Signed)
 While in another patient room bed alarm observed sounded. Upon entering patient's room, patient noted sitting up on floor at side of bed. Patient stated I'm trying to get up to go see my son who is down stairs. Attempted to reorient patient unsuccessfully as to location and that she was in the hospital. Patient unwilling to accept. Patient denies pain or hitting head. Able to answer all other orientation questions without difficulty. Patient assessed and no injuries noted. Patient assisted x3 assist back to bed. Providers making rounds at this time as well and notified of patient's fall and assessed patient face to face. Charge nurse notified of patient's fall. Attempted to contact son, Lonni and daughter Larrissa, without success to notify as well. Bed alarm turned on and mats applied to floor as well in attempt to prevent injuries and fall.

## 2023-04-30 NOTE — Progress Notes (Signed)
 Subjective: No acute events.  Nursing reports that the patient is very confused.  Objective: Vital signs in last 24 hours: Temp:  [97.6 F (36.4 C)-99.1 F (37.3 C)] 98.9 F (37.2 C) (01/04 0320) Pulse Rate:  [92-100] 100 (01/04 0320) Resp:  [17-20] 17 (01/04 0320) BP: (117-135)/(74-90) 133/80 (01/04 0320) SpO2:  [95 %-100 %] 97 % (01/04 0320) Last BM Date :  (PTA)  Intake/Output from previous day: No intake/output data recorded. Intake/Output this shift: No intake/output data recorded.  General appearance: confused, answers some questions GI: soft, non-tender; bowel sounds normal; no masses,  no organomegaly  Lab Results: Recent Labs    04/28/23 1003 04/28/23 1004 04/28/23 2028 04/29/23 0815  WBC 10.3  --   --  13.9*  HGB 5.7* 6.5* 9.2* 9.2*  HCT 20.1* 19.0* 29.1* 29.3*  PLT 242  --   --  217   BMET Recent Labs    04/28/23 1003 04/28/23 1004 04/29/23 0815  NA 138 138 141  K 3.3* 3.3* 3.8  CL 107 105 109  CO2 21*  --  22  GLUCOSE 146* 146* 112*  BUN 13 13 8   CREATININE 1.18* 1.20* 0.93  CALCIUM  8.1*  --  8.7*   LFT Recent Labs    04/28/23 1003  PROT 5.7*  ALBUMIN 2.7*  AST 14*  ALT 8  ALKPHOS 52  BILITOT 0.4   PT/INR Recent Labs    04/28/23 1003  LABPROT 16.4*  INR 1.3*   Hepatitis Panel No results for input(s): HEPBSAG, HCVAB, HEPAIGM, HEPBIGM in the last 72 hours. C-Diff No results for input(s): CDIFFTOX in the last 72 hours. Fecal Lactopherrin No results for input(s): FECLLACTOFRN in the last 72 hours.  Studies/Results: EEG adult Result Date: 04/29/2023 Shelton Arlin KIDD, MD     04/29/2023  4:56 PM Patient Name: Sierra Krueger MRN: [id number] Epilepsy Attending: Arlin KIDD Shelton Referring Physician/Provider: Norrine Sharper, MD Date: 04/28/2022 Duration: 26.45 mins Patient history: 68yo F with ams getting eeg to evaluate for seizure Level of alertness: Awake, asleep AEDs during EEG study: LTG, PGB Technical aspects: This EEG study  was done with scalp electrodes positioned according to the 10-20 International system of electrode placement. Electrical activity was reviewed with band pass filter of 1-70Hz , sensitivity of 7 uV/mm, display speed of 65mm/sec with a 60Hz  notched filter applied as appropriate. EEG data were recorded continuously and digitally stored.  Video monitoring was available and reviewed as appropriate. Description: The posterior dominant rhythm consists of 8 Hz activity of moderate voltage (25-35 uV) seen predominantly in posterior head regions, symmetric and reactive to eye opening and eye closing. Sleep was characterized by vertex waves, sleep spindles (12 to 14 Hz), maximal frontocentral region. EEG showed intermittent generalized 3 to 6 Hz theta-delta slowing. Physiologic photic driving was not seen during photic stimulation.  Hyperventilation was not performed.   ABNORMALITY - Intermittent slow, generalized IMPRESSION: This study is suggestive of mild diffuse encephalopathy. No seizures or epileptiform discharges were seen throughout the recording. Arlin KIDD Shelton   MR BRAIN WO CONTRAST Result Date: 04/29/2023 CLINICAL DATA:  Neuro deficit with acute stroke suspected EXAM: MRI HEAD WITHOUT CONTRAST TECHNIQUE: Multiplanar, multiecho pulse sequences of the brain and surrounding structures were obtained without intravenous contrast. COMPARISON:  Head CT and CTA from yesterday FINDINGS: Brain: No acute infarction, hemorrhage, hydrocephalus, extra-axial collection or mass lesion. Large chronic infarct in the right occipital lobe with dense encephalomalacia. Small chronic infarcts in the bilateral cerebellum. Vascular: The major flow voids  are preserved Skull and upper cervical spine: Cervical spine degeneration with C3-4 anterolisthesis. No focal marrow lesion. Sinuses/Orbits: No acute finding. IMPRESSION: 1. No acute finding. 2. 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 Electronically Signed    By: Dorn Roulette M.D.   On: 04/29/2023 04:35   CT Angio Chest Pulmonary Embolism (PE) W or WO Contrast Result Date: 04/28/2023 CLINICAL DATA:  Hypoxia. EXAM: CT ANGIOGRAPHY CHEST WITH CONTRAST TECHNIQUE: Multidetector CT imaging of the chest was performed using the standard protocol during bolus administration of intravenous contrast. Multiplanar CT image reconstructions and MIPs were obtained to evaluate the vascular anatomy. RADIATION DOSE REDUCTION: This exam was performed according to the departmental dose-optimization program which includes automated exposure control, adjustment of the mA and/or kV according to patient size and/or use of iterative reconstruction technique. CONTRAST:  OMNIPAQUE  IOHEXOL  350 MG/ML SOLN COMPARISON:  November 21, 2021.  October 05, 2018. FINDINGS: Cardiovascular: There is no definite evidence of large pulmonary embolus seen in the main pulmonary artery or main portions of the left and right pulmonary arteries. However, there is limited opacification of the more distal branches, and smaller more peripheral pulmonary emboli cannot be excluded on the basis of this exam. Mild cardiomegaly is noted. No pericardial effusion is noted. Mediastinum/Nodes: 1.6 cm left thyroid  nodule is noted which is enlarged compared to prior exam. No significant adenopathy is noted. Large hiatal hernia is noted with most of the stomach in the thoracic space. Lungs/Pleura: No pneumothorax is noted. Mild bibasilar subsegmental atelectasis is noted. Minimal bibasilar subsegmental atelectasis is noted posteriorly in both upper lobes. Upper Abdomen: No acute abnormality. Musculoskeletal: No chest wall abnormality. No acute or significant osseous findings. Review of the MIP images confirms the above findings. IMPRESSION: No definite evidence of large central pulmonary embolus seen in the main pulmonary artery or main portions of the left and right pulmonary arteries. However, there is limited opacification  of the more distal branches of the pulmonary arteries, and therefore smaller and more peripheral pulmonary emboli cannot be excluded on the basis of this exam. 1.6 cm left thyroid  nodule is noted which appears to be enlarged compared to prior exam. Recommend thyroid  US . (Ref: J Am Coll Radiol. 2015 Feb;12(2): 143-50). Large sliding-type hiatal hernia is noted with most of the stomach seen in the thoracic space. Mild bibasilar subsegmental atelectasis is noted. Electronically Signed   By: Lynwood Landy Raddle M.D.   On: 04/28/2023 11:53   CT ANGIO HEAD NECK W WO CM (CODE STROKE) Result Date: 04/28/2023 CLINICAL DATA:  Left-sided weakness. EXAM: CT ANGIOGRAPHY HEAD AND NECK WITH AND WITHOUT CONTRAST TECHNIQUE: Multidetector CT imaging of the head and neck was performed using the standard protocol during bolus administration of intravenous contrast. Multiplanar CT image reconstructions and MIPs were obtained to evaluate the vascular anatomy. Carotid stenosis measurements (when applicable) are obtained utilizing NASCET criteria, using the distal internal carotid diameter as the denominator. RADIATION DOSE REDUCTION: This exam was performed according to the departmental dose-optimization program which includes automated exposure control, adjustment of the mA and/or kV according to patient size and/or use of iterative reconstruction technique. CONTRAST:  Dose is not known on this in progress study. COMPARISON:  Head CT from earlier today. FINDINGS: CTA NECK FINDINGS Aortic arch: 3 vessel branching. Right carotid system: Vessels are smoothly contoured and widely patent without atheromatous change. Left carotid system: Major vessels are smoothly contoured and widely patent without atheromatous change.Intermittent motion artifact and image blurring. Vertebral arteries: Major  vessels are smoothly contoured and widely patent without atheromatous change. Intermittent motion artifact. Artifact from intravenous contrast partially  obscures the right V1 segment. Skeleton: Generalized degenerative disc narrowing and endplate ridging. Positional kyphosis Other neck: No acute finding. Thyroid  nodules with prior sonographic assessment. Upper chest: Reference dedicated chest CT. Review of the MIP images confirms the above findings CTA HEAD FINDINGS Anterior circulation: Atheromatous calcification of the cavernous carotids. No branch occlusion, beading, or aneurysm. No evidence of vascular malformation. Posterior circulation: Fenestrated proximal basilar. The vertebral and basilar arteries are smoothly contoured and diffusely patent. Less filling of distal right posterior cerebral branches correlating with the chronic infarct. No aneurysm or vascular malformation Venous sinuses: Unremarkable Anatomic variants: None significant Review of the MIP images confirms the above findings IMPRESSION: 1. No emergent finding. 2. Limited atheromatous change without irregularity or significant stenosis of major arteries in the head and neck. Electronically Signed   By: Dorn Roulette M.D.   On: 04/28/2023 10:29   CT HEAD CODE STROKE WO CONTRAST Result Date: 04/28/2023 CLINICAL DATA:  Code stroke.  Left-sided weakness EXAM: CT HEAD WITHOUT CONTRAST TECHNIQUE: Contiguous axial images were obtained from the base of the skull through the vertex without intravenous contrast. RADIATION DOSE REDUCTION: This exam was performed according to the departmental dose-optimization program which includes automated exposure control, adjustment of the mA and/or kV according to patient size and/or use of iterative reconstruction technique. COMPARISON:  None Available. FINDINGS: Brain: No evidence of acute infarction, hemorrhage, hydrocephalus, extra-axial collection or mass lesion/mass effect. Dense encephalomalacia in the right occipital lobe extending into the parietal cortex consistent with old infarct. Low-density the inferior right temporal cortex on coronal reformats is  attributed to streak artifact based on the other views. Vascular: No hyperdense vessel or unexpected calcification. Skull: Normal. Negative for fracture or focal lesion. Sinuses/Orbits: Negative Other: Prelim sent in epic chat. ASPECTS Lillian Stroke Program Early CT Score) - Ganglionic level infarction (caudate, lentiform nuclei, internal capsule, insula, M1-M3 cortex): 7 - Supraganglionic infarction (M4-M6 cortex): 3 Total score (0-10 with 10 being normal): 10 IMPRESSION: 1. No acute finding. 2. Chronic right occipital parietal infarct. Electronically Signed   By: Dorn Roulette M.D.   On: 04/28/2023 10:16    Medications: Scheduled:  bisoprolol   10 mg Oral Daily   And   hydrochlorothiazide   6.25 mg Oral Daily   lamoTRIgine   300 mg Oral Daily   pantoprazole   40 mg Oral Daily   pregabalin   75 mg Oral TID   rosuvastatin   10 mg Oral Daily   Continuous:  Assessment/Plan: 1) Anemia. 2) Heme positive stool. 3) CVA. 4) AMS.   The patient remains hemodynamically stable.  There is no evidence of any active bleeding.  HGB is not available for today.  Plan: 1) Continue to monitor HGB and transfuse as necessary. 2) Continue PPI. 3) Once mental status improves an EGD can be performed.  LOS: 1 day   Tamecca Artiga D 04/30/2023, 8:45 AM

## 2023-04-30 NOTE — Progress Notes (Signed)
 Return call received from patient's daughter, Josph Macho. Family notified of patient's fall and educated on fall preventions and safety implementations including bed alarm and mats to floor

## 2023-04-30 NOTE — Progress Notes (Signed)
 Physical Therapy Treatment Patient Details Name: Sierra Krueger MRN: [id number] DOB: January 03, 1956 Today's Date: 04/30/2023   History of Present Illness 68 yo female presented to ED on 05/08/2023 with R sided weakness and aphasia. MRI negative for acute stroke. Likely recrudescence of prior stroke symptoms due to significant anemia and sedation due to polypharmacy. PMH - HTN, rt CVA with residual lt weakness, back surgery, ckd, PE, parathyroidectomy.    PT Comments  Pt resting in bed on arrival, A&Ox4 and participatory in session with continued progress towards acute goals. Pt able to progress gait distance with up to mod A to manage RW and provide steadying assist as pt with tendency to increase gait speed and push RW too far out in front of self. Pt requiring increased assist, up to mod A, for bed mobility and transfers with increased time needed to power up to stand. Pt family present throughout session and supportive. Pt continues to benefit from skilled PT services to progress toward functional mobility goals.     If plan is discharge home, recommend the following: A little help with walking and/or transfers;A little help with bathing/dressing/bathroom;Assistance with cooking/housework;Direct supervision/assist for medications management;Assist for transportation   Can travel by private vehicle        Equipment Recommendations  Rolling walker (2 wheels)    Recommendations for Other Services       Precautions / Restrictions Precautions Precautions: Fall Restrictions Weight Bearing Restrictions Per Provider Order: No     Mobility  Bed Mobility Overal bed mobility: Needs Assistance Bed Mobility: Sit to Supine   Sidelying to sit: Mod assist   Sit to supine: Min assist   General bed mobility comments: mod A to elevate trunk and manage LEs off EOB, min A to return to supine    Transfers Overall transfer level: Needs assistance Equipment used: Rolling walker (2 wheels) Transfers: Sit  to/from Stand Sit to Stand: Min assist, Mod assist           General transfer comment: mod A initially to boost to stand waning to min A to facilitate anterior weight shift to rise. increased time and effortful    Ambulation/Gait Ambulation/Gait assistance: Min assist, Mod assist Gait Distance (Feet): 90 Feet Assistive device: Rolling walker (2 wheels) Gait Pattern/deviations: Step-through pattern, Decreased stride length, Wide base of support, Leaning posteriorly Gait velocity: decr     General Gait Details: Min assist for RW control, cues to open eyes and to slow gait speed and for upright trunk, increased instabiltiy with increased distance.   Stairs             Wheelchair Mobility     Tilt Bed    Modified Rankin (Stroke Patients Only)       Balance Overall balance assessment: Needs assistance Sitting-balance support: No upper extremity supported, Feet supported Sitting balance-Leahy Scale: Fair Sitting balance - Comments: progressed from min A to CGA Postural control: Posterior lean Standing balance support: Bilateral upper extremity supported, During functional activity, Reliant on assistive device for balance Standing balance-Leahy Scale: Poor Standing balance comment: reliant on UE support                            Cognition Arousal: Alert Behavior During Therapy: Flat affect Overall Cognitive Status: Impaired/Different from baseline  General Comments: pt A&Ox4, slow processing and slow to respond, following all simple commands with increased time        Exercises      General Comments General comments (skin integrity, edema, etc.): VSS on RA, family present and reporting pt noramlly ambulates very short distances in home      Pertinent Vitals/Pain Pain Assessment Pain Assessment: Faces Faces Pain Scale: Hurts a little bit Pain Location: back Pain Descriptors / Indicators:  Aching Pain Intervention(s): Monitored during session, Limited activity within patient's tolerance    Home Living                          Prior Function            PT Goals (current goals can now be found in the care plan section) Acute Rehab PT Goals Patient Stated Goal: Go home PT Goal Formulation: With patient Time For Goal Achievement: 05/13/23 Progress towards PT goals: Progressing toward goals    Frequency    Min 1X/week      PT Plan      Co-evaluation              AM-PAC PT 6 Clicks Mobility   Outcome Measure  Help needed turning from your back to your side while in a flat bed without using bedrails?: A Lot Help needed moving from lying on your back to sitting on the side of a flat bed without using bedrails?: A Little Help needed moving to and from a bed to a chair (including a wheelchair)?: A Little Help needed standing up from a chair using your arms (e.g., wheelchair or bedside chair)?: A Little Help needed to walk in hospital room?: A Little Help needed climbing 3-5 steps with a railing? : A Lot 6 Click Score: 16    End of Session Equipment Utilized During Treatment: Gait belt Activity Tolerance: Patient tolerated treatment well Patient left: with call bell/phone within reach;in bed;with bed alarm set;with family/visitor present Nurse Communication: Mobility status PT Visit Diagnosis: Unsteadiness on feet (R26.81);Other abnormalities of gait and mobility (R26.89);Muscle weakness (generalized) (M62.81);History of falling (Z91.81);Other symptoms and signs involving the nervous system (R29.898);Pain Pain - part of body:  (back)     Time: 8996-8971 PT Time Calculation (min) (ACUTE ONLY): 25 min  Charges:    $Gait Training: 8-22 mins $Therapeutic Activity: 8-22 mins PT General Charges $$ ACUTE PT VISIT: 1 Visit                     Demita Tobia R. PTA Acute Rehabilitation Services Office: [phone]   Therisa CHRISTELLA Boor 04/30/2023, 12:53  PM

## 2023-05-01 MED ORDER — POTASSIUM CHLORIDE CRYS ER 20 MEQ PO TBCR
40.0000 meq | EXTENDED_RELEASE_TABLET | Freq: Once | ORAL | Status: DC
  Filled 2023-05-01 (×2): qty 2

## 2023-05-01 NOTE — Progress Notes (Signed)
 HD#2 SUBJECTIVE:  Patient Summary: Sierra Krueger is a 68 y.o. female with a pertinent PMH of CVA w/ right-sided deficits, microcytic anemia, chronic pain, complex regional pain syndrome, and HTN who presented with aphasia and right-sided weakness and is admitted for stroke rule out and acute microcytic anemia.   Overnight Events: Fall at 8:50, evaluated by night team, no injuries. Delirium and fall precautions.   Interim History: Patient evaluated at bedside. Still remains quite somnolent, but oriented and perhaps mildly improved in responsiveness. States she feels well and has no new concerns.   OBJECTIVE:  Vital Signs: Vitals:   04/30/23 0320 04/30/23 2000 04/30/23 2047 05/01/23 0428  BP: 133/80 133/78 (!) 154/83 139/81  Pulse: 100 93 95 88  Resp: 17 18 17 18   Temp: 98.9 F (37.2 C) 97.8 F (36.6 C) 98.3 F (36.8 C) 98 F (36.7 C)  TempSrc: Oral Oral Oral Oral  SpO2: 97% 100% 98% 97%  Weight:       Supplemental O2: Room air  Physical Exam: General: laying in bed, comfortable, somnolent but arousable Cardiac: regular rate and rhythm, no murmurs Pulmonary: CTA bilaterally, normal effort and rate Abdomen: soft, non-tender, non-distended Neuro: remains somnolent, but is arousable and oriented x 3   Pertinent Labs:    Latest Ref Rng & Units 04/30/2023   11:30 AM 04/29/2023    8:15 AM 04/28/2023    8:28 PM  CBC  WBC 4.0 - 10.5 K/uL 15.1  13.9    Hemoglobin 12.0 - 15.0 g/dL 8.8  9.2  9.2   Hematocrit 36.0 - 46.0 % 27.4  29.3  29.1   Platelets 150 - 400 K/uL 265  217         Latest Ref Rng & Units 04/30/2023   11:30 AM 04/29/2023    8:15 AM 04/28/2023   10:04 AM  CMP  Glucose 70 - 99 mg/dL 873  887  853   BUN 8 - 23 mg/dL 7  8  13    Creatinine 0.44 - 1.00 mg/dL 9.04  9.06  8.79   Sodium 135 - 145 mmol/L 139  141  138   Potassium 3.5 - 5.1 mmol/L 3.2  3.8  3.3   Chloride 98 - 111 mmol/L 106  109  105   CO2 22 - 32 mmol/L 23  22    Calcium  8.9 - 10.3 mg/dL 9.0  8.7    Total  Protein 6.5 - 8.1 g/dL 6.2     Total Bilirubin 0.0 - 1.2 mg/dL 0.7     Alkaline Phos 38 - 126 U/L 55     AST 15 - 41 U/L 18     ALT 0 - 44 U/L 9       ASSESSMENT/PLAN:  Assessment: Principal Problem:   Acute on chronic anemia Active Problems:   Essential hypertension   Chronic pain syndrome   Thyroid  nodule   Drug reaction  PMH of CVA w/ right-sided deficits, microcytic anemia, chronic pain, complex regional pain syndrome, and HTN who presented with aphasia and right-sided weakness and is admitted for stroke rule out and acute microcytic anemia on hospital day 3.   Plan: Somnolence, likely secondary to polypharmacy Patient remains quite somnolent. Stroke ruled out. EEG showed mild diffuse encephalopathy but did not show any evidence of seizures. PT/OT recommended placement at a SNF vs HHPT pending her hospital recovery. Currently she is a high fall risk, so I feel she may benefit from further rehabilitation at a SNF. She is  on a high dose of Lamotrigine , so we will check a Lamotrigine  level as this may be contributing to her somnolence.  - Centrally-acting medications held (chlorpromazine , hydroxyzine , pregabalin , oxycodone ) - Lamotrigine  continued; checking a Lamotrigine  trough tomorrow morning before her next dose - Dys 1 diet; SLP following  Acute on chronic microcytic anemia Hemoglobin 8.8 this morning, down from 9.2. GI following for workup.  Source of bleeding not yet identified but most likely GI. Does have iron deficiency and may need repletion outpatient. - GI following; plan is for an EGD on Tuesday if somnolence continues to improve - Holding aspirin  Chronic conditions: Chronic pain/Complex regional pain syndrome: Patient is on oxycodone , pregabalin , and tizanidine  for pain management outpatient.  We are holding these medications in the setting of her somnolence. Psychiatric disorder: Patient is taking Lamictal  300 mg daily, hydroxyzine  as needed, and chlorpromazine  100  mg at bedtime outpatient. These are prescribed by her psychiatrist, Dr. Jerrell Forehand. It looks like she has been taking these medications for several years. We have continued Lamotrigine  but are holding Hydroxyzine  and chlorpromazine  in the setting of somnolence. HTN: Continuing home bisoprolol  10 mg daily, hydrochlorothiazide  6.25 mg daily Hyperparathyroidism s/p parathyroidectomy (2021)  Best Practice: Diet: Dys 1 IVF: Fluids: None VTE: SCDs Start: 04/28/23 1341 Code: Full AB: None Therapy Recs: Pending DISPO: Anticipated discharge in 2-4 days pending GI bleed workup and improvement of somnolence.  Signature: Ozell Riff, MD  Internal Medicine Resident, PGY-1 [facility]  Pager: [phone] 12:59 PM, 05/01/2023   Please contact the on call pager after 5 pm and on weekends at [phone].

## 2023-05-01 NOTE — Progress Notes (Signed)
 Subjective: No complaints.  Objective: Vital signs in last 24 hours: Temp:  [97.8 F (36.6 C)-98.3 F (36.8 C)] 98 F (36.7 C) (01/05 0428) Pulse Rate:  [88-95] 88 (01/05 0428) Resp:  [17-18] 18 (01/05 0428) BP: (133-154)/(78-83) 139/81 (01/05 0428) SpO2:  [97 %-100 %] 97 % (01/05 0428) Last BM Date : 04/29/22  Intake/Output from previous day: No intake/output data recorded. Intake/Output this shift: No intake/output data recorded.  General appearance: sleepy, but answers questions. GI: soft, non-tender; bowel sounds normal; no masses,  no organomegaly  Lab Results: Recent Labs    04/28/23 1003 04/28/23 1004 04/28/23 2028 04/29/23 0815 04/30/23 1130  WBC 10.3  --   --  13.9* 15.1*  HGB 5.7*   < > 9.2* 9.2* 8.8*  HCT 20.1*   < > 29.1* 29.3* 27.4*  PLT 242  --   --  217 265   < > = values in this interval not displayed.   BMET Recent Labs    04/28/23 1003 04/28/23 1004 04/29/23 0815 04/30/23 1130  NA 138 138 141 139  K 3.3* 3.3* 3.8 3.2*  CL 107 105 109 106  CO2 21*  --  22 23  GLUCOSE 146* 146* 112* 126*  BUN 13 13 8  7*  CREATININE 1.18* 1.20* 0.93 0.95  CALCIUM  8.1*  --  8.7* 9.0   LFT Recent Labs    04/30/23 1130  PROT 6.2*  ALBUMIN 2.9*  AST 18  ALT 9  ALKPHOS 55  BILITOT 0.7   PT/INR Recent Labs    04/28/23 1003  LABPROT 16.4*  INR 1.3*   Hepatitis Panel No results for input(s): HEPBSAG, HCVAB, HEPAIGM, HEPBIGM in the last 72 hours. C-Diff No results for input(s): CDIFFTOX in the last 72 hours. Fecal Lactopherrin No results for input(s): FECLLACTOFRN in the last 72 hours.  Studies/Results: EEG adult Result Date: 04/29/2023 Shelton Arlin KIDD, MD     04/29/2023  4:56 PM Patient Name: Sierra Krueger MRN: [id number] Epilepsy Attending: Arlin KIDD Shelton Referring Physician/Provider: Norrine Sharper, MD Date: 04/28/2022 Duration: 26.45 mins Patient history: 68yo F with ams getting eeg to evaluate for seizure Level of alertness: Awake,  asleep AEDs during EEG study: LTG, PGB Technical aspects: This EEG study was done with scalp electrodes positioned according to the 10-20 International system of electrode placement. Electrical activity was reviewed with band pass filter of 1-70Hz , sensitivity of 7 uV/mm, display speed of 59mm/sec with a 60Hz  notched filter applied as appropriate. EEG data were recorded continuously and digitally stored.  Video monitoring was available and reviewed as appropriate. Description: The posterior dominant rhythm consists of 8 Hz activity of moderate voltage (25-35 uV) seen predominantly in posterior head regions, symmetric and reactive to eye opening and eye closing. Sleep was characterized by vertex waves, sleep spindles (12 to 14 Hz), maximal frontocentral region. EEG showed intermittent generalized 3 to 6 Hz theta-delta slowing. Physiologic photic driving was not seen during photic stimulation.  Hyperventilation was not performed.   ABNORMALITY - Intermittent slow, generalized IMPRESSION: This study is suggestive of mild diffuse encephalopathy. No seizures or epileptiform discharges were seen throughout the recording. Priyanka O Yadav    Medications: Scheduled:  acetaminophen   1,000 mg Oral Q6H   bisoprolol   10 mg Oral Daily   And   hydrochlorothiazide   6.25 mg Oral Daily   diclofenac  Sodium  4 g Topical QID   lamoTRIgine   300 mg Oral Daily   lidocaine   2 patch Transdermal Q24H   pantoprazole   40 mg Oral Daily   potassium chloride   40 mEq Oral Once   rosuvastatin   10 mg Oral Daily   Continuous:  Assessment/Plan: 1) Anemia. 2) AMS. 3) CVA.   Her mental status this AM appears better, but she is still somnolent.  Her HGB is relatively stable.  No overt evidence of bleeding.  If she continues to improve the plan will be for an EGD on Tuesday.  Plan: 1) Continue supportive care. 2) Possible EGD on Tuesday.  LOS: 2 days   Kai Calico D 05/01/2023, 7:44 AM

## 2023-05-01 NOTE — Progress Notes (Signed)
 Rounding on patient around 8:50 PM, RN outside the patient room, notifies that the patient had a fall. RN states that she was in another room when she heard the bed alarm, found the patient sitting on the floor beside the bed.  Patient is evaluated bedside.  She is laying in bed comfortably no acute distress.  She is alert and oriented to self, date of birth, month.  She is not oriented to location, states that she wants to go downstairs and get her son from the living room.  Patient is notified that she is currently in the hospital.  Patient is unwilling to accept that she is currently in the hospital.  No injuries noted.  Patient denies any acute pain at this time.  RN was notified for delirium precautions and fall precautions.

## 2023-05-02 DIAGNOSIS — G894 Chronic pain syndrome: Secondary | ICD-10-CM

## 2023-05-02 DIAGNOSIS — I10 Essential (primary) hypertension: Secondary | ICD-10-CM | POA: Diagnosis not present

## 2023-05-02 DIAGNOSIS — D649 Anemia, unspecified: Secondary | ICD-10-CM | POA: Diagnosis not present

## 2023-05-02 LAB — CBC
HCT: 32.6 % — ABNORMAL LOW (ref 36.0–46.0)
Hemoglobin: 10.3 g/dL — ABNORMAL LOW (ref 12.0–15.0)
MCH: 23.4 pg — ABNORMAL LOW (ref 26.0–34.0)
MCHC: 31.6 g/dL (ref 30.0–36.0)
MCV: 74.1 fL — ABNORMAL LOW (ref 80.0–100.0)
Platelets: 323 10*3/uL (ref 150–400)
RBC: 4.4 MIL/uL (ref 3.87–5.11)
RDW: 23.8 % — ABNORMAL HIGH (ref 11.5–15.5)
WBC: 9.5 10*3/uL (ref 4.0–10.5)
nRBC: 0 % (ref 0.0–0.2)

## 2023-05-02 LAB — DIFFERENTIAL
Abs Immature Granulocytes: 0.02 10*3/uL (ref 0.00–0.07)
Basophils Absolute: 0.1 10*3/uL (ref 0.0–0.1)
Basophils Relative: 1 %
Eosinophils Absolute: 0.3 10*3/uL (ref 0.0–0.5)
Eosinophils Relative: 3 %
Immature Granulocytes: 0 %
Lymphocytes Relative: 26 %
Lymphs Abs: 2.5 10*3/uL (ref 0.7–4.0)
Monocytes Absolute: 0.6 10*3/uL (ref 0.1–1.0)
Monocytes Relative: 6 %
Neutro Abs: 6.1 10*3/uL (ref 1.7–7.7)
Neutrophils Relative %: 64 %

## 2023-05-02 LAB — BASIC METABOLIC PANEL
Anion gap: 12 (ref 5–15)
BUN: 10 mg/dL (ref 8–23)
CO2: 24 mmol/L (ref 22–32)
Calcium: 9 mg/dL (ref 8.9–10.3)
Chloride: 103 mmol/L (ref 98–111)
Creatinine, Ser: 1.17 mg/dL — ABNORMAL HIGH (ref 0.44–1.00)
GFR, Estimated: 52 mL/min — ABNORMAL LOW (ref >60)
Glucose, Bld: 99 mg/dL (ref 70–99)
Potassium: 3.3 mmol/L — ABNORMAL LOW (ref 3.5–5.1)
Sodium: 139 mmol/L (ref 135–145)

## 2023-05-02 MED ORDER — POTASSIUM CHLORIDE CRYS ER 20 MEQ PO TBCR
40.0000 meq | EXTENDED_RELEASE_TABLET | Freq: Two times a day (BID) | ORAL | Status: AC
  Administered 2023-05-02: 40 meq via ORAL
  Filled 2023-05-02 (×2): qty 2

## 2023-05-02 NOTE — H&P (View-Only) (Signed)
 Subjective: Patient seems to be more alert and oriented today and has answered all the questions appropriately..  She is has a history of previous CVA with right-sided deficits and microcytic anemia awaiting an EGD.  She denies having abdominal pain nausea vomiting.  She is tolerating her diet well.  Her hemoglobin is 10.3 g/dL today.  Objective: Vital signs in last 24 hours: Temp:  [98 F (36.7 C)-98.7 F (37.1 C)] 98 F (36.7 C) (01/06 0745) Pulse Rate:  [80-88] 80 (01/06 0745) Resp:  [18] 18 (01/06 0745) BP: (120-141)/(73-84) 138/82 (01/06 0745) SpO2:  [93 %-99 %] 93 % (01/06 0745) Last BM Date : 04/30/23  Intake/Output from previous day: No intake/output data recorded. Intake/Output this shift: No intake/output data recorded.  General appearance: alert, cooperative, fatigued, and no distress Resp: clear to auscultation bilaterally Cardio: regular rate and rhythm, S1, S2 normal, no murmur, click, rub or gallop GI: soft, non-tender; bowel sounds normal; no masses,  no organomegaly  Lab Results: Recent Labs    04/30/23 1130 05/02/23 0602  WBC 15.1* 9.5  HGB 8.8* 10.3*  HCT 27.4* 32.6*  PLT 265 323   BMET Recent Labs    04/30/23 1130 05/02/23 0602  NA 139 139  K 3.2* 3.3*  CL 106 103  CO2 23 24  GLUCOSE 126* 99  BUN 7* 10  CREATININE 0.95 1.17*  CALCIUM  9.0 9.0   LFT Recent Labs    04/30/23 1130  PROT 6.2*  ALBUMIN 2.9*  AST 18  ALT 9  ALKPHOS 55  BILITOT 0.7   Medications: I have reviewed the patient's current medications. Prior to Admission:  Medications Prior to Admission  Medication Sig Dispense Refill Last Dose/Taking   bisoprolol -hydrochlorothiazide  (ZIAC ) 10-6.25 MG tablet Take 1 tablet by mouth daily.   04/28/2023   chlorproMAZINE  (THORAZINE ) 100 MG tablet Take 100 mg by mouth at bedtime.   04/27/2023   diclofenac  Sodium (VOLTAREN ) 1 % GEL Apply 1 Application topically daily as needed (for pain).   Past Week   fluticasone  (FLONASE ) 50 MCG/ACT  nasal spray Place 1 spray into both nostrils daily as needed for allergies or rhinitis.   Past Month   lamoTRIgine  (LAMICTAL ) 150 MG tablet Take 300 mg by mouth daily.   04/27/2023   mirtazapine  (REMERON ) 15 MG tablet Take 15 mg by mouth at bedtime.   04/27/2023   naloxone  (NARCAN ) nasal spray 4 mg/0.1 mL Place 0.4 mg into the nose as needed (overdose.).   Taking As Needed   Oxycodone  HCl 10 MG TABS Take 10 mg by mouth in the morning and at bedtime.   04/27/2023   pantoprazole  (PROTONIX ) 40 MG tablet Take 40 mg by mouth daily.   04/27/2023   pregabalin  (LYRICA ) 50 MG capsule Take 50 mg by mouth 2 (two) times daily.   04/27/2023   rosuvastatin  (CRESTOR ) 10 MG tablet Take 10 mg by mouth daily.   04/28/2023   tiZANidine  (ZANAFLEX ) 4 MG tablet Take 4 mg by mouth 2 (two) times daily as needed for muscle spasms.   04/27/2023   APIXABAN  (ELIQUIS ) VTE STARTER PACK (10MG  AND 5MG ) Take as directed on package: start with two-5mg  tablets twice daily for 7 days. On day 8, switch to one-5mg  tablet twice daily. (Patient not taking: Reported on 04/29/2023) 74 tablet 0 Not Taking   chlorproMAZINE  (THORAZINE ) 200 MG tablet Take 200 mg by mouth at bedtime. (Patient not taking: Reported on 04/29/2023)   Not Taking   hydrOXYzine  (ATARAX ) 10 MG tablet Take  10 mg by mouth 3 (three) times daily as needed for anxiety. (Patient not taking: Reported on 04/29/2023)   Not Taking   pregabalin  (LYRICA ) 25 MG capsule Take 25 mg by mouth 2 (two) times daily. (Patient not taking: Reported on 04/29/2023)   Not Taking   Scheduled:  acetaminophen   1,000 mg Oral Q6H   bisoprolol   10 mg Oral Daily   And   hydrochlorothiazide   6.25 mg Oral Daily   diclofenac  Sodium  4 g Topical QID   lamoTRIgine   300 mg Oral Daily   lidocaine   2 patch Transdermal Q24H   pantoprazole   40 mg Oral Daily   potassium chloride   40 mEq Oral BID   rosuvastatin   10 mg Oral Daily   Continuous: PRN:  Assessment/Plan: 1) Acute on chronic iron deficiency anemia-EGD scheduled  for tomorrow 2) History of drug induced constipation/diverticulosis.  3) GERD-on PPI's. 4) Complex regional pain sndrome. 5) History of a CVA with right-sided deficits  LOS: 3 days   Renaye Sous 05/02/2023, 8:47 AM

## 2023-05-02 NOTE — Progress Notes (Signed)
 HD#3 SUBJECTIVE:  Patient Summary: Sierra Krueger is a 68 y.o. female with a pertinent PMH of CVA w/ right-sided deficits, microcytic anemia, chronic pain, complex regional pain syndrome, and HTN who presented with aphasia and right-sided weakness and is admitted for stroke rule out and acute microcytic anemia.   Overnight Events: None  Interim History: Patient evaluated at bedside. Her somnolence is much improved today after stopping the thorazine . She is alert and engaged in conversation, oriented. Feels good this morning. Denies any recent bleeding. No new concerns.   OBJECTIVE:  Vital Signs: Vitals:   05/01/23 2040 05/02/23 0056 05/02/23 0441 05/02/23 0745  BP: (!) 141/84 130/81 (!) 141/76 138/82  Pulse: 88 81 87 80  Resp:    18  Temp: 98.7 F (37.1 C) 98.4 F (36.9 C) 98 F (36.7 C) 98 F (36.7 C)  TempSrc: Oral Oral  Oral  SpO2: 99% 99% 94% 93%  Weight:       Supplemental O2: Room air  Physical Exam: General: sitting up in bed comfortably Cardiac: regular rate and rhythm, no murmurs Pulmonary: CTA bilaterally, normal effort and rate Abdomen: soft, non-tender, non-distended Neuro: alert and oriented x 3   Pertinent Labs:    Latest Ref Rng & Units 05/02/2023    6:02 AM 04/30/2023   11:30 AM 04/29/2023    8:15 AM  CBC  WBC 4.0 - 10.5 K/uL 9.5  15.1  13.9   Hemoglobin 12.0 - 15.0 g/dL 89.6  8.8  9.2   Hematocrit 36.0 - 46.0 % 32.6  27.4  29.3   Platelets 150 - 400 K/uL 323  265  217        Latest Ref Rng & Units 05/02/2023    6:02 AM 04/30/2023   11:30 AM 04/29/2023    8:15 AM  CMP  Glucose 70 - 99 mg/dL 99  873  887   BUN 8 - 23 mg/dL 10  7  8    Creatinine 0.44 - 1.00 mg/dL 8.82  9.04  9.06   Sodium 135 - 145 mmol/L 139  139  141   Potassium 3.5 - 5.1 mmol/L 3.3  3.2  3.8   Chloride 98 - 111 mmol/L 103  106  109   CO2 22 - 32 mmol/L 24  23  22    Calcium  8.9 - 10.3 mg/dL 9.0  9.0  8.7   Total Protein 6.5 - 8.1 g/dL  6.2    Total Bilirubin 0.0 - 1.2 mg/dL  0.7     Alkaline Phos 38 - 126 U/L  55    AST 15 - 41 U/L  18    ALT 0 - 44 U/L  9      ASSESSMENT/PLAN:  Assessment: Principal Problem:   Acute on chronic anemia Active Problems:   Essential hypertension   Chronic pain syndrome   Thyroid  nodule   Drug reaction  PMH of CVA w/ right-sided deficits, microcytic anemia, chronic pain, complex regional pain syndrome, and HTN who presented with aphasia and right-sided weakness and is admitted for stroke rule out and acute microcytic anemia on hospital day 4.   Plan: Somnolence, secondary to polypharmacy Patient's somnolence is much improved after stopping her medications. She says she has been taking her thorazine  and Lamotrigine  for over a year for sleep. We will keep holding her centrally acting medications today and will work on coming up with a better regimen for her at discharge.  - Centrally-acting medications held (chlorpromazine , hydroxyzine , pregabalin , oxycodone ) - Lamotrigine   continued; trough level pending - Dys 1 diet; will have SLP re-evaluate her today to advance her diet  Acute on chronic microcytic anemia Iron deficiency Hemoglobin 10.3 this morning, up from 8.8. GI following for workup. Source of bleeding not yet identified. No new bleeding.  - GI following; plan is potentially for an EGD tomorrow - Outpatient iron repletion  Hypokalemia Potassium 3.3 today. Repleted with 40 mEq potassium.   Chronic conditions: Chronic pain/Complex regional pain syndrome: Patient is on oxycodone , pregabalin , and tizanidine  for pain management outpatient.  We will continue to hold these medications for now.  Insomnia: She has been taking Lamictal  300 mg daily, hydroxyzine  as needed, and chlorpromazine  100 mg at bedtime outpatient. These are prescribed by her psychiatrist, Dr. Jerrell Forehand. We have continued Lamotrigine  but are holding Hydroxyzine  and chlorpromazine  in the setting of somnolence. HTN: Continuing home bisoprolol  10 mg daily,  hydrochlorothiazide  6.25 mg daily Hyperparathyroidism s/p parathyroidectomy (2021)  Best Practice: Diet: Dys 1; SLP following IVF: Fluids: None VTE: SCDs Start: 04/28/23 1341 Code: Full AB: None Therapy Recs: Pending DISPO: Anticipated discharge after GI workup.   Signature: Ozell Riff, MD  Internal Medicine Resident, PGY-1 [facility]  Pager: [phone] 12:01 PM, 05/02/2023   Please contact the on call pager after 5 pm and on weekends at [phone].

## 2023-05-02 NOTE — Plan of Care (Signed)

## 2023-05-02 NOTE — Progress Notes (Addendum)
 Subjective: Patient seems to be more alert and oriented today and has answered all the questions appropriately..  She is has a history of previous CVA with right-sided deficits and microcytic anemia awaiting an EGD.  She denies having abdominal pain nausea vomiting.  She is tolerating her diet well.  Her hemoglobin is 10.3 g/dL today.  Objective: Vital signs in last 24 hours: Temp:  [98 F (36.7 C)-98.7 F (37.1 C)] 98 F (36.7 C) (01/06 0745) Pulse Rate:  [80-88] 80 (01/06 0745) Resp:  [18] 18 (01/06 0745) BP: (120-141)/(73-84) 138/82 (01/06 0745) SpO2:  [93 %-99 %] 93 % (01/06 0745) Last BM Date : 04/30/23  Intake/Output from previous day: No intake/output data recorded. Intake/Output this shift: No intake/output data recorded.  General appearance: alert, cooperative, fatigued, and no distress Resp: clear to auscultation bilaterally Cardio: regular rate and rhythm, S1, S2 normal, no murmur, click, rub or gallop GI: soft, non-tender; bowel sounds normal; no masses,  no organomegaly  Lab Results: Recent Labs    04/30/23 1130 05/02/23 0602  WBC 15.1* 9.5  HGB 8.8* 10.3*  HCT 27.4* 32.6*  PLT 265 323   BMET Recent Labs    04/30/23 1130 05/02/23 0602  NA 139 139  K 3.2* 3.3*  CL 106 103  CO2 23 24  GLUCOSE 126* 99  BUN 7* 10  CREATININE 0.95 1.17*  CALCIUM  9.0 9.0   LFT Recent Labs    04/30/23 1130  PROT 6.2*  ALBUMIN 2.9*  AST 18  ALT 9  ALKPHOS 55  BILITOT 0.7   Medications: I have reviewed the patient's current medications. Prior to Admission:  Medications Prior to Admission  Medication Sig Dispense Refill Last Dose/Taking   bisoprolol -hydrochlorothiazide  (ZIAC ) 10-6.25 MG tablet Take 1 tablet by mouth daily.   04/28/2023   chlorproMAZINE  (THORAZINE ) 100 MG tablet Take 100 mg by mouth at bedtime.   04/27/2023   diclofenac  Sodium (VOLTAREN ) 1 % GEL Apply 1 Application topically daily as needed (for pain).   Past Week   fluticasone  (FLONASE ) 50 MCG/ACT  nasal spray Place 1 spray into both nostrils daily as needed for allergies or rhinitis.   Past Month   lamoTRIgine  (LAMICTAL ) 150 MG tablet Take 300 mg by mouth daily.   04/27/2023   mirtazapine  (REMERON ) 15 MG tablet Take 15 mg by mouth at bedtime.   04/27/2023   naloxone  (NARCAN ) nasal spray 4 mg/0.1 mL Place 0.4 mg into the nose as needed (overdose.).   Taking As Needed   Oxycodone  HCl 10 MG TABS Take 10 mg by mouth in the morning and at bedtime.   04/27/2023   pantoprazole  (PROTONIX ) 40 MG tablet Take 40 mg by mouth daily.   04/27/2023   pregabalin  (LYRICA ) 50 MG capsule Take 50 mg by mouth 2 (two) times daily.   04/27/2023   rosuvastatin  (CRESTOR ) 10 MG tablet Take 10 mg by mouth daily.   04/28/2023   tiZANidine  (ZANAFLEX ) 4 MG tablet Take 4 mg by mouth 2 (two) times daily as needed for muscle spasms.   04/27/2023   APIXABAN  (ELIQUIS ) VTE STARTER PACK (10MG  AND 5MG ) Take as directed on package: start with two-5mg  tablets twice daily for 7 days. On day 8, switch to one-5mg  tablet twice daily. (Patient not taking: Reported on 04/29/2023) 74 tablet 0 Not Taking   chlorproMAZINE  (THORAZINE ) 200 MG tablet Take 200 mg by mouth at bedtime. (Patient not taking: Reported on 04/29/2023)   Not Taking   hydrOXYzine  (ATARAX ) 10 MG tablet Take  10 mg by mouth 3 (three) times daily as needed for anxiety. (Patient not taking: Reported on 04/29/2023)   Not Taking   pregabalin  (LYRICA ) 25 MG capsule Take 25 mg by mouth 2 (two) times daily. (Patient not taking: Reported on 04/29/2023)   Not Taking   Scheduled:  acetaminophen   1,000 mg Oral Q6H   bisoprolol   10 mg Oral Daily   And   hydrochlorothiazide   6.25 mg Oral Daily   diclofenac  Sodium  4 g Topical QID   lamoTRIgine   300 mg Oral Daily   lidocaine   2 patch Transdermal Q24H   pantoprazole   40 mg Oral Daily   potassium chloride   40 mEq Oral BID   rosuvastatin   10 mg Oral Daily   Continuous: PRN:  Assessment/Plan: 1) Acute on chronic iron deficiency anemia-EGD scheduled  for tomorrow 2) History of drug induced constipation/diverticulosis.  3) GERD-on PPI's. 4) Complex regional pain sndrome. 5) History of a CVA with right-sided deficits  LOS: 3 days   Sierra Krueger 05/02/2023, 8:47 AM

## 2023-05-02 NOTE — Plan of Care (Signed)

## 2023-05-02 NOTE — Progress Notes (Signed)
 Speech Language Pathology Treatment: Dysphagia  Patient Details Name: Sierra Krueger MRN: [id number] DOB: 1955-11-07 Today's Date: 05/02/2023 Time: 1243-1300 SLP Time Calculation (min) (ACUTE ONLY): 17 min  Assessment / Plan / Recommendation Clinical Impression  Sierra Krueger is much more alert and interactive today. Her upper dentures have been removed due to gum pain; she politely declined replacing them for now due to the discomfort.  She accepted trials of cubed peaches, a few bites of crackers, and drank thin liquids with no further indications of dysphagia.  Her improved MS will allow her diet to be advanced to regular solids.  She selected some softer items from the tray for lunch and was pleased.  No further SLP intervention is needed for speech/cognition/swallowing. Our service will sign off. Pt agrees with plan.    HPI HPI: 68 y.o. female presented to [facility] ED as a code stroke for aphasia and right side weakness. 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 Work-up pending. CT head negative for acute event.      SLP Plan  All goals met      Recommendations for follow up therapy are one component of a multi-disciplinary discharge planning process, led by the attending physician.  Recommendations may be updated based on patient status, additional functional criteria and insurance authorization.    Recommendations  Diet recommendations: Regular;Thin liquid Liquids provided via: Cup;Straw Medication Administration: Whole meds with liquid Supervision: Patient able to self feed Postural Changes and/or Swallow Maneuvers: Seated upright 90 degrees                  Oral care BID   Frequent or constant Supervision/Assistance Dysphagia, unspecified (R13.10)     All goals met    Sierra Krueger L. Vona, MA CCC/SLP Clinical Specialist - Acute Care SLP Acute Rehabilitation Services Office number [phone]  Vona [name]  05/02/2023, 1:00 PM

## 2023-05-02 NOTE — Care Management Important Message (Signed)
 Important Message  Patient Details  Name: Sierra Krueger MRN: [id number] Date of Birth: 07-09-1955   Important Message Given:  Yes - Medicare IM     Dorena Bodo 05/02/2023, 2:55 PM

## 2023-05-03 ENCOUNTER — Inpatient Hospital Stay (HOSPITAL_COMMUNITY): Payer: 59 | Admitting: Certified Registered"

## 2023-05-03 ENCOUNTER — Encounter (HOSPITAL_COMMUNITY)
Admission: EM | Disposition: A | Discharge status: Home / Self Care | Payer: Self-pay | Source: Home / Self Care | Attending: Internal Medicine

## 2023-05-03 ENCOUNTER — Encounter (HOSPITAL_COMMUNITY): Payer: Self-pay | Admitting: Internal Medicine

## 2023-05-03 ENCOUNTER — Other Ambulatory Visit (HOSPITAL_COMMUNITY): Payer: Self-pay

## 2023-05-03 DIAGNOSIS — T50905A Adverse effect of unspecified drugs, medicaments and biological substances, initial encounter: Secondary | ICD-10-CM

## 2023-05-03 DIAGNOSIS — K449 Diaphragmatic hernia without obstruction or gangrene: Secondary | ICD-10-CM

## 2023-05-03 DIAGNOSIS — R400 Somnolence: Secondary | ICD-10-CM | POA: Diagnosis not present

## 2023-05-03 DIAGNOSIS — Z87891 Personal history of nicotine dependence: Secondary | ICD-10-CM

## 2023-05-03 DIAGNOSIS — K2970 Gastritis, unspecified, without bleeding: Secondary | ICD-10-CM | POA: Diagnosis not present

## 2023-05-03 DIAGNOSIS — I10 Essential (primary) hypertension: Secondary | ICD-10-CM

## 2023-05-03 DIAGNOSIS — D649 Anemia, unspecified: Secondary | ICD-10-CM | POA: Diagnosis not present

## 2023-05-03 DIAGNOSIS — F419 Anxiety disorder, unspecified: Secondary | ICD-10-CM

## 2023-05-03 HISTORY — PX: BIOPSY: SHX5522

## 2023-05-03 HISTORY — PX: ESOPHAGOGASTRODUODENOSCOPY: SHX5428

## 2023-05-03 LAB — BASIC METABOLIC PANEL
Anion gap: 12 (ref 5–15)
BUN: 10 mg/dL (ref 8–23)
CO2: 22 mmol/L (ref 22–32)
Calcium: 8.9 mg/dL (ref 8.9–10.3)
Chloride: 105 mmol/L (ref 98–111)
Creatinine, Ser: 1.1 mg/dL — ABNORMAL HIGH (ref 0.44–1.00)
GFR, Estimated: 56 mL/min — ABNORMAL LOW (ref >60)
Glucose, Bld: 85 mg/dL (ref 70–99)
Potassium: 3.1 mmol/L — ABNORMAL LOW (ref 3.5–5.1)
Sodium: 139 mmol/L (ref 135–145)

## 2023-05-03 LAB — CULTURE, BLOOD (ROUTINE X 2): Culture: NO GROWTH

## 2023-05-03 LAB — LAMOTRIGINE LEVEL: Lamotrigine Lvl: 18.1 ug/mL (ref 2.0–20.0)

## 2023-05-03 LAB — GLUCOSE, CAPILLARY: Glucose-Capillary: 87 mg/dL (ref 70–99)

## 2023-05-03 SURGERY — EGD (ESOPHAGOGASTRODUODENOSCOPY)
Anesthesia: Monitor Anesthesia Care

## 2023-05-03 MED ORDER — POTASSIUM CHLORIDE CRYS ER 20 MEQ PO TBCR
40.0000 meq | EXTENDED_RELEASE_TABLET | Freq: Two times a day (BID) | ORAL | Status: DC
  Administered 2023-05-03: 40 meq via ORAL
  Filled 2023-05-03: qty 2

## 2023-05-03 MED ORDER — PROPOFOL 10 MG/ML IV BOLUS
INTRAVENOUS | Status: DC | PRN
  Administered 2023-05-03: 60 mg via INTRAVENOUS

## 2023-05-03 MED ORDER — MAGNESIUM SULFATE 2 GM/50ML IV SOLN
2.0000 g | Freq: Once | INTRAVENOUS | Status: AC
  Administered 2023-05-03: 2 g via INTRAVENOUS
  Filled 2023-05-03: qty 50

## 2023-05-03 MED ORDER — PROPOFOL 500 MG/50ML IV EMUL
INTRAVENOUS | Status: DC | PRN
  Administered 2023-05-03: 100 ug/kg/min via INTRAVENOUS

## 2023-05-03 MED ORDER — SODIUM CHLORIDE 0.9 % IV SOLN: INTRAVENOUS | Status: DC | PRN

## 2023-05-03 MED ORDER — FERROUS SULFATE 325 (65 FE) MG PO TABS
325.0000 mg | ORAL_TABLET | ORAL | 2 refills | Status: AC
  Filled 2023-05-03: qty 15, 30d supply, fill #0

## 2023-05-03 NOTE — TOC Transition Note (Signed)
 Transition of Care [facility]) - Discharge Note   Patient Details  Name: Sierra Krueger MRN: [id number] Date of Birth: [birth year]  [facility]) CM/SW Contact:  Rosaline JONELLE Joe, RN Phone Number: 05/03/2023, 3:29 PM   Clinical Narrative:    CM met with the patient at the bedside to discuss TOC needs before returning home with her son, that she lives with.  Patient states that she has PCS Services through [facility] but was agreeable for [facility] services for PT.    I provided the patient with Medicare choice regarding home health services and she did not have a preference.  I called Baker, RNCM with [facility] and she accepted for [facility].  HH orders placed for MD team to co-sign.    Patient states that she already has a RW and does not need for home.  Discharge RN is at bedside to follow up regarding patient's discharge to home with family.         Patient Goals and CMS Choice            Discharge Placement                       Discharge Plan and Services Additional resources added to the After Visit Summary for                                       Social Drivers of Health (SDOH) Interventions SDOH Screenings   Food Insecurity: Patient Declined (04/28/2023)  Housing: Patient Declined (04/28/2023)  Transportation Needs: Patient Declined (04/28/2023)  Utilities: Patient Declined (04/28/2023)  Social Connections: Unknown (04/28/2023)  Tobacco Use: Medium Risk (04/28/2023)     Readmission Risk Interventions     No data to display

## 2023-05-03 NOTE — Plan of Care (Signed)

## 2023-05-03 NOTE — Anesthesia Preprocedure Evaluation (Addendum)
 Anesthesia Evaluation  Patient identified by MRN, date of birth, ID band Patient awake    Reviewed: Allergy & Precautions, NPO status , Patient's Chart, lab work & pertinent test results, reviewed documented beta blocker date and time   Airway Mallampati: III  TM Distance: >3 FB Neck ROM: Full    Dental  (+) Edentulous Upper, Dental Advisory Given   Pulmonary former smoker   Pulmonary exam normal breath sounds clear to auscultation       Cardiovascular hypertension, Pt. on home beta blockers and Pt. on medications Normal cardiovascular exam Rhythm:Regular Rate:Normal  TTE 2023 1. Left ventricular ejection fraction, by estimation, is 65 to 70%. The  left ventricle has normal function. The left ventricle has no regional  wall motion abnormalities. There is mild asymmetric left ventricular  hypertrophy of the basal-septal segment.  Left ventricular diastolic parameters are consistent with Grade I  diastolic dysfunction (impaired relaxation).   2. Right ventricular systolic function is normal. The right ventricular  size is normal. Tricuspid regurgitation signal is inadequate for assessing  PA pressure.   3. The mitral valve is normal in structure. Trivial mitral valve  regurgitation.   4. The aortic valve is tricuspid. Aortic valve regurgitation is not  visualized. No aortic stenosis is present.   5. The inferior vena cava is normal in size with greater than 50%  respiratory variability, suggesting right atrial pressure of 3 mmHg.     Neuro/Psych  PSYCHIATRIC DISORDERS Anxiety     CVA, Residual Symptoms    GI/Hepatic ,GERD  ,,(+)     substance abuse (chronic pain syndrome)    Endo/Other  negative endocrine ROS    Renal/GU Renal InsufficiencyRenal disease  negative genitourinary   Musculoskeletal negative musculoskeletal ROS (+)  narcotic dependent  Abdominal   Peds  Hematology negative hematology ROS (+)    Anesthesia Other Findings   Reproductive/Obstetrics                             Anesthesia Physical Anesthesia Plan  ASA: 3  Anesthesia Plan: MAC   Post-op Pain Management:    Induction: Intravenous  PONV Risk Score and Plan: Propofol  infusion and Treatment may vary due to age or medical condition  Airway Management Planned: Natural Airway  Additional Equipment:   Intra-op Plan:   Post-operative Plan:   Informed Consent: I have reviewed the patients History and Physical, chart, labs and discussed the procedure including the risks, benefits and alternatives for the proposed anesthesia with the patient or authorized representative who has indicated his/her understanding and acceptance.     Dental advisory given  Plan Discussed with: CRNA  Anesthesia Plan Comments:        Anesthesia Quick Evaluation

## 2023-05-03 NOTE — Anesthesia Postprocedure Evaluation (Signed)
 Anesthesia Post Note  Patient: Sierra Krueger  Procedure(s) Performed: ESOPHAGOGASTRODUODENOSCOPY (EGD) BIOPSY     Patient location during evaluation: PACU Anesthesia Type: MAC Level of consciousness: awake Pain management: pain level controlled Vital Signs Assessment: post-procedure vital signs reviewed and stable Respiratory status: spontaneous breathing, nonlabored ventilation and respiratory function stable Cardiovascular status: stable and blood pressure returned to baseline Postop Assessment: no apparent nausea or vomiting Anesthetic complications: no   No notable events documented.  Last Vitals:  Vitals:   05/03/23 1340 05/03/23 1350  BP: (!) 160/83 (!) 163/79  Pulse: 79 79  Resp: 20 (!) 26  Temp:    SpO2: 97% 97%    Last Pain:  Vitals:   05/03/23 1350  TempSrc:   PainSc: 0-No pain                 Delon Aisha Arch

## 2023-05-03 NOTE — Op Note (Signed)
 [facility] Patient Name: Sierra Krueger Procedure Date : 05/03/2023 MRN: [id number] Attending MD: Belvie Just , MD, [id number] Date of Birth: 14-Nov-1955 CSN: [id number] Age: 68 Admit Type: Inpatient Procedure:                Upper GI endoscopy Indications:              Iron deficiency anemia Providers:                Belvie Just, MD, Darleene Bare, RN, Coye                            Mbumina, Technician Referring MD:              Medicines:                Propofol  per Anesthesia Complications:            No immediate complications. Estimated Blood Loss:     Estimated blood loss: none. Procedure:                Pre-Anesthesia Assessment:                           - Prior to the procedure, a History and Physical                            was performed, and patient medications and                            allergies were reviewed. The patient's tolerance of                            previous anesthesia was also reviewed. The risks                            and benefits of the procedure and the sedation                            options and risks were discussed with the patient.                            All questions were answered, and informed consent                            was obtained. Prior Anticoagulants: The patient has                            taken Eliquis  (apixaban ), last dose was more than                            28 days prior to procedure. ASA Grade Assessment:                            III - A patient with severe systemic disease. After  reviewing the risks and benefits, the patient was                            deemed in satisfactory condition to undergo the                            procedure.                           - Sedation was administered by an anesthesia                            professional. Deep sedation was attained.                           After obtaining informed consent, the endoscope was                             passed under direct vision. Throughout the                            procedure, the patient's blood pressure, pulse, and                            oxygen saturations were monitored continuously. The                            [id number]-[id number] ([id number]) Olympus endoscope was introduced                            through the mouth, and advanced to the second part                            of duodenum. The upper GI endoscopy was                            accomplished without difficulty. The patient                            tolerated the procedure well. Scope In: Scope Out: Findings:      A 5 cm hiatal hernia was present.      Patchy moderately erythematous mucosa without bleeding was found in the       gastric antrum. Biopsies were taken with a cold forceps for Helicobacter       pylori testing.      The examined duodenum was normal.      At the junction of the antrum/body of the stomach there was the       possibility of a healed/healing gastric ulcer. Cold biopsies were       obtained. There was evidence of a large hiatal hernia and it may be a       paraesophageal hiatal hernia. If the patient is asymptomatic with       abdominal pain and/or GERD symptoms, no further work up is required at       this time. Impression:               -  5 cm hiatal hernia.                           - Erythematous mucosa in the antrum. Biopsied.                           - Normal examined duodenum. Recommendation:           - Return patient to hospital ward for ongoing care.                           - Resume previous diet.                           - Continue present medications.                           - Await pathology results.                           - Follow HGB and transfuse as necessary.                           - Maintain PPI.                           - Signing off. Follow up in the office in 2-4 weeks                            post discharge. Procedure Code(s):        ---  Professional ---                           [phone], Esophagogastroduodenoscopy, flexible,                            transoral; with biopsy, single or multiple Diagnosis Code(s):        --- Professional ---                           D50.9, Iron deficiency anemia, unspecified                           K44.9, Diaphragmatic hernia without obstruction or                            gangrene                           K31.89, Other diseases of stomach and duodenum CPT copyright 2022 American Medical Association. All rights reserved. The codes documented in this report are preliminary and upon coder review may  be revised to meet current compliance requirements. Belvie Just, MD Belvie Just, MD 05/03/2023 1:59:35 PM This report has been signed electronically. Number of Addenda: 0

## 2023-05-03 NOTE — Transfer of Care (Signed)
 Immediate Anesthesia Transfer of Care Note  Patient: Sierra Krueger  Procedure(s) Performed: ESOPHAGOGASTRODUODENOSCOPY (EGD) BIOPSY  Patient Location: PACU and Endoscopy Unit  Anesthesia Type:MAC  Level of Consciousness: awake, alert , and oriented  Airway & Oxygen Therapy: Patient Spontanous Breathing  Post-op Assessment: Report given to RN and Post -op Vital signs reviewed and stable  Post vital signs: Reviewed and stable  Last Vitals:  Vitals Value Taken Time  BP    Temp    Pulse    Resp    SpO2      Last Pain:  Vitals:   05/03/23 1235  TempSrc: Temporal  PainSc: 0-No pain         Complications: No notable events documented.

## 2023-05-03 NOTE — Progress Notes (Signed)
 DISCHARGE NOTE HOME Sierra Krueger to be discharged Home per MD order. Discussed prescriptions and follow up appointments with the patient. Prescriptions given to patient; medication list explained in detail. Patient verbalized understanding.  Skin clean, dry and intact without evidence of skin break down, no evidence of skin tears noted. IV catheter discontinued intact. Site without signs and symptoms of complications. Dressing and pressure applied. Pt denies pain at the site currently. No complaints noted.  Patient free of lines, drains, and wounds.   An After Visit Summary (AVS) was printed and given to the patient. Patient escorted via wheelchair, and discharged home via private auto.  Peyton [name], RN

## 2023-05-03 NOTE — Progress Notes (Signed)
 PT Cancellation Note  Patient Details Name: Sierra Krueger MRN: [id number] DOB: March 28, 1956   Cancelled Treatment:    Reason Eval/Treat Not Completed: Patient at procedure or test/unavailable  Currently off unit for test/procedure  Will check back later this afternoon as schedule permits.   [name], PT, DPT [facility] Physical Therapist Office: [phone] Website: [email or web address]  [name] 05/03/2023, 12:58 PM

## 2023-05-03 NOTE — Discharge Summary (Addendum)
 Name: Sierra Krueger MRN: [id number] DOB: 08-Feb-1956 68 y.o. PCP: Patient, No Pcp Per  Date of Admission: 04/28/2023 10:09 AM Date of Discharge:  05/03/2023 Attending Physician: Dr. CHARLENA Eastern  DISCHARGE DIAGNOSIS:  Primary Problem: Acute on chronic anemia   Hospital Problems: Principal Problem:   Acute on chronic anemia Active Problems:   Essential hypertension   Chronic pain syndrome   Thyroid  nodule   Drug reaction   Hiatal hernia   DISCHARGE MEDICATIONS:   Allergies as of 05/03/2023       Reactions   Codeine Hives   Morphine And Codeine Itching, Nausea And Vomiting   Penicillin G Other (See Comments)   Shellfish Allergy Hives, Swelling        Medication List     STOP taking these medications    chlorproMAZINE  100 MG tablet Commonly known as: THORAZINE    chlorproMAZINE  200 MG tablet Commonly known as: THORAZINE    Eliquis  DVT/PE Starter Pack Generic drug: Apixaban  Starter Pack (10mg  and 5mg )   hydrOXYzine  10 MG tablet Commonly known as: ATARAX    tiZANidine  4 MG tablet Commonly known as: ZANAFLEX        TAKE these medications    bisoprolol -hydrochlorothiazide  10-6.25 MG tablet Commonly known as: ZIAC  Take 1 tablet by mouth daily.   diclofenac  Sodium 1 % Gel Commonly known as: VOLTAREN  Apply 1 Application topically daily as needed (for pain).   ferrous sulfate  325 (65 FE) MG tablet Take 1 tablet (325 mg total) by mouth every other day.   fluticasone  50 MCG/ACT nasal spray Commonly known as: FLONASE  Place 1 spray into both nostrils daily as needed for allergies or rhinitis.   lamoTRIgine  150 MG tablet Commonly known as: LAMICTAL  Take 300 mg by mouth daily.   mirtazapine  15 MG tablet Commonly known as: REMERON  Take 15 mg by mouth at bedtime.   naloxone  4 MG/0.1ML Liqd nasal spray kit Commonly known as: NARCAN  Place 0.4 mg into the nose as needed (overdose.).   Oxycodone  HCl 10 MG Tabs Take 10 mg by mouth in the morning and at bedtime.    pantoprazole  40 MG tablet Commonly known as: PROTONIX  Take 40 mg by mouth daily.   pregabalin  25 MG capsule Commonly known as: LYRICA  Take 25 mg by mouth 2 (two) times daily.   pregabalin  50 MG capsule Commonly known as: LYRICA  Take 50 mg by mouth 2 (two) times daily.   rosuvastatin  10 MG tablet Commonly known as: CRESTOR  Take 10 mg by mouth daily.       DISPOSITION AND FOLLOW-UP:  [name] was discharged from [facility] in Fair condition. At the hospital follow up visit please address:  Altered mental status, secondary to polypharmacy: dramatically improved with cessation of Thorazine , tizanidine , and hydroxyzine . Recommend continuing to hold these medications. We continued the Lamotrigine  during her hospitalization, but it is unclear why she is on this medication. Recommend re-evaluating her medications outpatient and avoiding centrally-acting medications as much as possible.  Acute on chronic microcytic anemia: presented with severe microcytic anemia, requiring transfusions. Hemoglobin has remained stable over a few days at 9-10. Thought to be secondary to a GI bleed +/- iron deficiency. EGD showed a healing gastric ulcer; follow up path studies. Iron panel showed iron deficiency, recommend rechecking outpatient. Ensure she is taking her PPI and oral iron. She follows up in 2-4 weeks with GI.   Hypokalemia/hypomagnesemia: recommend rechecking these electrolytes at follow up visit.   Follow-up Appointments:  Follow-up Information     Masters,  [name], DO Follow up on 05/18/2023.   Specialty: Internal Medicine Why: Please go to your appointment on 05/18/2023 at 9:45am with Dr. Kenn. Contact information: [address] [place] [address] [phone]         [name], MD. Schedule an appointment as soon as possible for a visit.   Specialty: Gastroenterology Contact information: [address] [place]  [address] [phone]                HOSPITAL COURSE:  Patient Summary: Acute on chronic microcytic anemia Iron deficiency She presented with acute on chronic anemia with a hemoglobin of 5.7.  She was given 2 units of packed red blood cells. On presentation, she was somnolent and unable to give a great history, however when her somnolence improved she denied any recent hematochezia, melena, hematemesis, or increased vaginal bleeding.  GI was consulted who did an EGD.  On EGD, they found a healing gastric ulcer and diffuse erythema, but no active source of bleeding. They biopsied the ulcer. They also noted a hiatal hernia. During her hospitalization, she was also found to be iron deficient with a ferritin of 11, TSAT of 4, and iron of 14.  She was discharged with outpatient iron repletion and will need to be follows up outpatient.  Somnolence, secondary to polypharmacy She presented with extreme somnolence and was unable to participate in normal conversation.  An encephalopathic workup was done, which was unremarkable. Her somnolence was thought to be due to polypharmacy, given her plethora of centrally acting medications including chlorpromazine , hydroxyzine , pregabalin , oxycodone , and lamotrigine . It appears that these are being prescribed by her psychiatrist, Dr. Jerrell Forehand.  She states that these are being prescribed for her sleep and denies any prior seizure history or psychiatric disorders.  Per chart review, it looks like she has been on these medications for several years.  We held all of these medications except the lamotrigine  and her somnolence improved dramatically.   Hypokalemia Hypomagnesemia She had low potassium and magnesium  on presentation, which were both repleted over the course of her hospitalization. Her potassium remained low despite repletion, may benefit from further workup.    DISCHARGE INSTRUCTIONS:   Discharge Instructions     Call MD for:  difficulty  breathing, headache or visual disturbances   Complete by: As directed    Call MD for:  extreme fatigue   Complete by: As directed    Call MD for:  persistant dizziness or light-headedness   Complete by: As directed    Call MD for:  persistant nausea and vomiting   Complete by: As directed    Call MD for:  severe uncontrolled pain   Complete by: As directed    Diet - low sodium heart healthy   Complete by: As directed    Discharge instructions   Complete by: As directed    You were hospitalized for acute on chronic anemia and altered mental status. The medications that affect your cognition were held and your mental status improved. We have discontinued some of these medications. For your anemia, the GI doctors evaluated you and did an EGD procedure and they found a healing gastric ulcer, which they biopsied. They also identified a hiatal hernia. These can be followed up outpatient with your primary care doctor. Thank you for allowing us  to be part of your care.   We arranged for you to follow up at: the [facility], on the ground floor of this hospital,  on January 22nd at 9:45am with Dr. Kenn.   Please note these changes made to your medications:   Please START taking:  Oral iron tablets, once every other day, for treatment of your iron deficiency. I recommend taking this medication after a meal to avoid GI upset. Your primary care doctor will follow up on your iron levels, which are quite low currently.  Please STOP taking:   Thorazine  (Chlorpromazine ), Hydroxyzine  (atarax ), and Tizanidine . These medications were contributing to your sleepiness and confusion.   Please also stop taking Apixaban  (Eliquis ). It looks like you had already stopped this medication prior to your admission to the hospital.   You may continue taking your other medications as prescribed.   If you experience any lightheadedness/dizziness, shortness of breath, or notice any increased  blood in your stool or dark stools, please return to the emergency department for evaluation.   Please call our clinic if you have any questions or concerns, we may be able to help and keep you from a long and expensive emergency room wait. Our clinic and after hours phone number is [phone], the best time to call is Monday through Friday 9 am to 4 pm but there is always someone available 24/7 if you have an emergency. If you need medication refills please notify your pharmacy one week in advance and they will send us  a request.   Increase activity slowly   Complete by: As directed        SUBJECTIVE:   Patient is alert and oriented. Slept fine. No new concerns. Feels she is doing well overall.   Discharge Vitals:   BP (!) 163/79   Pulse 79   Temp 98.3 F (36.8 C) (Temporal)   Resp (!) 26   Ht 5' 5 (1.651 m)   Wt 74.4 kg   SpO2 97%   BMI 27.29 kg/m   OBJECTIVE:  Physical Exam Constitutional:      Appearance: Normal appearance.  Cardiovascular:     Rate and Rhythm: Normal rate and regular rhythm.     Pulses: Normal pulses.     Heart sounds: Normal heart sounds.  Pulmonary:     Effort: Pulmonary effort is normal.     Breath sounds: Normal breath sounds.  Abdominal:     General: Abdomen is flat.     Palpations: Abdomen is soft.  Neurological:     General: No focal deficit present.     Mental Status: She is alert and oriented to person, place, and time.     Pertinent Labs, Studies, and Procedures:     Latest Ref Rng & Units 05/02/2023    6:02 AM 04/30/2023   11:30 AM 04/29/2023    8:15 AM  CBC  WBC 4.0 - 10.5 K/uL 9.5  15.1  13.9   Hemoglobin 12.0 - 15.0 g/dL 89.6  8.8  9.2   Hematocrit 36.0 - 46.0 % 32.6  27.4  29.3   Platelets 150 - 400 K/uL 323  265  217        Latest Ref Rng & Units 05/03/2023    5:54 AM 05/02/2023    6:02 AM 04/30/2023   11:30 AM  CMP  Glucose 70 - 99 mg/dL 85  99  873   BUN 8 - 23 mg/dL 10  10  7    Creatinine 0.44 - 1.00 mg/dL 8.89  8.82   9.04   Sodium 135 - 145 mmol/L 139  139  139   Potassium 3.5 - 5.1 mmol/L  3.1  3.3  3.2   Chloride 98 - 111 mmol/L 105  103  106   CO2 22 - 32 mmol/L 22  24  23    Calcium  8.9 - 10.3 mg/dL 8.9  9.0  9.0   Total Protein 6.5 - 8.1 g/dL   6.2   Total Bilirubin 0.0 - 1.2 mg/dL   0.7   Alkaline Phos 38 - 126 U/L   55   AST 15 - 41 U/L   18   ALT 0 - 44 U/L   9     EEG adult Result Date: 04/29/2023 IMPRESSION: This study is suggestive of mild diffuse encephalopathy. No seizures or epileptiform discharges were seen throughout the recording. Arlin MALVA Krebs   MR BRAIN WO CONTRAST Result Date: 04/29/2023 CLINICAL DATA:  Neuro deficit with acute stroke suspected EXAM: MRI HEAD WITHOUT CONTRAST TECHNIQUE: Multiplanar, multiecho pulse sequences of the brain and surrounding structures were obtained without intravenous contrast. COMPARISON:  Head CT and CTA from yesterday FINDINGS: Brain: No acute infarction, hemorrhage, hydrocephalus, extra-axial collection or mass lesion. Large chronic infarct in the right occipital lobe with dense encephalomalacia. Small chronic infarcts in the bilateral cerebellum. Vascular: The major flow voids are preserved Skull and upper cervical spine: Cervical spine degeneration with C3-4 anterolisthesis. No focal marrow lesion. Sinuses/Orbits: No acute finding. IMPRESSION: 1. No acute finding. 2. 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 Electronically Signed   By: Dorn Roulette M.D.   On: 04/29/2023 04:35   CT Angio Chest Pulmonary Embolism (PE) W or WO Contrast Result Date: 04/28/2023 IMPRESSION: No definite evidence of large central pulmonary embolus seen in the main pulmonary artery or main portions of the left and right pulmonary arteries. However, there is limited opacification of the more distal branches of the pulmonary arteries, and therefore smaller and more peripheral pulmonary emboli cannot be excluded on the basis of this exam. 1.6 cm left  thyroid  nodule is noted which appears to be enlarged compared to prior exam. Recommend thyroid  US . (Ref: J Am Coll Radiol. 2015 Feb;12(2): 143-50). Large sliding-type hiatal hernia is noted with most of the stomach seen in the thoracic space. Mild bibasilar subsegmental atelectasis is noted. Electronically Signed   By: Lynwood Landy Raddle M.D.   On: 04/28/2023 11:53   CT ANGIO HEAD NECK W WO CM (CODE STROKE) Result Date: 04/28/2023 IMPRESSION: 1. No emergent finding. 2. Limited atheromatous change without irregularity or significant stenosis of major arteries in the head and neck. Electronically Signed   By: Dorn Roulette M.D.   On: 04/28/2023 10:29   CT HEAD CODE STROKE WO CONTRAST Result Date: 04/28/2023 IMPRESSION: 1. No acute finding. 2. Chronic right occipital parietal infarct. Electronically Signed   By: Dorn Roulette M.D.   On: 04/28/2023 10:16     Signed: Ozell Riff, MD Internal Medicine Resident, PGY-1 [facility]  Pager: [phone] 3:10 PM, 05/03/2023

## 2023-05-03 NOTE — Interval H&P Note (Signed)
 History and Physical Interval Note:  05/03/2023 1:03 PM  Sierra Krueger  has presented today for surgery, with the diagnosis of IDA.  The various methods of treatment have been discussed with the patient and family. After consideration of risks, benefits and other options for treatment, the patient has consented to  Procedure(s) with comments: ESOPHAGOGASTRODUODENOSCOPY (EGD) (N/A) - IDA as a surgical intervention.  The patient's history has been reviewed, patient examined, no change in status, stable for surgery.  I have reviewed the patient's chart and labs.  Questions were answered to the patient's satisfaction.     [name]

## 2023-05-03 NOTE — Plan of Care (Signed)

## 2023-05-04 LAB — CULTURE, BLOOD (ROUTINE X 2): Culture: NO GROWTH

## 2023-05-04 LAB — SURGICAL PATHOLOGY

## 2023-05-06 ENCOUNTER — Encounter (HOSPITAL_COMMUNITY): Payer: Self-pay | Admitting: Gastroenterology

## 2023-05-17 NOTE — Progress Notes (Signed)
Coding clarification  Patient had Acute hypoxic respiratory failure present on admission.

## 2023-05-17 NOTE — Progress Notes (Signed)
Coding clarification.  Patient has type 1 complex regional pain syndrome of the left foot.

## 2023-05-18 ENCOUNTER — Encounter: Payer: 59 | Admitting: Internal Medicine

## 2023-06-01 ENCOUNTER — Encounter: Payer: 59 | Admitting: Internal Medicine

## 2023-06-09 ENCOUNTER — Encounter: Payer: 59 | Admitting: Student

## 2023-06-20 ENCOUNTER — Encounter: Payer: 59 | Admitting: Student

## 2023-06-21 ENCOUNTER — Encounter: Payer: 59 | Admitting: Student

## 2024-02-17 ENCOUNTER — Other Ambulatory Visit: Payer: Self-pay

## 2024-02-17 ENCOUNTER — Emergency Department (HOSPITAL_COMMUNITY)
Admission: EM | Admit: 2024-02-17 | Discharge: 2024-02-18 | Disposition: A | Discharge status: Home / Self Care | Attending: Emergency Medicine | Admitting: Emergency Medicine

## 2024-02-17 DIAGNOSIS — I10 Essential (primary) hypertension: Secondary | ICD-10-CM | POA: Insufficient documentation

## 2024-02-17 DIAGNOSIS — Z79899 Other long term (current) drug therapy: Secondary | ICD-10-CM | POA: Diagnosis not present

## 2024-02-17 DIAGNOSIS — Z8673 Personal history of transient ischemic attack (TIA), and cerebral infarction without residual deficits: Secondary | ICD-10-CM | POA: Diagnosis not present

## 2024-02-17 DIAGNOSIS — J029 Acute pharyngitis, unspecified: Secondary | ICD-10-CM | POA: Insufficient documentation

## 2024-02-17 DIAGNOSIS — D649 Anemia, unspecified: Secondary | ICD-10-CM | POA: Insufficient documentation

## 2024-02-17 DIAGNOSIS — R142 Eructation: Secondary | ICD-10-CM | POA: Diagnosis not present

## 2024-02-17 LAB — COMPREHENSIVE METABOLIC PANEL WITH GFR
ALT: 8 U/L (ref 0–44)
AST: 17 U/L (ref 15–41)
Albumin: 3.8 g/dL (ref 3.5–5.0)
Alkaline Phosphatase: 66 U/L (ref 38–126)
Anion gap: 12 (ref 5–15)
BUN: 7 mg/dL — ABNORMAL LOW (ref 8–23)
CO2: 24 mmol/L (ref 22–32)
Calcium: 9.6 mg/dL (ref 8.9–10.3)
Chloride: 99 mmol/L (ref 98–111)
Creatinine, Ser: 1.37 mg/dL — ABNORMAL HIGH (ref 0.44–1.00)
GFR, Estimated: 43 mL/min — ABNORMAL LOW (ref >60)
Glucose, Bld: 110 mg/dL — ABNORMAL HIGH (ref 70–99)
Potassium: 4.3 mmol/L (ref 3.5–5.1)
Sodium: 135 mmol/L (ref 135–145)
Total Bilirubin: 0.2 mg/dL (ref 0.0–1.2)
Total Protein: 7.7 g/dL (ref 6.5–8.1)

## 2024-02-17 LAB — CBC WITH DIFFERENTIAL/PLATELET
Abs Immature Granulocytes: 0.02 K/uL (ref 0.00–0.07)
Basophils Absolute: 0 K/uL (ref 0.0–0.1)
Basophils Relative: 1 %
Eosinophils Absolute: 0.1 K/uL (ref 0.0–0.5)
Eosinophils Relative: 1 %
HCT: 36.3 % (ref 36.0–46.0)
Hemoglobin: 11.2 g/dL — ABNORMAL LOW (ref 12.0–15.0)
Immature Granulocytes: 0 %
Lymphocytes Relative: 37 %
Lymphs Abs: 2.1 K/uL (ref 0.7–4.0)
MCH: 26.4 pg (ref 26.0–34.0)
MCHC: 30.9 g/dL (ref 30.0–36.0)
MCV: 85.4 fL (ref 80.0–100.0)
Monocytes Absolute: 0.6 K/uL (ref 0.1–1.0)
Monocytes Relative: 11 %
Neutro Abs: 2.8 K/uL (ref 1.7–7.7)
Neutrophils Relative %: 50 %
Platelets: 355 K/uL (ref 150–400)
RBC: 4.25 MIL/uL (ref 3.87–5.11)
RDW: 15.8 % — ABNORMAL HIGH (ref 11.5–15.5)
WBC: 5.5 K/uL (ref 4.0–10.5)
nRBC: 0 % (ref 0.0–0.2)

## 2024-02-17 LAB — LIPASE, BLOOD: Lipase: 21 U/L (ref 11–51)

## 2024-02-17 NOTE — ED Notes (Signed)
 Provider to bedside to see and eval pt, MSE complete and provider okay pt to go to lobby at this time

## 2024-02-17 NOTE — ED Provider Triage Note (Signed)
 Emergency Medicine Provider Triage Evaluation Note  Sierra Krueger , a 68 y.o. female  was evaluated in triage.  Pt complains of generalized abdominal pain.  Reports eating bad food 1 week ago  Review of Systems  Positive: GI distress Negative: Shortness of breath  Physical Exam  BP (!) 140/95   Pulse 80   Temp 98.1 F (36.7 C) (Oral)   SpO2 94%  Gen:   Awake, no distress   Resp:  Normal effort  MSK:   Moves extremities without difficulty  Other:  Abdomen slightly distended  Medical Decision Making  Medically screening exam initiated at 12:49 PM.  Appropriate orders placed.  Sierra Krueger was informed that the remainder of the evaluation will be completed by another provider, this initial triage assessment does not replace that evaluation, and the importance of remaining in the ED until their evaluation is complete.     Sierra Mclaurin, DO 02/17/24 1250

## 2024-02-17 NOTE — ED Triage Notes (Signed)
 Pt arrived via WC to ED with CC abd discomfort states  I ate chinese food a week ago and feel like there is gas. VSS. Hx HTN CVA with left arm deficits. Denies

## 2024-02-18 ENCOUNTER — Emergency Department (HOSPITAL_COMMUNITY)

## 2024-02-18 DIAGNOSIS — J029 Acute pharyngitis, unspecified: Secondary | ICD-10-CM | POA: Diagnosis not present

## 2024-02-18 LAB — URINALYSIS, ROUTINE W REFLEX MICROSCOPIC
Bilirubin Urine: NEGATIVE
Glucose, UA: NEGATIVE mg/dL
Hgb urine dipstick: NEGATIVE
Ketones, ur: NEGATIVE mg/dL
Nitrite: NEGATIVE
Protein, ur: NEGATIVE mg/dL
Specific Gravity, Urine: 1.006 (ref 1.005–1.030)
pH: 8 (ref 5.0–8.0)

## 2024-02-18 LAB — TROPONIN I (HIGH SENSITIVITY): Troponin I (High Sensitivity): 4 ng/L (ref <18)

## 2024-02-18 MED ORDER — FAMOTIDINE 40 MG PO TABS
40.0000 mg | ORAL_TABLET | Freq: Every day | ORAL | 0 refills | Status: AC
Start: 2024-02-18 — End: ?

## 2024-02-18 MED ORDER — PANTOPRAZOLE SODIUM 20 MG PO TBEC: 20.0000 mg | DELAYED_RELEASE_TABLET | Freq: Every day | ORAL | 0 refills | Status: DC

## 2024-02-18 NOTE — ED Provider Notes (Incomplete)
 68 year old female with history of hypertension, stroke, chronic kidney disease who is complaining to me of her throat hurting for the past week but apparently had complained at triage about abdominal discomfort and gas.  She denies this to me.  On exam, oropharynx appears normal without erythema or exudate or swelling.  ED workup is benign.

## 2024-02-18 NOTE — ED Provider Notes (Signed)
 [facility] Provider Note   CSN: [id number] Arrival date & time: 02/17/24  1240     Patient presents with: Abdominal Pain   Sierra Krueger is a 68 y.o. female who presents with increased belching, mild discomfort in her lower throat x 1 week, persisting. NO difficulty swallowing, no fevers or chills. No hx of same. No abdominal pain, N/V/D, or changes in urination. ON pantoprazole  daily for GERD.  Accompanied by her daughter at the bedside.   Patient also has hx of PE, HTN, anemia, Chronic pain syndrome, and CVA with residual left sided weakness. Not currently anticoagulated. Denies CP.   HPI     Prior to Admission medications   Medication Sig Start Date End Date Taking? Authorizing Provider  famotidine (PEPCID) 40 MG tablet Take 1 tablet (40 mg total) by mouth daily. 02/18/24  Yes Anevay Campanella R, PA-C  bisoprolol -hydrochlorothiazide  (ZIAC ) 10-6.25 MG tablet Take 1 tablet by mouth daily. 04/05/23   [provider]  diclofenac  Sodium (VOLTAREN ) 1 % GEL Apply 1 Application topically daily as needed (for pain). 09/29/21   [provider]  ferrous sulfate  325 (65 FE) MG tablet Take 1 tablet (325 mg total) by mouth every other day. 05/03/23 08/01/23  Gregary Sharper, MD  fluticasone  (FLONASE ) 50 MCG/ACT nasal spray Place 1 spray into both nostrils daily as needed for allergies or rhinitis.    [provider]  lamoTRIgine  (LAMICTAL ) 150 MG tablet Take 300 mg by mouth daily. 10/07/21   [provider]  mirtazapine  (REMERON ) 15 MG tablet Take 15 mg by mouth at bedtime. 10/08/19   [provider]  naloxone  (NARCAN ) nasal spray 4 mg/0.1 mL Place 0.4 mg into the nose as needed (overdose.). 04/18/23   [provider]  Oxycodone  HCl 10 MG TABS Take 10 mg by mouth in the morning and at bedtime.    [provider]  pregabalin  (LYRICA ) 25 MG capsule Take 25 mg by mouth 2 (two) times daily. Patient  not taking: Reported on 04/29/2023 05/21/19   [provider]  pregabalin  (LYRICA ) 50 MG capsule Take 50 mg by mouth 2 (two) times daily. 04/21/23   [provider]  rosuvastatin  (CRESTOR ) 10 MG tablet Take 10 mg by mouth daily. 10/22/21   [provider]    Allergies: Codeine, Morphine and codeine, Penicillin g, and Shellfish allergy    Review of Systems  Constitutional: Negative.   HENT:  Positive for sore throat. Negative for trouble swallowing.   Respiratory: Negative.    Cardiovascular: Negative.   Gastrointestinal: Negative.     Updated Vital Signs BP 136/88   Pulse 77   Temp 97.9 F (36.6 C)   Resp 18   SpO2 100%   Physical Exam Vitals and nursing note reviewed.  Constitutional:      Appearance: She is not ill-appearing or toxic-appearing.  HENT:     Head: Normocephalic and atraumatic.     Mouth/Throat:     Mouth: Mucous membranes are moist.     Pharynx: Oropharynx is clear. Uvula midline. No oropharyngeal exudate or posterior oropharyngeal erythema.     Comments: Sublingual and submental TTP.  Eyes:     General:        Right eye: No discharge.        Left eye: No discharge.     Conjunctiva/sclera: Conjunctivae normal.  Neck:     Trachea: Trachea and phonation normal.  Cardiovascular:     Rate and Rhythm:  Normal rate and regular rhythm.     Pulses: Normal pulses.  Pulmonary:     Effort: Pulmonary effort is normal. No respiratory distress.     Breath sounds: Normal breath sounds. No wheezing or rales.  Abdominal:     General: Bowel sounds are normal. There is no distension.     Palpations: Abdomen is soft.     Tenderness: There is no abdominal tenderness.  Musculoskeletal:        General: No deformity.     Cervical back: Normal range of motion and neck supple.  Lymphadenopathy:     Cervical: No cervical adenopathy.  Skin:    General: Skin is warm and dry.  Neurological:     Mental Status: She is alert. Mental status is at  baseline.  Psychiatric:        Mood and Affect: Mood normal.     (all labs ordered are listed, but only abnormal results are displayed) Labs Reviewed  COMPREHENSIVE METABOLIC PANEL WITH GFR - Abnormal; Notable for the following components:      Result Value   Glucose, Bld 110 (*)    BUN 7 (*)    Creatinine, Ser 1.37 (*)    GFR, Estimated 43 (*)    All other components within normal limits  CBC WITH DIFFERENTIAL/PLATELET - Abnormal; Notable for the following components:   Hemoglobin 11.2 (*)    RDW 15.8 (*)    All other components within normal limits  URINALYSIS, ROUTINE W REFLEX MICROSCOPIC - Abnormal; Notable for the following components:   Color, Urine STRAW (*)    Leukocytes,Ua MODERATE (*)    Bacteria, UA RARE (*)    All other components within normal limits  URINE CULTURE  LIPASE, BLOOD  TROPONIN I (HIGH SENSITIVITY)    EKG: EKG Interpretation Date/Time:  Saturday February 18 2024 03:08:45 EDT Ventricular Rate:  85 PR Interval:  186 QRS Duration:  81 QT Interval:  372 QTC Calculation: 443 R Axis:   38  Text Interpretation: Sinus rhythm Probable left atrial enlargement Anteroseptal infarct, age indeterminate When compared with ECG of 1/2/22025, T wave abnormality is no longer present Confirmed by Raford Lenis ([id number]) on 02/18/2024 3:11:18 AM  Radiology: DG Chest 2 View Result Date: 02/18/2024 EXAM: 2 VIEW(S) XRAY OF THE CHEST 02/18/2024 02:35:58 AM COMPARISON: 11/21/2021 CLINICAL HISTORY: sob. FINDINGS: LUNGS AND PLEURA: No focal pulmonary opacity. No pulmonary edema. No pleural effusion. No pneumothorax. HEART AND MEDIASTINUM: Large hiatal hernia. No acute abnormality of the cardiac and mediastinal silhouettes. BONES AND SOFT TISSUES: No acute osseous abnormality. IMPRESSION: 1. No acute cardiopulmonary process. Electronically signed by: Pinkie Pebbles MD 02/18/2024 02:38 AM EDT RP Workstation: [id number]     Procedures   Medications Ordered in the ED - No  data to display                                  Medical Decision Making 68 y/o female with sore thraot, sensation that there is something stuck in her throat.   Vital signs reassuring on intake.  Cardiopulmonary abdominal exams are benign.  Oropharyngeal exam and anterior neck exam are unremarkable.  Patient is very well-appearing.  Amount and/or Complexity of Data Reviewed Labs:     Details: CBC with mild anemia hemoglobin 11 near patient's baseline.  CMP with creatinine of 1.3 near patient's baseline of 1.1.  Troponin is negative, UA not concerning for infection this time.  Radiology: ordered.    Details:  Chest x-ray is negative for acute cardiopulmonary disease ECG/medicine tests:     Details: EKG with sinus rhythm.  Risk Prescription drug management.   Clinical concern for emergent underlying etiology of this patient's symptoms that would warrant further ED workup or inpatient management is exceedingly low.  Soreness of throat and increased belching, suggest likely exacerbation of her reflux, will add on famotidine to her pantoprazole  daily and recommend close outpatient follow-up with GI.  No indication for further workup or hospitalization at this time.  Brinn and her daughter  Mackensey voiced understanding of her medical evaluation and treatment plan. Each of their questions answered to their expressed satisfaction.  Return precautions were given.  Patient is well-appearing, stable, and was discharged in good condition. This chart was dictated using voice recognition software, Dragon. Despite the best efforts of this provider to proofread and correct errors, errors may still occur which can change documentation meaning.     Final diagnoses:  Sore throat  Belching    ED Discharge Orders          Ordered    pantoprazole  (PROTONIX ) 20 MG tablet  Daily,   Status:  Discontinued        02/18/24 0505    famotidine (PEPCID) 40 MG tablet  Daily        02/18/24 0511                Lestat Golob, Pleasant SAUNDERS, PA-C 02/19/24 0610    Raford Lenis, MD 02/19/24 [phone]

## 2024-02-18 NOTE — Discharge Instructions (Addendum)
 You are seen in the ER today for the soreness in her throat.  Your physical exam was reassuring as were your testing.  While the exact cause of your discomfort is not clear, it may be contributed to by acid reflux.  Been prescribed to his medication to take to treat this reflux and follow-up with your primary care doctor.  Return to the ER with any severe symptoms.

## 2024-02-19 LAB — URINE CULTURE
